# Patient Record
Sex: Male | Born: 1990 | Race: Black or African American | Hispanic: No | Marital: Single | Smoking: Current every day smoker
Health system: Southern US, Community
[De-identification: ages and names within clinical notes are randomized; demographics above are authoritative.]

## PROBLEM LIST (undated history)

## (undated) DIAGNOSIS — F319 Bipolar disorder, unspecified: Secondary | ICD-10-CM

## (undated) DIAGNOSIS — I10 Essential (primary) hypertension: Secondary | ICD-10-CM

## (undated) DIAGNOSIS — Z8619 Personal history of other infectious and parasitic diseases: Secondary | ICD-10-CM

## (undated) DIAGNOSIS — T7840XA Allergy, unspecified, initial encounter: Secondary | ICD-10-CM

## (undated) DIAGNOSIS — K219 Gastro-esophageal reflux disease without esophagitis: Secondary | ICD-10-CM

## (undated) DIAGNOSIS — F909 Attention-deficit hyperactivity disorder, unspecified type: Secondary | ICD-10-CM

## (undated) DIAGNOSIS — Z889 Allergy status to unspecified drugs, medicaments and biological substances status: Secondary | ICD-10-CM

## (undated) DIAGNOSIS — J45909 Unspecified asthma, uncomplicated: Secondary | ICD-10-CM

## (undated) HISTORY — DX: Bipolar disorder, unspecified: F31.9

## (undated) HISTORY — DX: Personal history of other infectious and parasitic diseases: Z86.19

## (undated) HISTORY — DX: Gastro-esophageal reflux disease without esophagitis: K21.9

## (undated) HISTORY — PX: ESOPHAGOGASTRODUODENOSCOPY: SHX1529

## (undated) HISTORY — DX: Unspecified asthma, uncomplicated: J45.909

## (undated) HISTORY — PX: TONSILLECTOMY AND ADENOIDECTOMY: SUR1326

## (undated) HISTORY — DX: Allergy, unspecified, initial encounter: T78.40XA

---
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
## 2010-01-21 ENCOUNTER — Emergency Department (HOSPITAL_COMMUNITY)
Admission: EM | Admit: 2010-01-21 | Discharge: 2010-01-22 | Payer: Self-pay | Source: Home / Self Care | Admitting: Emergency Medicine

## 2010-02-14 ENCOUNTER — Encounter: Payer: Self-pay | Admitting: Urology

## 2010-03-15 ENCOUNTER — Other Ambulatory Visit: Payer: Self-pay | Admitting: Otolaryngology

## 2010-03-15 ENCOUNTER — Ambulatory Visit (HOSPITAL_BASED_OUTPATIENT_CLINIC_OR_DEPARTMENT_OTHER)
Admission: RE | Admit: 2010-03-15 | Discharge: 2010-03-16 | Disposition: A | Discharge status: Home / Self Care | Payer: Medicaid Other | Source: Ambulatory Visit | Attending: Otolaryngology | Admitting: Otolaryngology

## 2010-03-15 DIAGNOSIS — J3501 Chronic tonsillitis: Secondary | ICD-10-CM | POA: Insufficient documentation

## 2010-03-15 LAB — POCT HEMOGLOBIN-HEMACUE: Hemoglobin: 13.5 g/dL (ref 13.0–17.0)

## 2010-03-16 NOTE — Op Note (Signed)
  Stephen Frank, COPE              ACCOUNT NO.:  [id number]  MEDICAL RECORD NO.:  [id number]           PATIENT TYPE:  LOCATION:                                 FACILITY:  PHYSICIAN:  Khyri Hinzman H. Pollyann Kennedy, MD     DATE OF BIRTH:  07-Dec-1990  DATE OF PROCEDURE:  03/15/2010 DATE OF DISCHARGE:                              OPERATIVE REPORT   PREOPERATIVE DIAGNOSIS:  Chronic tonsillitis.  POSTOPERATIVE DIAGNOSIS:  Chronic tonsillitis.  PROCEDURE:  Tonsillectomy.  SURGEON:  Kaytlen Lightsey H. Pollyann Kennedy, MD  General endotracheal anesthesia was used.  No complications.  BLOOD LOSS:  Minimal.  FINDINGS:  Very large inflamed tonsils with cryptic inflammation and severe enlargement.  SPECIMENS:  Tonsils sent bilaterally for pathologic evaluation.  HISTORY:  This is a 20 year old with a history of chronic tonsillitis. Risks, benefits, alternatives, complications of the procedure were explained to the patient and his mother who seemed to understand and agreed to surgery.  PROCEDURE:  The patient was taken to the operating room, placed on the operating table in supine position.  Following induction of general endotracheal anesthesia, the table was turned.  The patient was draped in a standard fashion.  Crowe-Davis mouthgag was inserted into the oral cavity, used to retract the tongue and mandible, attached to [employer]. Inspection of the palate revealed no evidence of abnormality.  Red rubber catheter was inserted into the right side of nose, withdrawn through the mouth and used to retract the soft palate and uvula. Tonsillectomy was performed using electrocautery dissection carefully dissecting the relatively avascular plane between the capsule and constrictor muscles.  This plane was difficult to identify in some areas, but with slow dissection I was able to avoid any significant bleeding.  The tonsils were sent together for pathologic evaluation.  Suction cautery was used to complete hemostasis.   The pharynx was irrigated with saline and suctioned.  An orogastric tube used to aspirate the contents of the stomach.  The patient was awakened, extubated, and transferred to recovery in stable condition.     Laretha Luepke H. Pollyann Kennedy, MD     JHR/MEDQ  D:  03/15/2010  T:  03/15/2010  Job:  [id number]  Electronically Signed by Serena Colonel MD on 03/16/2010 10:51:50 AM

## 2010-04-05 LAB — POCT I-STAT, CHEM 8
BUN: 8 mg/dL (ref 6–23)
Calcium, Ion: 1.08 mmol/L — ABNORMAL LOW (ref 1.12–1.32)
Chloride: 107 mEq/L (ref 96–112)
Creatinine, Ser: 1 mg/dL (ref 0.4–1.5)
Glucose, Bld: 94 mg/dL (ref 70–99)
HCT: 40 % (ref 39.0–52.0)
Hemoglobin: 13.6 g/dL (ref 13.0–17.0)
Potassium: 3.6 mEq/L (ref 3.5–5.1)
Sodium: 144 meq/L (ref 135–145)
TCO2: 28 mmol/L (ref 0–100)

## 2010-04-05 LAB — DIFFERENTIAL
Lymphs Abs: 3.3 10*3/uL (ref 0.7–4.0)
Monocytes Absolute: 0.6 10*3/uL (ref 0.1–1.0)
Monocytes Relative: 9 % (ref 3–12)

## 2010-04-05 LAB — CBC
HCT: 38.5 % — ABNORMAL LOW (ref 39.0–52.0)
Hemoglobin: 12.9 g/dL — ABNORMAL LOW (ref 13.0–17.0)
MCV: 84.2 fL (ref 78.0–100.0)
RBC: 4.57 MIL/uL (ref 4.22–5.81)
RDW: 12.9 % (ref 11.5–15.5)

## 2010-04-05 LAB — RAPID STREP SCREEN (MED CTR MEBANE ONLY): Streptococcus, Group A Screen (Direct): NEGATIVE

## 2010-04-26 LAB — POCT I-STAT, CHEM 8
BUN: 6 mg/dL (ref 6–23)
Calcium, Ion: 1.14 mmol/L (ref 1.12–1.32)
Chloride: 106 mEq/L (ref 96–112)
Creatinine, Ser: 0.8 mg/dL (ref 0.4–1.5)
Glucose, Bld: 94 mg/dL (ref 70–99)
HCT: 41 % (ref 39.0–52.0)
Hemoglobin: 13.9 g/dL (ref 13.0–17.0)
Sodium: 143 mEq/L (ref 135–145)

## 2010-06-11 NOTE — H&P (Signed)
NAME:  Stephen Frank, Stephen Frank                        ACCOUNT NO.:  [id number]   MEDICAL RECORD NO.:  [id number]                   PATIENT TYPE:  INP   LOCATION:  0699                                 FACILITY:  [facility]   PHYSICIAN:  Beverly Milch, MD                  DATE OF BIRTH:  [birth year]   DATE OF ADMISSION:  01/02/2003  DATE OF DISCHARGE:                         PSYCHIATRIC ADMISSION ASSESSMENT   PATIENT IDENTIFICATION:  This 20-year-old male seventh at [employer] is admitted voluntarily emergently as brought by his DSS  worker for inpatient stabilization of suicidal ideation and command auditory  hallucinations for the same.  He was suspended from school for three days,  apparently walking out of class and violating probation and still has legal  charges pending for fighting to be undertaken in court.  The patient is  under the care of [facility] and Dr. Idalia Needle.  He has completed a taper of Abilify, lithium, and Adderall XR as  of January 01, 2003, in preparation for the patient to enter [employer].  He apparently has to be medication free for a period time to enter  the camp.  Psychosocial coordination of such understanding with [facility] is undertaken as the patient's purpose may be to change this.   HISTORY OF PRESENT ILLNESS:  This is apparently the first psychiatric  hospitalization for the patient who has had extensive outpatient treatment  at [facility].  He has been on pharmacotherapy since February 19, 2001,  reportedly restarting Zyprexa on that date, according to records received.  The patient has taken multiple medications as mother and social worker  outline at the time of admission.  He has received, in the past, Risperdal,  Zyprexa, and Abilify with Abilify up to 15 mg daily and Zyprexa 10 mg.  The  patient has received mood stabilizers including Depakote, lithium, and  Topamax.  He has also  received Effexor, Zoloft, and Adderall.  I cannot  determine that he has had Strattera, Prozac, Seroquel, Concerta, or Provigil  though records may only be partially provided.  At the time of presentation,  the patient is somewhat obsessively driven by parents' anxiety to figure  things out in the environment and to get them done.  The patient does not  interpret the reason but seems to need to do so to be comfortable.  He and  mother otherwise provide little elaboration on more chronic symptoms.  They  do note that he was suspended from school on the day of admission and they  report that he had a knife with a plan to cut himself.  He also voiced  suicidal ideation and reported auditory hallucinations telling him to kill  himself and do bad things.  He seemed to report most of these extreme  symptoms prior to arrival.  DSS confirms that they have custody.  The  patient was taken from biological mother approximately one year ago after  neglect and placed with grandmother.  However, despite the placement, the  patient has been to juvenile apparently approximately a week ago.  He is  referred with the diagnosis of conduct disorder.  The patient offers little  spontaneous elaboration on symptoms, reasons, or mechanisms.  The patient  will not clarify his legal status completely but apparently he has legal  charges still to be processed in court for fighting and is on probation for  the same.  He has apparently violated his probation by being suspended from  school, which apparently was for walking out of class though he is also  disrespectful to the teachers frequently and fights frequently.  The patient  makes comments such as he acts out when he gets nervous or school is  stressful.  Though he does not follow directions, they do not prevent cruel  or complete failure of empathy behaviors.  They note that grounding and  privileges being removed are ways they use at home to contain the  patient.  He does like sports and pets.  The patient acts out at school as well as at  home and he may tear things up sometimes but not frequently.  The patient  does not acknowledge any substance use including no cigarettes.  His  therapist is Risa Grill.  His psychiatrist is Dr. Idalia Needle.  His Arts development officer with DSS is Control and instrumentation engineer.  His most recent Abilify dose was 15  mg nightly before the taper while lithium was 600 mg twice daily and  Adderall was 30 mg XR daily.  Dr. Jasmine Awe office confirms by phone and fax  that the patient needs to be off of his medications for starting [facility] and they prefer that he not be restarted on any medications at this  time as conduct disorder is his primary diagnosis.   PAST MEDICAL HISTORY:  The patient has a history of allergic rhinitis and  asthma.  He does take Clarinex 5 mg every morning, albuterol three puffs  b.i.d. p.r.n., and Advair 100/50 mg daily.  He also takes a  multivitamin/multimineral daily.  The patient is overweight.  He is allergic  to soybeans and pecans.  He has no medication allergies.  He has no history  of syncope or definite head trauma though he states he might get a seizure  if he tries to take a cold shower here.  He denies chest pain, cough, or  syncope.   REVIEW OF SYSTEMS:  The patient denies difficulty with gait, gaze, or  continence.  He denies exposure to communicable disease or toxins.  He  denies rash, jaundice, or purpura currently.  He is overweight.  He denies  abdominal pain, nausea, vomiting, or diarrhea.  There is no dysuria or  arthralgia.   Immunizations are up-to-date.   PHYSICAL EXAMINATION:  VITAL SIGNS:  Height is 59.75 inches and weight 138  pounds.  Blood pressure is 122/79 with heart rate 94 supine and standing  blood pressure is 129/70 with heart rate 96.  NEUROLOGIC:  The patient is alert and oriented with gait and gaze.  Cranial nerves appear intact.  Reflexes, muscle  strength and tone, and sensory exam  seem intact.  There are no abnormal involuntarily movements.   FAMILY HISTORY:  They do not provide significant family history.  They do  note that the patient was removed from mother's custody one year ago by DSS  and apparently placed with grandmother.  The patient apparently had to go to  juvenile jail approximately one to two weeks ago.  They do acknowledge that  he has been disrespectful and relatively assaultive.  He acts out often at  times that he is stressed or nervous.  He does not acknowledge definite  posttraumatic flashbacks but he does seem perfectionistic and fixated.  He  does not acknowledge using cigarettes, alcohol, or illicit drugs.  He does  not acknowledge sexual activity.   MENTAL STATUS EXAM:  The patient is moderately inattentive and seems  ritualistic and rigid in his interpersonal and activities of daily living  style.  He seems perfectionistic in his need to ask for directions and then  to appraise the environment around him.  He tends to assume a legalistic  posture, particularly with peers.  He is not angry or irritable at this time  but is dependent and fixated.  He appears to have comorbid ADHD symptoms and  anxiety disorder with OCD features.  Oppositional defiance or conduct  disorder is also evident though it is difficult to clarify objectively  descriptively at this time.  The patient does not present entrenched  sociopathy.  He does have some capacity for empathy though he is rigid in  style.  He does not acknowledge hallucinations to me but has been reporting  auditory hallucinations, particularly telling him to do something bad or  hurt himself.  These seem to arise in obsessive fixations and disruptive  behavior.  He is in a class for behavioral and emotional disorders.  He has  had active suicidal ideation including reportedly having a knife.  He has  been assaultive at times but is not homicidal.   ADMISSION  DIAGNOSES:   AXIS I:  1. 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  2. 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  3. Attention-deficit hyperactivity disorder, combined type, moderate to     severe.  4. Parent-child problem.  5. Other specified family circumstances.  6. Other interpersonal problems.  7. Acutely completed medication taper.   AXIS II:  Diagnosis deferred.   AXIS III:  1. Allergic rhinitis and asthma.  2. Overweight.  3. Sensitive to pecans and soybeans.   AXIS IV:  Stressors: Family- severe to extreme, acute and chronic; school-  severe, acute and chronic.   AXIS V:  Global assessment of functioning at the time of admission 44 with  highest global assessment of functioning in the last year 56.   ASSETS AND STRENGTHS:  The patient's perfectionism seems an asset for  inroads into his defiant behavior.  INITIAL PLAN OF CARE:  The patient is admitted for inpatient child  psychiatric and multidisciplinary multimodal behavioral health treatment in  the team based program at a locked psychiatric unit.  Cognitive behavioral,  anger management, family, and exposure/response prevention therapies can be  undertaken.  Options for pharmacotherapy would include Strattera, Seroquel,  and Prozac if appropriate.  However, if the patient is to be off of  pharmacotherapy and to prepare for [facility], medications will not be  started.  Will assess for symptom stabilization at this time.   ESTIMATED LENGTH OF STAY:  Five to seven days.   CONDITIONS NECESSARY FOR DISCHARGE:  Target symptoms for discharge include  stabilization of suicide risk and any command hallucinations, stabilization  of disruptive behavior and obsessive anxiety, and generalization of capacity  for safe, effective participation  in subsequent treatment.                                               Beverly Milch, MD    GJ/MEDQ  D:  01/03/2003  T:  01/03/2003  Job:  [id number]

## 2010-06-11 NOTE — Discharge Summary (Signed)
NAME:  Stephen Frank, Stephen Frank                        ACCOUNT NO.:  [id number]   MEDICAL RECORD NO.:  [id number]                   PATIENT TYPE:  INP   LOCATION:  0600                                 FACILITY:  [facility]   PHYSICIAN:  Beverly Milch, MD                  DATE OF BIRTH:  15-Sep-1990   DATE OF ADMISSION:  01/02/2003  DATE OF DISCHARGE:  01/10/2003                                 DISCHARGE SUMMARY   IDENTIFICATION:  20-year-old male, 7th grade student at [employer] was admitted voluntarily as brought by his DSS worker for  inpatient stabilization of suicidal ideation and command auditory  hallucinations. He had been suspended from school for 3 days for walking out  of class and violating his probation.  His repeated failure in his programs  for containment of his disruptive behavior that resulted in withdrawal of  all of his medications at [facility] in anticipation of  starting a wilderness camp program.  The patient has had significant  outpatient treatment over time at [facility] and has been  under the care for medication management with Dr. Idalia Needle.  For full  details please see the typed admission assessment.   SYNOPSIS OF PRESENT ILLNESS:  The patient had been recently discontinued by  taper from Abilify, lithium and Adderall XR as of January 01, 2003.  The  patient was otherwise sincere or fully mindful of the approaching wilderness  camp.  The patient tends to be significantly anxious and obsessive including  in his denial.  He had received multiple medications in the past, including  Risperdal, Zyprexa, Depakote, Topamax, Effexor, Zoloft, and those  medications from admission including Abilify, lithium and Adderall.  The  patient did not definitely receive Strattera, Prozac, Seroquel, Concerta or  Provigil.  The patient was suspended from school apparently on the day of  admission and was hospitalized  reporting that he had a knife with a plan to  cut himself.  He reported suicidal ideation with command auditory  hallucinations telling him to kill himself and do bad things.  He is  apparently under the custody of DSS, currently staying with grandmother  after he was removed from the custody of mother.  The patient has a  diagnosis of conduct disorder though he does not have a repertoire of  delinquent behaviors as much as he has repeated disruptions of the efforts  of others to help him, as well as every day rules at school and in the  community.  He fights frequently at school though generally for the course  of simple arguments.  His DSS social worker is Control and instrumentation engineer and his  therapist if Risa Grill.  He has allergic rhinitis and asthma for which he  takes Clarinex, albuterol and Advair.  He is overweight.  He had to go to  juvenile jail 1-2 weeks ago and has  been disrespectful and assaultive at  times.  He states he acts out when he is stressed or nervous.  He does not  acknowledge definite post-traumatic flashbacks.  He has no substance use.   INITIAL MENTAL STATUS EXAM:  The patient was moderately inattentive and  seemed ritualistically rigid in his interpersonal style, including that of  becoming argumentative.  He was always checking and double checking from an  almost legalistic posture, particularly with peers.  He appeared to have co  morbid ADHD and anxiety symptoms, though not meeting full criteria for  obsessive-compulsive disorder.  He had more oppositional and defiant than  conduct disorder features.  He did have empathy for others though he is  rigid in his application of such.  He had no further hallucinations after  his admission, these seeming to arise as part of his obsessive fixations and  disruptiveness.  He was not homicidal.   LABORATORY FINDINGS:  CBC on admission was normal except he had slight  lymphocytosis with 55% lymphocytes and 33% neutrophils and his  eosinophils  were slightly elevated at 6 with upper limit of normal 5%.  White count was  normal at 5,800, hemoglobin 12.3, MCV 82 and platelet count 303,000.  Basic  metabolic panel was normal, with sodium 139, potassium 4, glucose 97,  creatinine 0.8, and calcium 10.  Hepatic function panel was normal, with  SGOT 27, SGPT 28, GGT 23 and albumin 4.  Urine drug screen was negative.  Urinalysis was normal with specific gravity 1.016.   HOSPITAL COURSE AND TREATMENT:  General medical exam on admission by [facility], PA-C noted that the patient was significantly overweight.  He  had no allergies.  He does use multiple medications as mentioned above for  asthma.  He had an old scar on the left wrist and right knee and reported  some headaches associated with allergies.  Main risk factor was being  overweight.  Vital signs were stable throughout hospital stay.  Admission  weight was 138 pounds with height of 59-3/4 inches, blood pressure 122/79  with heart rate of 94 supine and standing blood pressure 129/70 with heart  rate 96.  Final weight was 130 pounds.  Final blood pressure was 110/60 with  heart rate of 93 supine and 108/61 with heart rate of 122 standing.  Psychosocial coordination of the patient's inpatient treatment, particularly  pharmacologically with the underway outpatient was attempted in every way  possible.  His medications were therefore not restructured for ADHD and  anxiety, though he did receive some p.r.n. medications on several occasions,  including Zyprexa 10 mg as the Zydis formulation and Seroquel 100-200 mg.  His total medication dose during the hospital stay p.r.n. was 20 mg of  Zyprexa Zydis and 400 mg of Seroquel.  He received no scheduled medications  including at the time of discharge.  Behavioral therapy was carried out  among other therapies, including group, milieu, family, individual, special education, occupational and therapeutic recreational  therapies and anger  management.  Final family session included mother, grandmother and Vance Peper.  There will be a treatment planning meeting the following week to  finalize decisions about next steps in the patient's treatment.  The patient  addressed repeatedly during his hospital stay the needs for the wilderness  camp treatment, but also the treatable elements of his obsessive anxiety and  ADHD.  The patient addressed in the final case conference what he had  learned and his specific goals and  how he could reach them.  The patient  made a significant improvement in the final 36-48 hours of his hospital  stay.  He became more sincere and less competitive in his disruptiveness.  The patient was discharged in improved condition.  He had no further  suicidal ideation.  His anger management and family themes were reworked  repeatedly during his hospital stay.   FINAL DIAGNOSES:  AXIS 1:  1. Oppositional-defiant disorder.  2. Anxiety disorder not otherwise specified, with obsessive-compulsive and     probable post-traumatic features.  3. Attention deficit hyperactivity disorder, combined type, moderate to     severe.  4. Parent-child problem.  5. Other specified family circumstances.  6. Other interpersonal problems.  7. Acutely completed medication taper.  AXIS II:  Diagnosis deferred.  AXIS III:  1. Allergic rhinitis and asthma.  2. Overweight.  3. Sensitive to pecans and soybeans.  AXIS IV:  Stressors:  Family - severe to extreme, acute and chronic; school - severe,  acute and chronic.  AXIS V:  Global assessment of function on admission 44 with highest in last year 56  and discharge global assessment of function was 52.   PLAN:  The patient was making progress at the time of discharge.  He was  motivated to work with DSS and school.  He did receive his asthma and  allergy medications during his hospital stay, including be provided a  Nasonex spray.  He was discharged on  the following medications:  1. Albuterol inhaler 2 puffs every 4 hours as needed for asthma.  2. Advair 100/50 to use 1 puff twice daily.  3. Clarinex 5 mg every morning.  4. Nasonex 2 sprays each nostril every morning.  5. Multivitamin multimineral, 1 daily.  6. Hydrocortisone cream can be used on patch of nummular eczema of the right     distal lateral thigh.  The patient was not discharged on any psychotropic medications.  Prozac and  Concerta may help his obsessive anxiety and ADHD if he does not have to go  to the wilderness camp for his oppositional-defiance.  Crisis and safety  plans are established if needed.  He will see Risa Grill at [employer] [facility] for  [facility] January 11, 2003 at 0900 for therapy.                                               Beverly Milch, MD    GJ/MEDQ  D:  01/13/2003  T:  01/13/2003  Job:  [id number]   cc:   Attn:  [facility]  [address] [place], [place] [address]   Al Decant. Marlou Porch, M.D.

## 2010-06-11 NOTE — Op Note (Signed)
Stephen Frank, WOOLSEY              ACCOUNT NO.:  [id number]   MEDICAL RECORD NO.:  [id number]          PATIENT TYPE:  AMB   LOCATION:  SDS                          FACILITY:  [facility]   PHYSICIAN:  Jefry H. Pollyann Kennedy, MD     DATE OF BIRTH:  [birth year]   DATE OF PROCEDURE:  02/25/2005  DATE OF DISCHARGE:                                 OPERATIVE REPORT   PREOPERATIVE DIAGNOSIS:  Hyperplasia of the adenoid with obstruction.   POSTOPERATIVE DIAGNOSIS:  Hyperplasia of the adenoid with obstruction.   PROCEDURE:  Adenoidectomy.   SURGEON:  Jefry H. Pollyann Kennedy, MD.   General endotracheal anesthesia was used.  No complications.   BLOOD LOSS:  40 mL.   No complications.   FINDINGS:  Severe enlargement of the adenoid with complete obstruction of  the nasopharynx.  Mild to moderate enlargement of the tonsil with cryptic  debris present.   REFERRING PHYSICIAN:  Linward Headland, M.D.   HISTORY:  A 20 year old with a history of severe snoring and nasal  obstruction.  Risks, benefits, alternatives, complications of the procedure  were explained to the mother, she understands and agrees to surgery.   PROCEDURE:  The patient was taken to the operating room and placed on the  operating table in the supine position.  Following induction of general  endotracheal anesthesia, the table was turned.  The patient was draped in  the standard fashion.  A Crowe-Davis mouthgag was inserted into the oral  cavity, used to retract the tongue and mandible and attach the Mayo stand.  Inspection of the palate revealed no evidence of a submucous cleft or  shortening of the soft palate.  Indirect examination of the nasopharynx was  performed and a large adenoid curet was used in multiple passes to remove  the majority of the adenoid tissue.  The nasopharynx was packed for several  minutes for hemostasis.  The packing was removed and suction cautery was  used to obliterate additional lymphoid  tissue and to provide  hemostasis.  The pharynx was suctioned of blood and  secretions, irrigated with saline solution and the orogastric tube was used  to aspirate the contents of the stomach.  Patient was then awakened,  extubated and transferred to recovery in stable condition.      Jefry H. Pollyann Kennedy, MD  Electronically Signed     JHR/MEDQ  D:  02/25/2005  T:  02/25/2005  Job:  [id number]   cc:   Linward Headland, M.D.  Fax: [phone]

## 2010-10-17 ENCOUNTER — Emergency Department (HOSPITAL_COMMUNITY): Payer: No Typology Code available for payment source

## 2010-10-17 ENCOUNTER — Emergency Department (HOSPITAL_COMMUNITY)
Admission: EM | Admit: 2010-10-17 | Discharge: 2010-10-17 | Disposition: A | Discharge status: Home / Self Care | Payer: No Typology Code available for payment source | Attending: Emergency Medicine | Admitting: Emergency Medicine

## 2010-10-17 ENCOUNTER — Encounter (HOSPITAL_COMMUNITY): Payer: Self-pay

## 2010-10-17 DIAGNOSIS — M549 Dorsalgia, unspecified: Secondary | ICD-10-CM | POA: Insufficient documentation

## 2010-10-17 DIAGNOSIS — J45909 Unspecified asthma, uncomplicated: Secondary | ICD-10-CM | POA: Insufficient documentation

## 2010-10-17 DIAGNOSIS — Y92410 Unspecified street and highway as the place of occurrence of the external cause: Secondary | ICD-10-CM | POA: Insufficient documentation

## 2010-10-17 DIAGNOSIS — Y992 Volunteer activity: Secondary | ICD-10-CM | POA: Insufficient documentation

## 2010-10-17 DIAGNOSIS — R4182 Altered mental status, unspecified: Secondary | ICD-10-CM | POA: Insufficient documentation

## 2010-10-17 DIAGNOSIS — F909 Attention-deficit hyperactivity disorder, unspecified type: Secondary | ICD-10-CM | POA: Insufficient documentation

## 2010-10-17 DIAGNOSIS — T07XXXA Unspecified multiple injuries, initial encounter: Secondary | ICD-10-CM | POA: Insufficient documentation

## 2010-10-17 HISTORY — DX: Allergy status to unspecified drugs, medicaments and biological substances: Z88.9

## 2010-10-17 HISTORY — DX: Attention-deficit hyperactivity disorder, unspecified type: F90.9

## 2010-10-17 LAB — COMPREHENSIVE METABOLIC PANEL
ALT: 27 U/L (ref 0–53)
AST: 20 U/L (ref 0–37)
Albumin: 4 g/dL (ref 3.5–5.2)
Alkaline Phosphatase: 95 U/L (ref 39–117)
Calcium: 9 mg/dL (ref 8.4–10.5)
GFR calc Af Amer: >60 mL/min (ref >60)
Glucose, Bld: 103 mg/dL — ABNORMAL HIGH (ref 70–99)
Potassium: 3.6 mEq/L (ref 3.5–5.1)
Sodium: 142 mEq/L (ref 135–145)
Total Protein: 7.1 g/dL (ref 6.0–8.3)

## 2010-10-17 LAB — PROTIME-INR
INR: 1 (ref 0.00–1.49)
Prothrombin Time: 13.4 seconds (ref 11.6–15.2)

## 2010-10-17 LAB — URINALYSIS, ROUTINE W REFLEX MICROSCOPIC
Bilirubin Urine: NEGATIVE
Glucose, UA: NEGATIVE mg/dL
Ketones, ur: NEGATIVE mg/dL
Protein, ur: NEGATIVE mg/dL
pH: 6 (ref 5.0–8.0)

## 2010-10-17 LAB — CBC
MCH: 28.8 pg (ref 26.0–34.0)
MCHC: 33.9 g/dL (ref 30.0–36.0)
MCV: 84.9 fL (ref 78.0–100.0)

## 2010-10-17 LAB — RAPID URINE DRUG SCREEN, HOSP PERFORMED
Amphetamines: NOT DETECTED
Barbiturates: NOT DETECTED
Benzodiazepines: NOT DETECTED
Tetrahydrocannabinol: POSITIVE — AB

## 2010-10-17 LAB — LACTIC ACID, PLASMA: Lactic Acid, Venous: 1 mmol/L (ref 0.5–2.2)

## 2010-10-17 MED ORDER — IOHEXOL 300 MG/ML  SOLN
100.0000 mL | Freq: Once | INTRAMUSCULAR | Status: AC | PRN
  Administered 2010-10-17: 100 mL via INTRAVENOUS

## 2010-10-20 ENCOUNTER — Emergency Department (HOSPITAL_COMMUNITY)
Admission: EM | Admit: 2010-10-20 | Discharge: 2010-10-20 | Disposition: A | Discharge status: Home / Self Care | Payer: Medicaid Other | Attending: Emergency Medicine | Admitting: Emergency Medicine

## 2010-10-20 DIAGNOSIS — S139XXA Sprain of joints and ligaments of unspecified parts of neck, initial encounter: Secondary | ICD-10-CM | POA: Insufficient documentation

## 2010-10-20 DIAGNOSIS — F909 Attention-deficit hyperactivity disorder, unspecified type: Secondary | ICD-10-CM | POA: Insufficient documentation

## 2010-10-20 DIAGNOSIS — R51 Headache: Secondary | ICD-10-CM | POA: Insufficient documentation

## 2010-10-20 DIAGNOSIS — M542 Cervicalgia: Secondary | ICD-10-CM | POA: Insufficient documentation

## 2010-10-20 DIAGNOSIS — J45909 Unspecified asthma, uncomplicated: Secondary | ICD-10-CM | POA: Insufficient documentation

## 2011-03-06 ENCOUNTER — Encounter (HOSPITAL_COMMUNITY): Payer: Self-pay

## 2011-03-06 ENCOUNTER — Emergency Department (INDEPENDENT_AMBULATORY_CARE_PROVIDER_SITE_OTHER)
Admission: EM | Admit: 2011-03-06 | Discharge: 2011-03-06 | Disposition: A | Discharge status: Home / Self Care | Payer: Medicaid Other | Source: Home / Self Care | Attending: Family Medicine | Admitting: Family Medicine

## 2011-03-06 DIAGNOSIS — Z720 Tobacco use: Secondary | ICD-10-CM

## 2011-03-06 DIAGNOSIS — J40 Bronchitis, not specified as acute or chronic: Secondary | ICD-10-CM

## 2011-03-06 DIAGNOSIS — J410 Simple chronic bronchitis: Secondary | ICD-10-CM

## 2011-03-06 DIAGNOSIS — J329 Chronic sinusitis, unspecified: Secondary | ICD-10-CM

## 2011-03-06 MED ORDER — DOXYCYCLINE HYCLATE 100 MG PO CAPS: 100.0000 mg | ORAL_CAPSULE | Freq: Two times a day (BID) | ORAL | Status: AC

## 2011-03-06 NOTE — ED Notes (Signed)
Pt states he has had a sore throat for 2 weeks, states tongue and glands feel swollen , denies fever/n/v

## 2011-03-06 NOTE — ED Provider Notes (Signed)
History     CSN: [id number]  Arrival date & time 03/06/11  1108   First MD Initiated Contact with Patient 03/06/11 1220      Chief Complaint  Patient presents with  . Sore Throat    sore throat, swollen glands for 2 weeks    (Consider location/radiation/quality/duration/timing/severity/associated sxs/prior treatment) Patient is a 21 y.o. male presenting with pharyngitis. The history is provided by the patient.  Sore Throat This is a new problem. The current episode started more than 1 week ago (2 week ). The problem has been gradually worsening. Pertinent negatives include no chest pain, no abdominal pain and no shortness of breath. Associated symptoms comments: Smoker, , productive of phlegm.. The symptoms are aggravated by swallowing.    Past Medical History  Diagnosis Date  . Asthma   . History of seasonal allergies   . ADHD (attention deficit hyperactivity disorder)     Past Surgical History  Procedure Date  . Tonsillectomy     History reviewed. No pertinent family history.  History  Substance Use Topics  . Smoking status: Not on file  . Smokeless tobacco: Not on file  . Alcohol Use:       Review of Systems  Constitutional: Negative.   HENT: Positive for congestion, sore throat, rhinorrhea and postnasal drip.   Respiratory: Positive for cough. Negative for shortness of breath.   Cardiovascular: Negative for chest pain.  Gastrointestinal: Negative for abdominal pain.  Skin: Negative.     Allergies  Review of patient's allergies indicates no known allergies.  Home Medications   Current Outpatient Rx  Name Route Sig Dispense Refill  . DOXYCYCLINE HYCLATE 100 MG PO CAPS Oral Take 1 capsule (100 mg total) by mouth 2 (two) times daily. 30 capsule 0    BP 139/85  Pulse 78  Temp(Src) 98.3 F (36.8 C) (Oral)  Resp 16  SpO2 99%  Physical Exam  Nursing note and vitals reviewed. Constitutional: He is oriented to person, place, and time. He appears  well-developed and well-nourished.  HENT:  Head: Normocephalic.  Right Ear: External ear normal.  Left Ear: External ear normal.  Nose: Nose normal.  Mouth/Throat: Oropharynx is clear and moist.  Eyes: Pupils are equal, round, and reactive to light.  Neck: Normal range of motion. Neck supple.  Cardiovascular: Normal rate, normal heart sounds and intact distal pulses.   Pulmonary/Chest: He has rhonchi.  Lymphadenopathy:    He has cervical adenopathy.  Neurological: He is alert and oriented to person, place, and time.  Skin: Skin is warm and dry.    ED Course  Procedures (including critical care time)   Labs Reviewed  POCT RAPID STREP A ([facility] CARE ONLY)   No results found.   1. Sinusitis   2. Bronchitis due to tobacco use       MDM          Barkley Bruns, MD 03/06/11 [phone]

## 2011-12-14 ENCOUNTER — Encounter: Payer: Self-pay | Admitting: Family Medicine

## 2011-12-14 ENCOUNTER — Ambulatory Visit (INDEPENDENT_AMBULATORY_CARE_PROVIDER_SITE_OTHER): Payer: Medicaid Other | Admitting: Family Medicine

## 2011-12-14 ENCOUNTER — Telehealth: Payer: Self-pay | Admitting: Family Medicine

## 2011-12-14 ENCOUNTER — Ambulatory Visit: Payer: Medicaid Other | Admitting: Family Medicine

## 2011-12-14 VITALS — BP 163/89 | HR 82 | Temp 98.3°F | Ht 71.0 in | Wt 231.0 lb

## 2011-12-14 DIAGNOSIS — F319 Bipolar disorder, unspecified: Secondary | ICD-10-CM | POA: Insufficient documentation

## 2011-12-14 DIAGNOSIS — J029 Acute pharyngitis, unspecified: Secondary | ICD-10-CM | POA: Insufficient documentation

## 2011-12-14 MED ORDER — GUAIFENESIN-CODEINE 100-10 MG/5ML PO SYRP: 10.0000 mL | ORAL_SOLUTION | Freq: Three times a day (TID) | ORAL | Status: DC | PRN

## 2011-12-14 MED ORDER — ACETAMINOPHEN-DM 1000-30 MG/30ML PO LIQD: 15.0000 mL | Freq: Three times a day (TID) | ORAL | Status: DC | PRN

## 2011-12-14 NOTE — Patient Instructions (Addendum)
It was nice to meet you today, Stephen Frank. For sore throat, please pick up medication at your pharmacy. Purchase over the counter CEPACOL cough drops/lozenges to reduce throat pain. If you develop fever (temp > 101), chills, nausea/vomiting, or worsening sore throat, please return to clinic.  Sore Throat A sore throat is felt inside the throat and at the back of the mouth. It hurts to swallow or the throat may feel dry and scratchy. It can be caused by germs, smoking, pollution, or allergies.  HOME CARE   Only take medicine as told by your doctor.  Drink enough fluids to keep your pee (urine) clear or pale yellow.  Eat soft foods.  Do not smoke.  Rinse the mouth (gargle) with warm water or salt water ( teaspoon salt in 8 ounces of water).  Try throat sprays, lozenges, or suck on hard candy. GET HELP RIGHT AWAY IF:   You have trouble breathing.  Your sore throat lasts longer than 1 week.  There is more puffiness (swelling) in the throat.  The pain is so bad that you are unable to swallow.  You have a very bad headache or a red rash.  You start to throw up (vomit).  You or your child has a temperature by mouth above 102 F (38.9 C), not controlled by medicine.  Your baby is older than 3 months with a rectal temperature of 102 F (38.9 C) or higher.  Your baby is 31 months old or younger with a rectal temperature of 100.4 F (38 C) or higher. MAKE SURE YOU:   Understand these instructions.  Will watch your condition.  Will get help right away if you are not doing well or get worse. Document Released: 10/20/2007 Document Revised: 04/04/2011 Document Reviewed: 10/20/2007 [facility] Patient Information 2013 [place], [place].

## 2011-12-14 NOTE — Telephone Encounter (Signed)
Patient is calling because he went to [facility] on [address] to pick up his Rx, but they said it wasn't there yet.  I asked him to give it another hour and check back again.

## 2011-12-14 NOTE — Assessment & Plan Note (Signed)
Likely early viral URI - afebrile in office today. - Treat symptoms with Robitussin with codeine, Cepacol lozenges, rest and fluids - Red flags reviewed with patient and per AVS

## 2011-12-14 NOTE — Progress Notes (Signed)
  Subjective:    Patient ID: Stephen Frank, male    DOB: 06-12-1990, 21 y.o.   MRN: [id number]  HPI  Patient presents to clinic to establish care.  He was previously seen by a pediatrician until one year ago.  Today, patient complains of sore throat.  Sore throat: Patient has had sore throat x 2 days.  He slept with the window open 2 nights ago and thinks this may have caused symptoms.  Complains of pain with swallowing only and burning back of tongue.  Denies any rhinorrhea, cough, fever, chills, or NS.  Denies any SOB or CP.  Patient has not tried anything yet for pain.  Of note, he has had both tonsils and adenoids removed.  Hx of bipolar disorder:  Patient was diagnosed with Bipolar disorder when he was in elementary school.  He used to take Abilify, but he stopped taking this about 2 years ago.  He also used to take Adderral for ADHD, but he stopped this 2 years ago as well.  Patient used to see a psychiatrist (or psychologist) back then but has stopped going since he has been out of school.  Patient denies any recent mood changes.  He says he has been doing well off medication.  Does admit to [facility] use.  Denies family hx of bipolar DO.  I have reviewed and update patient's PMH, SH, Allergies, Medications, FH, and Problem List.  Review of Systems  Per HPI    Objective:   Physical Exam  Constitutional: No distress.  HENT:  Head: Normocephalic and atraumatic.  Mouth/Throat: No oropharyngeal exudate.  Neck:       Tender lymph node on RT  Cardiovascular: Normal rate.   Pulmonary/Chest: Effort normal and breath sounds normal. He has no wheezes. He has no rales.        Assessment & Plan:

## 2011-12-14 NOTE — Assessment & Plan Note (Signed)
Patient has not been taking medications in about 2 years.  Will discuss psych issues at follow up appointment.

## 2011-12-16 ENCOUNTER — Ambulatory Visit (INDEPENDENT_AMBULATORY_CARE_PROVIDER_SITE_OTHER): Payer: Medicaid Other | Admitting: Family Medicine

## 2011-12-16 ENCOUNTER — Encounter: Payer: Self-pay | Admitting: Family Medicine

## 2011-12-16 ENCOUNTER — Other Ambulatory Visit (HOSPITAL_COMMUNITY)
Admission: RE | Admit: 2011-12-16 | Discharge: 2011-12-16 | Disposition: A | Discharge status: Home / Self Care | Payer: Medicaid Other | Source: Ambulatory Visit | Attending: Family Medicine | Admitting: Family Medicine

## 2011-12-16 VITALS — BP 151/81 | HR 89 | Temp 98.9°F | Ht 71.0 in | Wt 225.0 lb

## 2011-12-16 DIAGNOSIS — Z113 Encounter for screening for infections with a predominantly sexual mode of transmission: Secondary | ICD-10-CM | POA: Insufficient documentation

## 2011-12-16 DIAGNOSIS — R10A0 Flank pain, unspecified side: Secondary | ICD-10-CM

## 2011-12-16 DIAGNOSIS — R109 Unspecified abdominal pain: Secondary | ICD-10-CM | POA: Insufficient documentation

## 2011-12-16 DIAGNOSIS — Z23 Encounter for immunization: Secondary | ICD-10-CM

## 2011-12-16 LAB — POCT URINALYSIS DIPSTICK
Blood, UA: NEGATIVE
Protein, UA: 30
Spec Grav, UA: 1.025
Urobilinogen, UA: 0.2

## 2011-12-16 LAB — POCT UA - MICROSCOPIC ONLY

## 2011-12-16 MED ORDER — CYCLOBENZAPRINE HCL 5 MG PO TABS: 5.0000 mg | ORAL_TABLET | Freq: Three times a day (TID) | ORAL | Status: DC | PRN

## 2011-12-16 MED ORDER — DICLOFENAC SODIUM 75 MG PO TBEC: 75.0000 mg | DELAYED_RELEASE_TABLET | Freq: Two times a day (BID) | ORAL | Status: DC

## 2011-12-16 NOTE — Progress Notes (Signed)
  Subjective:    Patient ID: Stephen Frank, male    DOB: [birth year], 21 y.o.   MRN: [id number]  HPI  21 year old presents with Low back pain x 2 months.  No injury or trauma.  Located low back pain and radiates from side to side.  Pain comes and goes.  Worse with drinking regular sodas only.  No limited range of motion.  Does not take any OTC analgesics.  Does not wake up him at night.  Denies any numbness/tingling of extremities.  No loss of control of bladder or bowels.  Is sexually active, does not always use condoms, but patient says "I am sure I do not have an STD."  Denies any dysuria.  Denies any blood in urine.  Denies any urethral discharge.  Denies hx of kidney stones.  Denies any fever, chills, nausea/vomiting.   Review of Systems Per HPI    Objective:   Physical Exam  Constitutional: He appears well-nourished. No distress.  Musculoskeletal:       Tenderness on palpation of lumbar paraspinal muscles, no bony tenderness, no redness or swelling or skin tissue texture changes, full ROM with flexion and extension  Neurological: He is alert. No cranial nerve deficit. Coordination normal.       Assessment & Plan:

## 2011-12-16 NOTE — Patient Instructions (Addendum)
It was good to see you again, Stephen Frank. If any results come back abnormal, I will call you. For back and side pain, please take muscle relaxer and pain medication as directed. If pain does not improve in 2-3 weeks, please call your doctor. If you develop numbness/tingling of extremities, worsening pain, bloody urine, or loss of bowel or bladder function, please return to clinic or report to ER.

## 2011-12-16 NOTE — Assessment & Plan Note (Addendum)
Flank pain is bilateral, less likely kidney stone but will check UA today.  May be secondary to muscle spasms. - Check UA and urine gonorrhea/chlamydia - For pain, Diclofenac 75 BID and Flexeril TID PRN  - Return to clinic if no improvement in 2-3 weeks - Red flags reviewed

## 2011-12-20 ENCOUNTER — Telehealth: Payer: Self-pay | Admitting: Family Medicine

## 2011-12-20 NOTE — Telephone Encounter (Signed)
LVM informed normal GC/Chlamydia

## 2012-04-25 ENCOUNTER — Ambulatory Visit (INDEPENDENT_AMBULATORY_CARE_PROVIDER_SITE_OTHER): Payer: Medicaid Other | Admitting: Family Medicine

## 2012-04-25 ENCOUNTER — Encounter: Payer: Self-pay | Admitting: Family Medicine

## 2012-04-25 VITALS — BP 140/70 | HR 83 | Temp 98.1°F | Ht 71.0 in | Wt 220.0 lb

## 2012-04-25 DIAGNOSIS — Z2089 Contact with and (suspected) exposure to other communicable diseases: Secondary | ICD-10-CM

## 2012-04-25 DIAGNOSIS — R1013 Epigastric pain: Secondary | ICD-10-CM

## 2012-04-25 DIAGNOSIS — K219 Gastro-esophageal reflux disease without esophagitis: Secondary | ICD-10-CM | POA: Insufficient documentation

## 2012-04-25 DIAGNOSIS — Z202 Contact with and (suspected) exposure to infections with a predominantly sexual mode of transmission: Secondary | ICD-10-CM

## 2012-04-25 DIAGNOSIS — G8929 Other chronic pain: Secondary | ICD-10-CM

## 2012-04-25 HISTORY — DX: Gastro-esophageal reflux disease without esophagitis: K21.9

## 2012-04-25 MED ORDER — OMEPRAZOLE 20 MG PO CPDR: 20.0000 mg | DELAYED_RELEASE_CAPSULE | Freq: Every day | ORAL | Status: DC

## 2012-04-25 NOTE — Assessment & Plan Note (Signed)
Mid-epigastric pain concerning for GERD vs. Gallbladder disease vs. Functional abdominal pain. Instructed to take Omeprazole once daily for 4 weeks. If symptoms worsen or patient develops fever, acute increase in pain, or worsening GI symptoms, patient go to the ER. He will return to clinic in 4 weeks for follow-up.  If symptoms have not improved at that time, we may consider sending him for GI endoscopy or abdominal US.

## 2012-04-25 NOTE — Patient Instructions (Addendum)
It appears you may have reflux or indigestion. Start taking Omeprazole once daily for 4 weeks. If pain does not improve after 4 weeks, please return to clinic. If you develop worsening pain associated with fever, nausea or vomiting, please report to ER.  Gastroesophageal Reflux Disease, Adult Gastroesophageal reflux disease (GERD) happens when acid from your stomach goes into your food pipe (esophagus). The acid can cause a burning feeling in your chest. Over time, the acid can make small holes (ulcers) in your food pipe.  HOME CARE  Ask your doctor for advice about:  Losing weight.  Quitting smoking.  Alcohol use.  Avoid foods and drinks that make your problems worse. You may want to avoid:  Caffeine and alcohol.  Chocolate.  Mints.  Garlic and onions.  Spicy foods.  Citrus fruits, such as oranges, lemons, or limes.  Foods that contain tomato, such as sauce, chili, salsa, and pizza.  Fried and fatty foods.  Avoid lying down for 3 hours before you go to bed or before you take a nap.  Eat small meals often, instead of large meals.  Wear loose-fitting clothing. Do not wear anything tight around your waist.  Raise (elevate) the head of your bed 6 to 8 inches with wood blocks. Using extra pillows does not help.  Only take medicines as told by your doctor.  Do not take aspirin or ibuprofen. GET HELP RIGHT AWAY IF:   You have pain in your arms, neck, jaw, teeth, or back.  Your pain gets worse or changes.  You feel sick to your stomach (nauseous), throw up (vomit), or sweat (diaphoresis).  You feel short of breath, or you pass out (faint).  Your throw up is green, yellow, black, or looks like coffee grounds or blood.  Your poop (stool) is red, bloody, or black. MAKE SURE YOU:   Understand these instructions.  Will watch your condition.  Will get help right away if you are not doing well or get worse. Document Released: 06/29/2007 Document Revised: 04/04/2011  Document Reviewed: 07/30/2010 [facility] Patient Information 2013 [place], [place].

## 2012-04-25 NOTE — Progress Notes (Signed)
Subjective:     Patient ID: Stephen Frank, male   DOB: 04/07/1990, 22 y.o.   MRN: [id number]  HPI  22-y.o. AAM presents to clinic c/o epigastric discomfort for the past year.  States that pain mainly occurs first thing in the morning and lasts for 15 to 20 minutes.  His symptoms are relieved by food and drink.  Denies any associated cough, hoarseness, sore throat, changes in bowel habits, or fevers.  He typically eats dinner around 8:00 or 9:00 PM, and will occasionally snack around 2:00 AM.  Endorses that he eats a lot of fried foods and spicy foods.  No attempts to relieve the pain with medication.  No known family history of similar symptoms.  No previous episodes.  Denies chest pain, diaphoresis, nausea, vomiting.  Patient also desires HIV testing today.  He likes to get this done once a year.  Review of Systems Per HPI    Objective:   Physical Exam Constitutional: Alert, oriented.  NAD.   CV: RRR, normal S1/S2. No murmurs, rubs, or gallops. Pulmonary: Lungs CTA B/L.  No wheezes.  Normal respiratory effort. Abdomen: Soft, non-tender.  Mild abdominal distension diffusely.  Normal bowel sounds.  No guarding, no rebound.  No hepatosplenomegaly.    Assessment:    1. Epigastric discomfort - likely secondary to reflux or indigestion.   2. Possible HIV exposure - patient desires HIV testing today.    Plan:     Instructed to take Omeprazole once daily for 4 weeks. If symptoms worsen or patient develops fever, acute increase in pain, or worsening GI symptoms, he should go to the ER. He will return to clinic in 4 weeks for follow-up.  If symptoms have not improved at that time, we may consider sending him for GI endoscopy.  We will call him with his HIV testing results.  Dictated by Lonzo Candy, MS4.  PGY-3 Addendum:  Patient seen, examined. Available data reviewed. Agree with findings, assessment, and plan as outlined by Lonzo Candy.  My additional findings are documented and  highlighted above.  [place], DO 04/25/2012 5:26 PM

## 2012-04-26 ENCOUNTER — Encounter: Payer: Self-pay | Admitting: Family Medicine

## 2012-08-06 ENCOUNTER — Ambulatory Visit: Payer: Medicaid Other | Admitting: Family Medicine

## 2012-11-06 ENCOUNTER — Encounter: Payer: Self-pay | Admitting: Family Medicine

## 2012-11-06 ENCOUNTER — Ambulatory Visit (INDEPENDENT_AMBULATORY_CARE_PROVIDER_SITE_OTHER): Payer: Medicaid Other | Admitting: Family Medicine

## 2012-11-06 VITALS — BP 128/77 | HR 71 | Ht 70.0 in | Wt 219.0 lb

## 2012-11-06 DIAGNOSIS — Z113 Encounter for screening for infections with a predominantly sexual mode of transmission: Secondary | ICD-10-CM

## 2012-11-06 DIAGNOSIS — Z1159 Encounter for screening for other viral diseases: Secondary | ICD-10-CM

## 2012-11-06 DIAGNOSIS — Z23 Encounter for immunization: Secondary | ICD-10-CM

## 2012-11-06 DIAGNOSIS — E119 Type 2 diabetes mellitus without complications: Secondary | ICD-10-CM

## 2012-11-06 DIAGNOSIS — Z0000 Encounter for general adult medical examination without abnormal findings: Secondary | ICD-10-CM

## 2012-11-06 NOTE — Progress Notes (Signed)
Subjective:     Patient ID: Stephen Frank, male   DOB: Jun 07, 1990, 22 y.o.   MRN: [id number]  HPI  Stephen Frank is a 29 african [place] M with a PMH of bipolar d/o but not currently taking medications her for a yearly physical.   1. Ab pain: he has had persistent abdominal pain that is centralized, non radiating for 2 years. He has been prescribed omeprazole but does not take it on a regular basis. He states it feels like a knot. He hasn't found anything to help it. It is worse when he lies on his back and touches it. Leaning forward does help it.   He would like to have HIV, RPR, tested. He is sexually active and just wants to be sure. He uses condoms and has had multiple sex partners.   He also would like to be checked for diabetes but does not report any symptoms.   Health maintenance: flu vaccine recommended.  History   Social History  . Marital Status: Single    Spouse Name: N/A    Number of Children: N/A  . Years of Education: N/A   Occupational History  . Not on file.   Social History Main Topics  . Smoking status: Never Smoker   . Smokeless tobacco: Not on file  . Alcohol Use: No     Comment: social drinker  . Drug Use: Yes    Special: Marijuana     Comment: twice a week.   Marland Kitchen Sexual Activity: Yes    Birth Control/ Protection: Condom   Other Topics Concern  . Not on file   Social History Narrative   Lives in [place] with mother.     Goes to [employer] in [place] - taking General Education classes to get a AA Degree.    Family History  Problem Relation Age of Onset  . Diabetes Mother   . Hypertension Mother   . Cancer Maternal Grandfather   . Hyperlipidemia Maternal Grandfather     Review of Systems All other systems reviewed and otherwise normal.      Objective:   Physical Exam BP 128/77  Pulse 71  Ht 5\' 10"  (1.778 m)  Wt 219 lb (99.338 kg)  BMI 31.42 kg/m2 Gen: NAD, alert, cooperative with exam CV: RRR, good S1/S2, no murmur Resp: CTABL, no  wheezes, non-labored Abd: Soft,ND, BS present, no guarding or organomegaly, centralized epigastric tenderness on palpation.  MSK: 5/5 strength in UE/LE, normal sensation throughout.  Ext: No edema, warm Neuro: Alert and oriented, No gross deficits     Assessment:     1. Ab pain     Plan:     Received the flu vaccine.

## 2012-11-06 NOTE — Assessment & Plan Note (Signed)
Well appearing adult male.  - did have questions regarding gynecomastia since he received risperidone as a child. Continue to exercise and diet.   - Will perform screening for Hep B, Hep C, Hgb A1c, HIV and RPR.  - was not able to check for GC/ chlamydia due to an invalid collection. He filled the urine cup too full.

## 2012-11-06 NOTE — Patient Instructions (Signed)
Thank you for coming in,   I will call you with the results of your tests.   Please take the omeprazole on a regular basis and follow up with me if there are no changes in your abdominal pain.     Please feel free to call with any questions or concerns at any time, at [phone]. --Dr. Jordan Likes

## 2012-11-07 LAB — HIV ANTIBODY (ROUTINE TESTING W REFLEX): HIV: NONREACTIVE

## 2012-11-07 LAB — HEPATITIS B SURFACE ANTIBODY,QUALITATIVE: Hep B S Ab: NEGATIVE

## 2012-11-07 LAB — HEPATITIS C ANTIBODY, REFLEX: HCV Ab: NEGATIVE

## 2012-11-07 LAB — RPR

## 2012-11-08 ENCOUNTER — Telehealth: Payer: Self-pay | Admitting: *Deleted

## 2012-11-08 NOTE — Telephone Encounter (Signed)
Related message,patient would like test repeated,he states this would give him a peace of mind.patient was instructed to schedule a Lab appointment if you could please put in order for the GC/chlamydia.thank you Kyann Heydt, Virgel Bouquet

## 2012-11-08 NOTE — Telephone Encounter (Signed)
Message copied by Tanna Savoy on Thu Nov 08, 2012 10:03 AM ------      Message from: Clare Gandy E      Created: Wed Nov 07, 2012  8:42 PM       Please call the patient and let him know all of his labs are normal and he is not diabetic. Let him know the GC/chlamydia test wasn't able to be run due to an invalid specimen (he filled the cup too full). If he is not having any symptoms of that disease process then he doesn't need to be tested. Thank you. ------

## 2012-11-08 NOTE — Telephone Encounter (Signed)
Message copied by Tanna Savoy on Thu Nov 08, 2012 10:00 AM ------      Message from: Clare Gandy E      Created: Wed Nov 07, 2012  8:42 PM       Please call the patient and let him know all of his labs are normal and he is not diabetic. Let him know the GC/chlamydia test wasn't able to be run due to an invalid specimen (he filled the cup too full). If he is not having any symptoms of that disease process then he doesn't need to be tested. Thank you. ------

## 2012-11-20 ENCOUNTER — Telehealth: Payer: Self-pay | Admitting: Family Medicine

## 2012-11-20 DIAGNOSIS — Z113 Encounter for screening for infections with a predominantly sexual mode of transmission: Secondary | ICD-10-CM

## 2012-12-24 ENCOUNTER — Ambulatory Visit (INDEPENDENT_AMBULATORY_CARE_PROVIDER_SITE_OTHER): Payer: Medicaid Other | Admitting: Family Medicine

## 2012-12-24 ENCOUNTER — Telehealth: Payer: Self-pay | Admitting: Sports Medicine

## 2012-12-24 ENCOUNTER — Encounter: Payer: Self-pay | Admitting: Family Medicine

## 2012-12-24 VITALS — BP 131/70 | HR 86 | Temp 99.4°F | Ht 70.0 in | Wt 216.5 lb

## 2012-12-24 DIAGNOSIS — Z7251 High risk heterosexual behavior: Secondary | ICD-10-CM | POA: Insufficient documentation

## 2012-12-24 NOTE — Progress Notes (Signed)
Family Medicine Office Visit Note   Subjective:   Patient ID: Stephen Frank, male  DOB: 1990-04-06, 22 y.o.. MRN: [id number]   Pt that comes today for same day appointment concerning for herpes infection. He reports had unprotected sex last night, and then realized that her male partner had sores in her vulvlar area. Patient is concerned for HSV infection but no concerns for other STDs. He denies any other symptoms and denies rashes or lesion on his genitalia.   Review of Systems:  Per HPI  Objective:   Physical Exam: Gen:  NAD HEENT: Moist mucous membranes  CV: RRR, no murmurs m, PULM: Clear to auscultation bilaterally.  Genitalia: Patient declined physical exam of genitalia.   Assessment & Plan:

## 2012-12-24 NOTE — Telephone Encounter (Signed)
[facility] 24 Hour After-hours Emergency Line  Patient name: Stephen Frank  MRN: [id number]  AGE: 22 y.o.  Gender: male DOB: 08/24/1990     Primary Care Provider:   Clare Gandy, MD     Pt called after hours line requesting an emergency appointment due to concerns for sexually transmitted infection.  He said he was sexually active and thinks that his male sexual partner may have had "bumps".  He is very nervous.  Does report using a condom.  He denies any other signs or symptoms.   --- DISPOSITION: Explained that the 24-hour line is not able to schedule appointments, he is to call back at 8:30 to schedule a appointment for either today or tomorrow.  He is agreeable to this plan and appreciative of talking with somebody.   Andrena Mews, DO [facility] Resident - PGY-3 12/24/2012 6:58 AM

## 2012-12-24 NOTE — Assessment & Plan Note (Signed)
Concerning intercourse was last night. Review of literature recommend PCR in 2-3 weeks after contact. If  lesions appear sooner patient was instructed to come for reevaluation. Patient declined physical exam and rest of STD testing.

## 2012-12-24 NOTE — Patient Instructions (Signed)
Please make a lab appointment in 2-3 weeks. I will call you if the results are positive if not you will receive a letter.

## 2013-01-14 ENCOUNTER — Other Ambulatory Visit: Payer: Medicaid Other

## 2013-01-14 DIAGNOSIS — Z113 Encounter for screening for infections with a predominantly sexual mode of transmission: Secondary | ICD-10-CM | POA: Insufficient documentation

## 2013-01-14 DIAGNOSIS — Z7251 High risk heterosexual behavior: Secondary | ICD-10-CM

## 2013-01-14 NOTE — Progress Notes (Unsigned)
HSV PCR AND URINE GC DONE TODAY Stephen Frank

## 2013-01-15 ENCOUNTER — Other Ambulatory Visit (HOSPITAL_COMMUNITY)
Admission: RE | Admit: 2013-01-15 | Discharge: 2013-01-15 | Disposition: A | Discharge status: Home / Self Care | Payer: Medicaid Other | Source: Ambulatory Visit | Attending: Family Medicine | Admitting: Family Medicine

## 2013-01-16 ENCOUNTER — Telehealth: Payer: Self-pay | Admitting: Family Medicine

## 2013-01-16 DIAGNOSIS — A749 Chlamydial infection, unspecified: Secondary | ICD-10-CM

## 2013-01-16 MED ORDER — DOXYCYCLINE HYCLATE 100 MG PO TABS: 100.0000 mg | ORAL_TABLET | Freq: Two times a day (BID) | ORAL | Status: DC

## 2013-01-16 NOTE — Telephone Encounter (Signed)
Positive test result for chlamydia. Will send a 7 day course of doxycycline. Called results and medications info to patient.

## 2013-01-17 LAB — HSV PCR: HSV 2 , PCR: NOT DETECTED

## 2013-01-18 ENCOUNTER — Telehealth: Payer: Self-pay | Admitting: Family Medicine

## 2013-01-18 DIAGNOSIS — A749 Chlamydial infection, unspecified: Secondary | ICD-10-CM

## 2013-01-18 MED ORDER — DOXYCYCLINE HYCLATE 100 MG PO TABS: 100.0000 mg | ORAL_TABLET | Freq: Two times a day (BID) | ORAL | Status: DC

## 2013-01-18 NOTE — Telephone Encounter (Signed)
After hours line  Patient called and explained that his pharmacy states that his doxy was for some reason not received.  I resent the medication.   Murtis Sink, MD [facility] Resident, PGY-2 01/18/2013, 4:25 PM

## 2013-02-10 NOTE — Telephone Encounter (Signed)
Patient has been called and treated with doxycycline.

## 2013-04-22 ENCOUNTER — Other Ambulatory Visit (HOSPITAL_COMMUNITY)
Admission: RE | Admit: 2013-04-22 | Discharge: 2013-04-22 | Disposition: A | Discharge status: Home / Self Care | Payer: Medicaid Other | Source: Ambulatory Visit | Attending: Family Medicine | Admitting: Family Medicine

## 2013-04-22 ENCOUNTER — Ambulatory Visit (INDEPENDENT_AMBULATORY_CARE_PROVIDER_SITE_OTHER): Payer: Medicaid Other | Admitting: Family Medicine

## 2013-04-22 ENCOUNTER — Encounter: Payer: Self-pay | Admitting: Family Medicine

## 2013-04-22 VITALS — BP 128/79 | HR 77 | Temp 98.3°F | Wt 212.0 lb

## 2013-04-22 DIAGNOSIS — Z113 Encounter for screening for infections with a predominantly sexual mode of transmission: Secondary | ICD-10-CM | POA: Insufficient documentation

## 2013-04-22 DIAGNOSIS — R1013 Epigastric pain: Secondary | ICD-10-CM

## 2013-04-22 DIAGNOSIS — G8929 Other chronic pain: Secondary | ICD-10-CM

## 2013-04-22 DIAGNOSIS — Z7251 High risk heterosexual behavior: Secondary | ICD-10-CM

## 2013-04-22 DIAGNOSIS — E119 Type 2 diabetes mellitus without complications: Secondary | ICD-10-CM

## 2013-04-22 DIAGNOSIS — A64 Unspecified sexually transmitted disease: Secondary | ICD-10-CM

## 2013-04-22 LAB — POCT H PYLORI SCREEN: H PYLORI SCREEN, POC: NEGATIVE

## 2013-04-22 LAB — POCT GLYCOSYLATED HEMOGLOBIN (HGB A1C): Hemoglobin A1C: 5.7

## 2013-04-22 NOTE — Patient Instructions (Signed)
Thank you for coming in,   I will call you with the results of your test today.  Based on the tests then I will either treat you and send you for referral.     Please feel free to call with any questions or concerns at any time, at [phone]. --Dr. Duffy Rhody (carb) foods include: Bread, rice, pasta, potatoes, corn, crackers, bagels, muffins, all baked goods.   Protein foods include: Meat, fish, poultry, eggs, dairy foods, and beans such as pinto and kidney beans (beans also provide carbohydrate).   1. Eat at least 3 meals and 1-2 snacks per day. Never go more than 4-5 hours while awake without eating.  2. Limit starchy foods to TWO per meal and ONE per snack. ONE portion of a starchy  food is equal to the following:   - ONE slice of bread (or its equivalent, such as half of a hamburger bun).   - 1/2 cup of a "scoopable" starchy food such as potatoes or rice.   - 1 OUNCE (28 grams) of starchy snack foods such as crackers or pretzels (look on label).   - 15 grams of carbohydrate as shown on food label.  3. Both lunch and dinner should include a protein food, a carb food, and vegetables.   - Obtain twice as many veg's as protein or carbohydrate foods for both lunch and dinner.   - Try to keep frozen veg's on hand for a quick vegetable serving.     - Fresh or frozen veg's are best.  4. Breakfast should always include protein.

## 2013-04-23 LAB — HIV ANTIBODY (ROUTINE TESTING W REFLEX): HIV: NONREACTIVE

## 2013-04-23 LAB — RPR

## 2013-04-24 ENCOUNTER — Encounter: Payer: Self-pay | Admitting: Family Medicine

## 2013-04-24 LAB — URINE CYTOLOGY ANCILLARY ONLY
Chlamydia: NEGATIVE
NEISSERIA GONORRHEA: NEGATIVE

## 2013-04-24 NOTE — Progress Notes (Signed)
   Subjective:    Patient ID: Stephen Frank, male    DOB: 1990/04/22, 23 y.o.   MRN: [id number]  HPI  Stephen Frank is here for to check for diabetes, abdominal pain, and blood pressure check up.   He was seen by his allergist and noted to have an elevated blood pressure (155/88).  His blood pressure usually runs around 135/80.  He denies any chest pain, shortness of breath, lightheadedness/dizziness, and no lower extremity swelling. He jogs for exercise and is trying to eat better by cutting back on hamburgers.    Patient has several family members that have diabetes. His grandmother, grandfather, mother and uncle all have diabetes.  He denies any polyuria, polydipsia, and no fatigue.  He denies any feet or hand tingling.   He also complains of ongoing centralized epigastric pain.  It has been occuring for roughly a year.  At its worst it is a 9/10.  He currently does not take any medication but has taken Prilosec in the past with no relief. The pain occurs day or night and is relieved with eating. He has episodes of waking up at night having pain being relieved by foot.  He feels like he is constantly hungry.  He has no pain after eating.  It is not positional.  He describes it as achy and radiates up and down his epigastric region.  He vomited 3-4 weeks ago but associates that with a viral illness. He only denies excessive NSAID use and only take Excedrin when he has a migraine.  He denies drinking EtOH.    Patient has been having multiple sex partners and does not always use protection.  He denies any fevers or nausea. He denies any discharge. He has been previously treated for chlamydia.    Review of Systems See HPI     Objective:   Physical Exam BP 128/79  Pulse 77  Temp(Src) 98.3 F (36.8 C) (Oral)  Wt 212 lb (96.163 kg) Gen: NAD, alert, cooperative with exam, well-appearing, African American male  CV: RRR, good S1/S2, no murmur, no edema, capillary refill brisk  Resp: CTABL,  no wheezes, non-labored Abd: SNTND, BS present, no guarding or organomegaly Skin: no rashes, normal turgor          Assessment & Plan:

## 2013-04-24 NOTE — Assessment & Plan Note (Signed)
Pain describes suggests H.pylori infection or a duodenal ulcer.  - IgG POC H. Pylori  - based on results may need a referral to GI for EGD.

## 2013-04-24 NOTE — Assessment & Plan Note (Signed)
Patient describes having intercourse with no protection. Has been treated for chlamydia previously.  - urine cytology of gonorrhea/chlamydia.  - warned about the habit of unprotected intercourse

## 2013-04-25 ENCOUNTER — Other Ambulatory Visit: Payer: Self-pay | Admitting: Family Medicine

## 2013-04-25 DIAGNOSIS — G8929 Other chronic pain: Secondary | ICD-10-CM

## 2013-04-25 DIAGNOSIS — R1013 Epigastric pain: Principal | ICD-10-CM

## 2013-05-01 ENCOUNTER — Encounter: Payer: Self-pay | Admitting: Internal Medicine

## 2013-05-06 ENCOUNTER — Encounter: Payer: Self-pay | Admitting: Internal Medicine

## 2013-05-08 ENCOUNTER — Telehealth: Payer: Self-pay | Admitting: *Deleted

## 2013-05-08 NOTE — Telephone Encounter (Signed)
Message copied by Johny Shears on Wed May 08, 2013  8:58 AM ------      Message from: Rosemarie Ax      Created: Tue May 07, 2013  5:37 PM       Please let Mr. Baskett know that all of his labs were normal. Thank you. ------

## 2013-05-08 NOTE — Telephone Encounter (Signed)
LVM for patient to call back. ?

## 2013-05-14 ENCOUNTER — Telehealth: Payer: Self-pay | Admitting: Family Medicine

## 2013-05-14 NOTE — Telephone Encounter (Signed)
Spoke to patient requested more information to give Dr Robb Matar and he insisted on only talking to him and hung up.Please advise.Thank you.Tanana

## 2013-05-14 NOTE — Telephone Encounter (Signed)
Pt called and would like Dr. Raeford Razor to call him. Stephen Frank

## 2013-05-16 NOTE — Telephone Encounter (Signed)
Tried to call patient but unable to reach him. Will try again at a later date.

## 2013-06-20 ENCOUNTER — Other Ambulatory Visit (INDEPENDENT_AMBULATORY_CARE_PROVIDER_SITE_OTHER): Payer: Medicaid Other

## 2013-06-20 ENCOUNTER — Encounter: Payer: Self-pay | Admitting: Internal Medicine

## 2013-06-20 ENCOUNTER — Ambulatory Visit (INDEPENDENT_AMBULATORY_CARE_PROVIDER_SITE_OTHER): Payer: Medicaid Other | Admitting: Internal Medicine

## 2013-06-20 VITALS — BP 120/70 | HR 72 | Ht 70.0 in | Wt 206.2 lb

## 2013-06-20 DIAGNOSIS — G8929 Other chronic pain: Secondary | ICD-10-CM

## 2013-06-20 DIAGNOSIS — R079 Chest pain, unspecified: Secondary | ICD-10-CM

## 2013-06-20 DIAGNOSIS — R1013 Epigastric pain: Secondary | ICD-10-CM

## 2013-06-20 LAB — CBC WITH DIFFERENTIAL/PLATELET
BASOS ABS: 0 10*3/uL (ref 0.0–0.1)
BASOS PCT: 0.6 % (ref 0.0–3.0)
EOS PCT: 7.5 % — AB (ref 0.0–5.0)
Eosinophils Absolute: 0.5 10*3/uL (ref 0.0–0.7)
HEMATOCRIT: 41.8 % (ref 39.0–52.0)
HEMOGLOBIN: 14 g/dL (ref 13.0–17.0)
LYMPHS ABS: 3.3 10*3/uL (ref 0.7–4.0)
Lymphocytes Relative: 48.1 % — ABNORMAL HIGH (ref 12.0–46.0)
MCHC: 33.4 g/dL (ref 30.0–36.0)
MCV: 86.5 fl (ref 78.0–100.0)
MONOS PCT: 7.2 % (ref 3.0–12.0)
Monocytes Absolute: 0.5 10*3/uL (ref 0.1–1.0)
NEUTROS ABS: 2.5 10*3/uL (ref 1.4–7.7)
Neutrophils Relative %: 36.6 % — ABNORMAL LOW (ref 43.0–77.0)
Platelets: 193 10*3/uL (ref 150.0–400.0)
RBC: 4.84 Mil/uL (ref 4.22–5.81)
RDW: 13.6 % (ref 11.5–15.5)
WBC: 6.9 10*3/uL (ref 4.0–10.5)

## 2013-06-20 MED ORDER — OMEPRAZOLE 20 MG PO CPDR: 20.0000 mg | DELAYED_RELEASE_CAPSULE | Freq: Every day | ORAL | Status: DC

## 2013-06-20 NOTE — Assessment & Plan Note (Signed)
?   GERD also, ? EoE, retry PPI for appropriate time period

## 2013-06-20 NOTE — Patient Instructions (Addendum)
Your physician has requested that you go to the basement for the following lab work before leaving today: CBC  We have sent the following medications to your pharmacy for you to pick up at your convenience: Omeprazole  Follow up with [place] in 6-8 weeks.  Appt made for 08/26/13 at 9:45am.   I appreciate the opportunity to care for you.

## 2013-06-20 NOTE — Progress Notes (Addendum)
Referred by Dr. Raeford Razor Subjective:    Patient ID: Stephen Frank, male    DOB: 04/06/1990, 23 y.o.   MRN: [id number]  HPI 23 yo AA male with 1 year hx of abdominal pain. Epigastric pain, mild, better w/meals. Also pc chest pressure, x days not dysphagia No melena. + slight weight loss.  H. Pylori negative antibody Rx Prilosec but he only took a few "because I thought that was for reflux"  GI ROS o/w neg. No Known Allergies   Past Medical History  Diagnosis Date  . History of seasonal allergies   . ADHD (attention deficit hyperactivity disorder)   . Allergy   . Bipolar disorder    Past Surgical History  Procedure Laterality Date  . Tonsillectomy and adenoidectomy     History   Social History  . Marital Status: Single    Spouse Name: N/A    Number of Children: N/A  . Years of Education: N/A   Occupational History  . Food Service    Social History Main Topics  . Smoking status: Never Smoker   . Smokeless tobacco: Never Used  . Alcohol Use: No     Comment: social drinker  . Drug Use: Yes    Special: Marijuana     Comment: twice a week.   Marland Kitchen Sexual Activity: Yes    Birth Control/ Protection: Condom   Other Topics Concern  . None   Social History Narrative   Lives in [place] with mother.     Goes to [employer] in [place] - taking General Education classes to get a AA Degree.   Family History  Problem Relation Age of Onset  . Diabetes Mother   . Hypertension Mother   . Colon cancer Maternal Grandfather   . Hyperlipidemia Maternal Grandfather   . Diabetes Maternal Grandfather   . Diabetes Maternal Grandmother    Review of Systems + Tx for allergies, headaches, back pain All other ROS negative or per HPI    Objective:   Physical Exam General:  Well-developed, well-nourished and in no acute distress Eyes:  anicteric. ENT:   Mouth and posterior pharynx free of lesions. Fair dentition, + caries Neck:   supple w/o thyromegaly or mass.   Lungs: Clear to auscultation bilaterally. Heart:  S1S2, no rubs, murmurs, gallops. Abdomen:  soft, mildly tender epigastrium, no hepatosplenomegaly, hernia, or mass and BS+.  Lymph:  no cervical or supraclavicular adenopathy. Extremities:   no edema Skin   no rash. + tattoos Neuro:  A&O x 3.  Psych:  appropriate mood and  Affect.   Data Reviewed: H. Pylori neg    Assessment & Plan:  Abdominal pain, chronic, epigastric Sounds like it could be a duodenal ulcer - eosinophilic esophagitis/gastritis also possible Will check CBC and treat with PPI Omeprazole 20 mg daily RTC 6-8 weeks Discussed option of EGD - he prefers to try Tx first - if not responding or different issues arise then proceed with that He was advised to call back sooner than 6-8 weeks if not improving  Chest pain after meals ? GERD also, ? EoE, retry PPI for appropriate time period    Current outpatient prescriptions:beclomethasone (QVAR) 80 MCG/ACT inhaler, Inhale 2 puffs into the lungs 2 (two) times daily., Disp: , Rfl: ;  fexofenadine (ALLEGRA) 180 MG tablet, Take 180 mg by mouth daily., Disp: , Rfl: ;  omeprazole (PRILOSEC) 20 MG capsule, Take 1 capsule (20 mg total) by mouth daily before breakfast.,  Disp: 30 capsule, Rfl: 2  I appreciate the opportunity to care for this patient. KD:[id number], Enid Baas, MD

## 2013-06-20 NOTE — Assessment & Plan Note (Addendum)
Sounds like it could be a duodenal ulcer - eosinophilic esophagitis/gastritis also possible Will check CBC and treat with PPI Omeprazole 20 mg daily RTC 6-8 weeks

## 2013-08-08 ENCOUNTER — Encounter: Payer: Self-pay | Admitting: Gastroenterology

## 2013-08-09 ENCOUNTER — Ambulatory Visit (INDEPENDENT_AMBULATORY_CARE_PROVIDER_SITE_OTHER): Payer: Medicaid Other | Admitting: Family Medicine

## 2013-08-09 ENCOUNTER — Encounter: Payer: Self-pay | Admitting: Family Medicine

## 2013-08-09 VITALS — BP 139/67 | HR 87 | Temp 97.9°F | Ht 70.0 in | Wt 204.0 lb

## 2013-08-09 DIAGNOSIS — N62 Hypertrophy of breast: Secondary | ICD-10-CM

## 2013-08-09 DIAGNOSIS — N4889 Other specified disorders of penis: Secondary | ICD-10-CM

## 2013-08-09 LAB — TSH: TSH: 1.687 u[IU]/mL (ref 0.350–4.500)

## 2013-08-09 NOTE — Patient Instructions (Signed)
Thank you for coming in,   You can check myChart for the labs. If there out the ordinary then we can do something about it.    Please feel free to call with any questions or concerns at any time, at [phone]. --Dr. Raeford Razor

## 2013-08-09 NOTE — Progress Notes (Signed)
   Subjective:    Patient ID: Stephen Frank, male    DOB: July 06, 1990, 23 y.o.   MRN: [id number]  HPI  Stephen Frank is here for bumps on his penis and gynecomastia.  Patient has had bumps on his penis. They haven't changed at all. Haven't changed as of late. No high risk sexual behavior. No pain. No penile discharge.   Breast tissue. Patient use to take several anti-psychotic medications. No discharge. Nonpainful. Hasn't taken any medications since he was 23 years old. He did this on his own.   Current Outpatient Prescriptions on File Prior to Visit  Medication Sig Dispense Refill  . beclomethasone (QVAR) 80 MCG/ACT inhaler Inhale 2 puffs into the lungs 2 (two) times daily.      . fexofenadine (ALLEGRA) 180 MG tablet Take 180 mg by mouth daily.      Marland Kitchen omeprazole (PRILOSEC) 20 MG capsule Take 1 capsule (20 mg total) by mouth daily before breakfast.  30 capsule  2   No current facility-administered medications on file prior to visit.   Review of Systems See HPI     Objective:   Physical Exam BP 139/67  Pulse 87  Temp(Src) 97.9 F (36.6 C) (Oral)  Ht 5\' 10"  (1.778 m)  Wt 204 lb (92.534 kg)  BMI 29.27 kg/m2 Gen: NAD, alert, cooperative with exam, well-appearing Chest:  Bilateral gynecomastia, no fibrous tissue or hard tissue palpated GU: Pearly penile papules observed on glans penis      Assessment & Plan:

## 2013-08-13 ENCOUNTER — Telehealth: Payer: Self-pay | Admitting: *Deleted

## 2013-08-13 DIAGNOSIS — N62 Hypertrophy of breast: Secondary | ICD-10-CM | POA: Insufficient documentation

## 2013-08-13 DIAGNOSIS — N4889 Other specified disorders of penis: Secondary | ICD-10-CM | POA: Insufficient documentation

## 2013-08-13 LAB — TESTOSTERONE: Testosterone: 357 ng/dL (ref 300–890)

## 2013-08-13 LAB — ESTRADIOL: Estradiol: 20.9 pg/mL

## 2013-08-13 LAB — LUTEINIZING HORMONE: LH: 4.9 m[IU]/mL (ref 1.5–9.3)

## 2013-08-13 NOTE — Telephone Encounter (Signed)
Spoke with patient and informed him that labs are normal

## 2013-08-13 NOTE — Assessment & Plan Note (Signed)
Patient with history of bipolar.  Was medicated and developed gynecomastia when he was 23 years old and has not resolved. - LH, TSH, estradiol were within normal limits - Encouraged to exercise - Given the idea of surgery being the only other option but he is willing to manage on his own - Discussed with Dr. Gwendlyn Deutscher

## 2013-08-13 NOTE — Assessment & Plan Note (Signed)
Observe on glans penis. No further workup the patient is happy that it is not a STD.

## 2013-08-13 NOTE — Telephone Encounter (Signed)
Message copied by Johny Shears on Tue Aug 13, 2013  1:49 PM ------      Message from: Rosemarie Ax      Created: Tue Aug 13, 2013  1:07 PM       Please call patient to inform all labs were within normal limits. Thank you ------

## 2013-08-26 ENCOUNTER — Ambulatory Visit (INDEPENDENT_AMBULATORY_CARE_PROVIDER_SITE_OTHER): Payer: Medicaid Other | Admitting: Internal Medicine

## 2013-08-26 ENCOUNTER — Encounter: Payer: Self-pay | Admitting: Internal Medicine

## 2013-08-26 VITALS — BP 118/72 | HR 54 | Ht 71.0 in | Wt 201.8 lb

## 2013-08-26 DIAGNOSIS — R0789 Other chest pain: Secondary | ICD-10-CM

## 2013-08-26 DIAGNOSIS — R1013 Epigastric pain: Secondary | ICD-10-CM

## 2013-08-26 DIAGNOSIS — G8929 Other chronic pain: Secondary | ICD-10-CM

## 2013-08-26 MED ORDER — OMEPRAZOLE 20 MG PO CPDR: 20.0000 mg | DELAYED_RELEASE_CAPSULE | Freq: Every day | ORAL | Status: DC

## 2013-08-26 NOTE — Patient Instructions (Signed)
You have been scheduled for an endoscopy. Please follow written instructions given to you at your visit today. If you use inhalers (even only as needed), please bring them with you on the day of your procedure. Your physician has requested that you go to www.startemmi.com and enter the access code given to you at your visit today. This web site gives a general overview about your procedure. However, you should still follow specific instructions given to you by our office regarding your preparation for the procedure.  Please take your omeprazole 30 minutes before your first meal of the day.   I appreciate the opportunity to care for you.

## 2013-08-26 NOTE — Assessment & Plan Note (Signed)
Same - plan EGD, take PPI before meal The risks and benefits as well as alternatives of endoscopic procedure(s) have been discussed and reviewed. All questions answered. The patient agrees to proceed.

## 2013-08-26 NOTE — Progress Notes (Signed)
[name]    [id number]    06/20/1990    Assessment and Plan/Recommendations:  Epigastric Pain:  No relief from PPI.  Will schedule for EGD unless too expensive for pt, than may consider further options. Possible Soy Allergy:  Positive allergen test but no reactions. May be unable to use propofol.   The risks and benefits as well as alternatives of endoscopic procedure(s) have been discussed and reviewed. All questions answered. The patient agrees to proceed.   HPI: Stephen Frank is a 23 y.o. AA male who had presented to the office in May of 2015 with a long history of abdominal pain with some relief after eating.  He was started on prilosec suspecting an ulcer, but after approx 2 months of treatment he has had no relief.  He describes the pain as irritating and epigastric. He gets some relief after eating and the pain usually does not come back until the next morning.  He denies being awoken from sleep by it, but states it can be worst in the morning when he has an empty stomach.  He denies NSAID use, alcohol use, or frequent caffeine. He also denies fever, chills, N/V, diarrhea, dysphagia, and regurgitation of food.  He has lost several lbs (3-4 lbs) but states he has been watching his diet and has daily physical activity ("push ups and walking dog").    Outpatient Encounter Prescriptions as of 08/26/2013  Medication Sig  . beclomethasone (QVAR) 80 MCG/ACT inhaler Inhale 2 puffs into the lungs 2 (two) times daily.  . fexofenadine (ALLEGRA) 180 MG tablet Take 180 mg by mouth daily.  Marland Kitchen omeprazole (PRILOSEC) 20 MG capsule Take 1 capsule (20 mg total) by mouth daily before breakfast.  . [DISCONTINUED] omeprazole (PRILOSEC) 20 MG capsule Take 1 capsule (20 mg total) by mouth daily before breakfast.    Allergies as of 08/26/2013 - Review Complete 08/26/2013  Allergen Reaction Noted  . Soy allergy  08/26/2013    Past Medical History  Diagnosis Date  . History of seasonal  allergies   . ADHD (attention deficit hyperactivity disorder)   . Allergy   . Bipolar disorder   . H/O chlamydia infection     Past Surgical History  Procedure Laterality Date  . Tonsillectomy and adenoidectomy      Family History  Problem Relation Age of Onset  . Diabetes Mother   . Hypertension Mother   . Colon cancer Maternal Grandfather   . Hyperlipidemia Maternal Grandfather   . Diabetes Maternal Grandfather   . Diabetes Maternal Grandmother     History   Social History  . Marital Status: Single   Occupational History  . Food Service    Social History Main Topics  . Smoking status: Never Smoker   . Smokeless tobacco: Never Used  . Alcohol Use: No     Comment: social drinker  . Drug Use: Yes    Special: Marijuana     Comment: twice a week.   Marland Kitchen Sexual Activity: Yes    Birth Control/ Protection: Condom   Social History Narrative   Lives in [place] with mother.     Goes to [employer] in [place] - taking General Education classes to get a AA Degree.  Review of systems: Positive for: Epigastric pain All other ROS negative or as per HPI  Physical Exam: BP 118/72  Pulse 54  Ht 5\' 11"  (1.803 m)  Wt 201 lb 12.8 oz (91.536 kg)  BMI 28.16 kg/m2 Constitutional: WDWN  NAD Eyes: anicteric Mouth: oral and posterior pharynx free of lesions Neck: supple, no mass or thyromegaly Lungs: clear to auscultation bilaterally Cardiovascular: S1S2 with regular rate and rhythm, no rubs murmurs or gallops Abdomen: soft, nondistended, no masses or organomegaly, normal bowel sounds. Tender to epigastric region, almost seems to involve ribs. Extremities: no lower extremity edema  Skin: no rash Neuro: alert and oriented x 3 Psych: normal mood and affect  Data Reviewed:  Past visit information.  Blanca Friend, [place] Student 08/26/2013 10:42 AM   I have personally seen the patient, reviewed and repeated key elements of the history and physical and participated  in formation of the assessment and plan the student has documented.  Gatha Mayer, MD, [facility] [id number], Enid Baas, MD

## 2013-10-21 ENCOUNTER — Encounter: Payer: Self-pay | Admitting: Internal Medicine

## 2013-10-21 ENCOUNTER — Ambulatory Visit (AMBULATORY_SURGERY_CENTER): Payer: Medicaid Other | Admitting: Internal Medicine

## 2013-10-21 VITALS — BP 149/82 | HR 74 | Temp 96.1°F | Resp 19 | Ht 71.0 in | Wt 231.0 lb

## 2013-10-21 DIAGNOSIS — D131 Benign neoplasm of stomach: Secondary | ICD-10-CM

## 2013-10-21 DIAGNOSIS — K219 Gastro-esophageal reflux disease without esophagitis: Secondary | ICD-10-CM

## 2013-10-21 DIAGNOSIS — R1013 Epigastric pain: Secondary | ICD-10-CM

## 2013-10-21 MED ORDER — SODIUM CHLORIDE 0.9 % IV SOLN: 500.0000 mL | INTRAVENOUS | Status: DC

## 2013-10-21 NOTE — Op Note (Signed)
[place]  [address] [place], [address]   ENDOSCOPY PROCEDURE REPORT  PATIENT: Stephen Frank, Stephen Frank  MR#: [id number] BIRTHDATE: [birth year] , 23  yrs. old GENDER: male ENDOSCOPIST: Gatha Mayer, MD, [facility] PROCEDURE DATE:  10/21/2013 PROCEDURE:  EGD w/ biopsy ASA CLASS:     Class II INDICATIONS:  epigastric abdominal pain. MEDICATIONS: Propofol 250 mg IV and Monitored anesthesia care TOPICAL ANESTHETIC: none  DESCRIPTION OF PROCEDURE: After the risks benefits and alternatives of the procedure were thoroughly explained, informed consent was obtained.  The LB [id number] endoscope was introduced through the mouth and advanced to the second portion of the duodenum , Without limitations.  The instrument was slowly withdrawn as the mucosa was fully examined.  ESOPHAGUS: A linear patch of abnormal mucosa was found in the lower third of the esophagus.  The mucosa was friable.  Multiple biopsies were performed.  STOMACH: The mucosa of the stomach appeared normal.  Multiple biopsies were performed using cold forceps.  Sample sent for histology.  Except for the findings listed the EGD was otherwise normal. Retroflexed views revealed no abnormalities.     The scope was then withdrawn from the patient and the procedure completed.  COMPLICATIONS: There were no immediate complications.  ENDOSCOPIC IMPRESSION: 1.   Linear abnormal mucosa was found in the lower third of the esophagus; The mucosa was friable; multiple biopsies were performed  2.   The mucosa of the stomach appeared normal; multiple biopsies were performed 3.   EGD was otherwise normal RECOMMENDATIONS: Office will call with results   eSigned:  Gatha Mayer, MD, [facility] 10/21/2013 3:12 PM  CC: The Patient

## 2013-10-21 NOTE — Patient Instructions (Addendum)
I found some possible inflammation in the esophagus and stomach. Biopsies were taken,. I will let you know results and plans with a phone call.  I appreciate the opportunity to care for you. Gatha Mayer, MD, FACG    YOU HAD AN ENDOSCOPIC PROCEDURE TODAY AT [facility]: Refer to the procedure report that was given to you for any specific questions about what was found during the examination.  If the procedure report does not answer your questions, please call your gastroenterologist to clarify.  If you requested that your care partner not be given the details of your procedure findings, then the procedure report has been included in a sealed envelope for you to review at your convenience later.  YOU SHOULD EXPECT: Some feelings of bloating in the abdomen. Passage of more gas than usual.  Walking can help get rid of the air that was put into your GI tract during the procedure and reduce the bloating. If you had a lower endoscopy (such as a colonoscopy or flexible sigmoidoscopy) you may notice spotting of blood in your stool or on the toilet paper. If you underwent a bowel prep for your procedure, then you may not have a normal bowel movement for a few days.  DIET: Your first meal following the procedure should be a light meal and then it is ok to progress to your normal diet.  A half-sandwich or bowl of soup is an example of a good first meal.  Heavy or fried foods are harder to digest and may make you feel nauseous or bloated.  Likewise meals heavy in dairy and vegetables can cause extra gas to form and this can also increase the bloating.  Drink plenty of fluids but you should avoid alcoholic beverages for 24 hours.  ACTIVITY: Your care partner should take you home directly after the procedure.  You should plan to take it easy, moving slowly for the rest of the day.  You can resume normal activity the day after the procedure however you should NOT DRIVE or use heavy machinery for  24 hours (because of the sedation medicines used during the test).    SYMPTOMS TO REPORT IMMEDIATELY: A gastroenterologist can be reached at any hour.  During normal business hours, 8:30 AM to 5:00 PM Monday through Friday, call [phone].  After hours and on weekends, please call the GI answering service at [phone] who will take a message and have the physician on call contact you.    Following upper endoscopy (EGD)  Vomiting of blood or coffee ground material  New chest pain or pain under the shoulder blades  Painful or persistently difficult swallowing  New shortness of breath  Fever of 100F or higher  Black, tarry-looking stools  FOLLOW UP: If any biopsies were taken you will be contacted by phone or by letter within the next 1-3 weeks.  Call your gastroenterologist if you have not heard about the biopsies in 3 weeks.  Our staff will call the home number listed on your records the next business day following your procedure to check on you and address any questions or concerns that you may have at that time regarding the information given to you following your procedure. This is a courtesy call and so if there is no answer at the home number and we have not heard from you through the emergency physician on call, we will assume that you have returned to your regular daily activities without incident.  Resume medications.  SIGNATURES/CONFIDENTIALITY: You and/or your care partner have signed paperwork which will be entered into your electronic medical record.  These signatures attest to the fact that that the information above on your After Visit Summary has been reviewed and is understood.  Full responsibility of the confidentiality of this discharge information lies with you and/or your care-partner.

## 2013-10-21 NOTE — Progress Notes (Signed)
Report to PACU, RN, vss, BBS= Clear.  

## 2013-10-21 NOTE — Progress Notes (Signed)
Called to room to assist during endoscopic procedure.  Patient ID and intended procedure confirmed with present staff. Received instructions for my participation in the procedure from the performing physician.  

## 2013-10-22 ENCOUNTER — Telehealth: Payer: Self-pay | Admitting: *Deleted

## 2013-10-22 NOTE — Telephone Encounter (Signed)
  Follow up Call-  Call back number 10/21/2013  Post procedure Call Back phone  # [phone] cell  Permission to leave phone message Yes     No answer at # given. Left message on voicemail.

## 2013-10-27 ENCOUNTER — Other Ambulatory Visit: Payer: Self-pay | Admitting: Internal Medicine

## 2013-10-27 DIAGNOSIS — K2100 Gastro-esophageal reflux disease with esophagitis, without bleeding: Secondary | ICD-10-CM

## 2013-10-27 MED ORDER — PANTOPRAZOLE SODIUM 40 MG PO TBEC: 40.0000 mg | DELAYED_RELEASE_TABLET | Freq: Every day | ORAL | Status: DC

## 2013-10-27 NOTE — Progress Notes (Signed)
Quick Note:  My chart note sent re: esophagitis Change omeprazole 20 mg qd to pantoprazole 40 mg qd and see me in 2 months ______

## 2013-10-28 ENCOUNTER — Other Ambulatory Visit: Payer: Self-pay

## 2013-11-12 ENCOUNTER — Encounter: Payer: Self-pay | Admitting: Internal Medicine

## 2013-11-21 ENCOUNTER — Telehealth: Payer: Self-pay | Admitting: Family Medicine

## 2013-11-21 DIAGNOSIS — K2970 Gastritis, unspecified, without bleeding: Secondary | ICD-10-CM

## 2013-11-21 NOTE — Telephone Encounter (Signed)
Referral placed for GI for f/u after his EGD.   Rosemarie Ax, MD PGY-2, [facility] 11/21/2013, 4:44 PM

## 2013-11-21 NOTE — Telephone Encounter (Signed)
Pt had Endoscopy done on 9/28 at [facility] w/ Dr. Carlean Purl. Patient is now needing a follow up and [facility] needs referral/auth from [place] in order to schedule. Pls advise.

## 2013-11-26 ENCOUNTER — Ambulatory Visit (INDEPENDENT_AMBULATORY_CARE_PROVIDER_SITE_OTHER): Payer: Medicaid Other | Admitting: Internal Medicine

## 2013-11-26 ENCOUNTER — Encounter: Payer: Self-pay | Admitting: Internal Medicine

## 2013-11-26 VITALS — BP 130/82 | HR 80 | Ht 71.0 in | Wt 208.6 lb

## 2013-11-26 DIAGNOSIS — K2100 Gastro-esophageal reflux disease with esophagitis, without bleeding: Secondary | ICD-10-CM

## 2013-11-26 NOTE — Progress Notes (Signed)
     The patient is well on pantoprazole 40 mg qd. He does have some early AM indigestion - he does frequiently eat < 3 hrs before bed/recumbency.  No dysphagia.  Filed Vitals:   11/26/13 0925  BP: 130/82  Pulse: 80    GERD (gastroesophageal reflux disease) Doing well on daily pantoprazole. Had sxs x 2 years so think based upon that and esophagitis at EGD would continue chronically. Reduce caffeine and late meals. GERD diet instructions provided.

## 2013-11-26 NOTE — Patient Instructions (Addendum)
  Please avoid eating close to bedtime. Stay on the pantoprazole every day. You may refill through primary care and see me as needed.   Follow the GERD diet  We have given you.  It is the time of year to have a vaccination to prevent the flu (influenza virus).  Please have this done through your primary care provider or you can get this done at local pharmacies or the [facility]. It would be very helpful if you notify your primary care provider when and where you had the vaccination given by messaging them in My Chart, leaving a message or faxing the information.  [place]!  I appreciate the opportunity to care for you. Gatha Mayer, MD, [facility]

## 2013-11-26 NOTE — Assessment & Plan Note (Signed)
Doing well on daily pantoprazole. Had sxs x 2 years so think based upon that and esophagitis at EGD would continue chronically. Reduce caffeine and late meals. GERD diet instructions provided.

## 2014-01-21 ENCOUNTER — Ambulatory Visit: Payer: Medicaid Other

## 2014-02-07 ENCOUNTER — Ambulatory Visit (INDEPENDENT_AMBULATORY_CARE_PROVIDER_SITE_OTHER): Payer: Medicaid Other | Admitting: *Deleted

## 2014-02-07 DIAGNOSIS — Z23 Encounter for immunization: Secondary | ICD-10-CM

## 2014-03-09 ENCOUNTER — Other Ambulatory Visit: Payer: Self-pay | Admitting: Internal Medicine

## 2014-05-08 ENCOUNTER — Encounter: Payer: Self-pay | Admitting: Family Medicine

## 2014-05-08 ENCOUNTER — Ambulatory Visit (INDEPENDENT_AMBULATORY_CARE_PROVIDER_SITE_OTHER): Payer: Medicaid Other | Admitting: Family Medicine

## 2014-05-08 VITALS — BP 148/86 | HR 70 | Temp 98.1°F | Ht 70.0 in | Wt 207.3 lb

## 2014-05-08 DIAGNOSIS — J302 Other seasonal allergic rhinitis: Secondary | ICD-10-CM | POA: Insufficient documentation

## 2014-05-08 DIAGNOSIS — J3489 Other specified disorders of nose and nasal sinuses: Secondary | ICD-10-CM | POA: Insufficient documentation

## 2014-05-08 DIAGNOSIS — J309 Allergic rhinitis, unspecified: Secondary | ICD-10-CM | POA: Insufficient documentation

## 2014-05-08 DIAGNOSIS — J301 Allergic rhinitis due to pollen: Secondary | ICD-10-CM

## 2014-05-08 DIAGNOSIS — H6122 Impacted cerumen, left ear: Secondary | ICD-10-CM | POA: Diagnosis not present

## 2014-05-08 DIAGNOSIS — H6120 Impacted cerumen, unspecified ear: Secondary | ICD-10-CM | POA: Insufficient documentation

## 2014-05-08 MED ORDER — FLUTICASONE PROPIONATE 50 MCG/ACT NA SUSP: 2.0000 | Freq: Every day | NASAL | Status: DC

## 2014-05-08 MED ORDER — CARBAMIDE PEROXIDE 6.5 % OT SOLN: 5.0000 [drp] | Freq: Two times a day (BID) | OTIC | Status: DC

## 2014-05-08 MED ORDER — GUAIFENESIN ER 600 MG PO TB12: 600.0000 mg | ORAL_TABLET | Freq: Two times a day (BID) | ORAL | Status: DC

## 2014-05-08 MED ORDER — MUPIROCIN 2 % EX OINT: 1.0000 "application " | TOPICAL_OINTMENT | Freq: Two times a day (BID) | CUTANEOUS | Status: DC

## 2014-05-08 NOTE — Assessment & Plan Note (Signed)
Continue Allegra, started on Flonase and Mucinex. No signs of sinus infection today. Follow-up if needed, discussed if it is not improved or getting worse he will need to be seen again. At that time we can trial other antihistamines, possibly singular addition, rule out sinus infection.

## 2014-05-08 NOTE — Assessment & Plan Note (Signed)
Patient has recurrent impaction of the left ear, he has been poking at it with a repeat and try to get it out without good success, having muffled hearing. Canal completely impacted with cerumen Debrox solution ordered F/u as needed

## 2014-05-08 NOTE — Patient Instructions (Addendum)
I believe all your symptoms are from allergies, including allergic rhinitis. Continue your Allegra daily, start Flonase and Mucinex. I have called both of these in. Flonase use until the end of May, and then to use it for a little while and see if your symptoms return. If your symptoms return he can restart it. Use Mucinex for the next 3-7 days, and to you feel everything is dried up. Have called you in some Bactroban ointment, but this on a Q-tip and placed on the inside of your nose for you have a small sore. You this until the sores completely healed. You noticed any drainage or redness from the area, or becomes more painful you need to be seen for this. I have called in a medication called the proximal, it is for your years to help clean out the left ear as it was packed with wax today. Make an appointment with your primary care provider to have your yearly physical.  Allergic Rhinitis Allergic rhinitis is when the mucous membranes in the nose respond to allergens. Allergens are particles in the air that cause your body to have an allergic reaction. This causes you to release allergic antibodies. Through a chain of events, these eventually cause you to release histamine into the blood stream. Although meant to protect the body, it is this release of histamine that causes your discomfort, such as frequent sneezing, congestion, and an itchy, runny nose.  CAUSES  Seasonal allergic rhinitis (hay fever) is caused by pollen allergens that may come from grasses, trees, and weeds. Year-round allergic rhinitis (perennial allergic rhinitis) is caused by allergens such as house dust mites, pet dander, and mold spores.  SYMPTOMS   Nasal stuffiness (congestion).  Itchy, runny nose with sneezing and tearing of the eyes. DIAGNOSIS  Your health care provider can help you determine the allergen or allergens that trigger your symptoms. If you and your health care provider are unable to determine the allergen, skin  or blood testing may be used. TREATMENT  Allergic rhinitis does not have a cure, but it can be controlled by:  Medicines and allergy shots (immunotherapy).  Avoiding the allergen. Hay fever may often be treated with antihistamines in pill or nasal spray forms. Antihistamines block the effects of histamine. There are over-the-counter medicines that may help with nasal congestion and swelling around the eyes. Check with your health care provider before taking or giving this medicine.  If avoiding the allergen or the medicine prescribed do not work, there are many new medicines your health care provider can prescribe. Stronger medicine may be used if initial measures are ineffective. Desensitizing injections can be used if medicine and avoidance does not work. Desensitization is when a patient is given ongoing shots until the body becomes less sensitive to the allergen. Make sure you follow up with your health care provider if problems continue. HOME CARE INSTRUCTIONS It is not possible to completely avoid allergens, but you can reduce your symptoms by taking steps to limit your exposure to them. It helps to know exactly what you are allergic to so that you can avoid your specific triggers. SEEK MEDICAL CARE IF:   You have a fever.  You develop a cough that does not stop easily (persistent).  You have shortness of breath.  You start wheezing.  Symptoms interfere with normal daily activities. Document Released: 10/05/2000 Document Revised: 01/15/2013 Document Reviewed: 09/17/2012 [facility] Patient Information 2015 [place], [place]. This information is not intended to replace advice given to you by  your health care provider. Make sure you discuss any questions you have with your health care provider.  Cerumen Impaction A cerumen impaction is when the wax in your ear forms a plug. This plug usually causes reduced hearing. Sometimes it also causes an earache or dizziness. Removing a cerumen  impaction can be difficult and painful. The wax sticks to the ear canal. The canal is sensitive and bleeds easily. If you try to remove a heavy wax buildup with a cotton tipped swab, you may push it in further. Irrigation with water, suction, and small ear curettes may be used to clear out the wax. If the impaction is fixed to the skin in the ear canal, ear drops may be needed for a few days to loosen the wax. People who build up a lot of wax frequently can use ear wax removal products available in your local drugstore. SEEK MEDICAL CARE IF:  You develop an earache, increased hearing loss, or marked dizziness. Document Released: 02/18/2004 Document Revised: 04/04/2011 Document Reviewed: 04/09/2009 [facility] Patient Information 2015 [place], [place]. This information is not intended to replace advice given to you by your health care provider. Make sure you discuss any questions you have with your health care provider.

## 2014-05-08 NOTE — Progress Notes (Signed)
   Subjective:    Patient ID: Stephen Frank, male    DOB: [birth year], 24 y.o.   MRN: [id number]  HPI  Nasal congestion: Patient presents to family medicine for same-day appointment for increased mucus, coughing, nasal congestion, sneezing and runny nose for the past 3 weeks. She reports he has not been febrile, is eating and drinking well, no bloody noses. He has a history of seasonal allergies that occurs every year this time until about June. He has been taking Allegra daily. He does see an allergist on occasion, Dr. Ishmael Holter. He has been on Flonase in the past, but has not been using this. He also states that on his right septum he feels that there is a sore, no erythema or drainage coming from this orifice just painful. His mother has a history of MRSA which he lives with, but he does not.  Cerumen impaction: Patient complains of decreased hearing in his left ear. He states that he has a lot of wax in that urine has been trying to remove it with Q-tips and bobby pins. He has been unable to get the wax to large free.   Patient has never been a smoker Past Medical History  Diagnosis Date  . History of seasonal allergies   . ADHD (attention deficit hyperactivity disorder)   . Allergy   . Bipolar disorder   . H/O chlamydia infection   . Asthma   . GERD (gastroesophageal reflux disease) 04/25/2012   Allergies  Allergen Reactions  . Soy Allergy     Per the pt he eats soy products without sx.  He said he tested positive to soy on an allergy test.     Review of Systems Per history of present illness    Objective:   Physical Exam BP 148/86 mmHg  Pulse 70  Temp(Src) 98.1 F (36.7 C) (Oral)  Ht 5\' 10"  (1.778 m)  Wt 207 lb 4.8 oz (94.031 kg)  BMI 29.74 kg/m2 Gen: NAD. Well-developed, well-nourished, African-American male. Toxic in appearance. HEENT: AT. [place]. Right TM pale, otherwise normal. Unable to visualize left hand secondary to cerumen impaction. Bilateral eyes without injections or  icterus. MMM. Bilateral nares pale with swelling of turbinates, 2 mm lesion right anterior nasal septum. No erythema or drainage from breakage in skin on nasal septum. Throat without erythema or exudates. No facial pressure. Neck: Supple, no lymphadenopathy Chest: CTAB, no wheeze or crackles     Assessment & Plan:

## 2014-05-08 NOTE — Progress Notes (Signed)
I was the preceptor for this visit. 

## 2014-05-08 NOTE — Assessment & Plan Note (Signed)
Internasal lesion right anterior septum, does not appear superinfected today, but is nonhealing and sore. She has no personal history of MRSA, but lives with mom who has a MRSA infection. Discussed in detail with patient to use Bactroban ointment on the area twice a day until completely healed. If any erythema, drainage or increased pain or fever occurs he is to be seen immediately. Follow-up as needed

## 2014-05-26 ENCOUNTER — Encounter: Payer: Medicaid Other | Admitting: Family Medicine

## 2014-06-13 ENCOUNTER — Encounter: Payer: Medicaid Other | Admitting: Family Medicine

## 2014-07-07 ENCOUNTER — Ambulatory Visit (INDEPENDENT_AMBULATORY_CARE_PROVIDER_SITE_OTHER): Payer: Medicaid Other | Admitting: Family Medicine

## 2014-07-07 ENCOUNTER — Other Ambulatory Visit (HOSPITAL_COMMUNITY)
Admission: RE | Admit: 2014-07-07 | Discharge: 2014-07-07 | Disposition: A | Discharge status: Home / Self Care | Payer: Medicaid Other | Source: Ambulatory Visit | Attending: Family Medicine | Admitting: Family Medicine

## 2014-07-07 ENCOUNTER — Encounter: Payer: Self-pay | Admitting: Family Medicine

## 2014-07-07 VITALS — BP 131/77 | HR 72 | Temp 98.2°F | Ht 70.0 in | Wt 208.8 lb

## 2014-07-07 DIAGNOSIS — K219 Gastro-esophageal reflux disease without esophagitis: Secondary | ICD-10-CM | POA: Diagnosis not present

## 2014-07-07 DIAGNOSIS — Z7251 High risk heterosexual behavior: Secondary | ICD-10-CM

## 2014-07-07 DIAGNOSIS — Z113 Encounter for screening for infections with a predominantly sexual mode of transmission: Secondary | ICD-10-CM | POA: Diagnosis not present

## 2014-07-07 DIAGNOSIS — Z0000 Encounter for general adult medical examination without abnormal findings: Secondary | ICD-10-CM | POA: Diagnosis not present

## 2014-07-07 LAB — CBC
HCT: 43.3 % (ref 39.0–52.0)
Hemoglobin: 14.5 g/dL (ref 13.0–17.0)
MCH: 29.4 pg (ref 26.0–34.0)
MCHC: 33.5 g/dL (ref 30.0–36.0)
MCV: 87.8 fL (ref 78.0–100.0)
MPV: 11 fL (ref 8.6–12.4)
PLATELETS: 187 10*3/uL (ref 150–400)
RBC: 4.93 MIL/uL (ref 4.22–5.81)
RDW: 13.6 % (ref 11.5–15.5)
WBC: 4.7 10*3/uL (ref 4.0–10.5)

## 2014-07-07 NOTE — Progress Notes (Signed)
   Subjective:    Patient ID: CHINEDUM VANHOUTEN, male    DOB: [birth year], 24 y.o.   MRN: [id number]  HPI  NURI LARMER is here for f/u.   GERD: has had some mild reflux. He will schedule an appt with his GI.   High risk sexual behavior: has been sexually active with females. Unsure if he has been expose to an STD. Denies any fevers, chills, night sweats.    Current Outpatient Prescriptions on File Prior to Visit  Medication Sig Dispense Refill  . beclomethasone (QVAR) 80 MCG/ACT inhaler Inhale 2 puffs into the lungs 2 (two) times daily.    . carbamide peroxide (DEBROX) 6.5 % otic solution Place 5 drops into the left ear 2 (two) times daily. 15 mL 0  . fexofenadine (ALLEGRA) 180 MG tablet Take 180 mg by mouth daily.    . fluticasone (FLONASE) 50 MCG/ACT nasal spray Place 2 sprays into both nostrils daily. 16 g 6  . guaiFENesin (MUCINEX) 600 MG 12 hr tablet Take 1-2 tablets (600-1,200 mg total) by mouth 2 (two) times daily. 30 tablet 0  . mupirocin ointment (BACTROBAN) 2 % Place 1 application into the nose 2 (two) times daily. 22 g 0  . omeprazole (PRILOSEC) 20 MG capsule TAKE 1 CAPSULE (20 MG TOTAL) BY MOUTH DAILY BEFORE BREAKFAST. 30 capsule 2  . pantoprazole (PROTONIX) 40 MG tablet Take 1 tablet (40 mg total) by mouth daily before breakfast. 30 tablet 11   No current facility-administered medications on file prior to visit.    SHx: Going to school   Health Maintenance: none  Review of Systems See HPI     Objective:   Physical Exam BP 131/77 mmHg  Pulse 72  Temp(Src) 98.2 F (36.8 C) (Oral)  Ht 5\' 10"  (1.778 m)  Wt 208 lb 12.8 oz (94.711 kg)  BMI 29.96 kg/m2 Gen: NAD, alert, cooperative with exam, well-appearing HEENT: NCAT,  clear conjunctiva, CV: RRR, good S1/S2, no murmur, no edema, capillary refill brisk  Resp: CTABL, no wheezes, non-labored Skin: no rashes, normal turgor  Neuro: no gross deficits.        Assessment & Plan:

## 2014-07-07 NOTE — Assessment & Plan Note (Signed)
Having some mild reflux. Will schedule f/u with GI.

## 2014-07-07 NOTE — Patient Instructions (Signed)
Thank you for coming in,   I will call you with the results from today.   Please bring all of your medications with you to each visit.    Please feel free to call with any questions or concerns at any time, at [phone]. --Dr. Raeford Razor

## 2014-07-07 NOTE — Assessment & Plan Note (Signed)
He is unsure of his STD status. Denies any symptoms but has been sexually active with females.  - urine cytology

## 2014-07-08 LAB — URINE CYTOLOGY ANCILLARY ONLY
Chlamydia: NEGATIVE
Neisseria Gonorrhea: NEGATIVE

## 2014-07-08 LAB — BASIC METABOLIC PANEL WITH GFR
BUN: 11 mg/dL (ref 6–23)
CO2: 28 mEq/L (ref 19–32)
Calcium: 9.6 mg/dL (ref 8.4–10.5)
Chloride: 105 mEq/L (ref 96–112)
Creat: 0.97 mg/dL (ref 0.50–1.35)
GFR, Est Non African American: >89 mL/min
GLUCOSE: 82 mg/dL (ref 70–99)
POTASSIUM: 4.4 meq/L (ref 3.5–5.3)
Sodium: 142 mEq/L (ref 135–145)

## 2014-07-08 NOTE — Progress Notes (Signed)
I was preceptor for this office visit.  

## 2014-07-09 ENCOUNTER — Encounter: Payer: Self-pay | Admitting: Family Medicine

## 2014-07-17 ENCOUNTER — Telehealth: Payer: Self-pay | Admitting: Family Medicine

## 2014-07-17 DIAGNOSIS — K219 Gastro-esophageal reflux disease without esophagitis: Secondary | ICD-10-CM

## 2014-07-17 NOTE — Telephone Encounter (Signed)
Needs referral to Dr Carlean Purl GI dr for his chest pain Was told he had to have a referral before he can be seen there Please call back as soon as possible

## 2014-07-21 ENCOUNTER — Other Ambulatory Visit: Payer: Self-pay

## 2014-08-11 ENCOUNTER — Ambulatory Visit (INDEPENDENT_AMBULATORY_CARE_PROVIDER_SITE_OTHER): Payer: Medicaid Other | Admitting: Family Medicine

## 2014-08-11 VITALS — BP 138/76 | HR 65 | Temp 98.6°F | Wt 206.7 lb

## 2014-08-11 DIAGNOSIS — M79622 Pain in left upper arm: Secondary | ICD-10-CM

## 2014-08-11 DIAGNOSIS — M79602 Pain in left arm: Secondary | ICD-10-CM | POA: Diagnosis not present

## 2014-08-11 NOTE — Progress Notes (Signed)
Patient ID: Stephen Frank, male   DOB: 24-Oct-1990, 24 y.o.   MRN: [id number]   [facility] Aquilla Hacker, MD Phone: [phone]  Subjective:   # Left Axillary Tenderness - Pt. Is here with what he describes as tenderness when putting on deodorant in his left axilla.  - This has been ongoing for > 1 year.  - He denies history of trauma, masses, itching, burning, redness, swelling, drainage.  - He says he does not remember injuring his arm at any time even when he played football. - He has been increasing his workout routine over the past year.  - He has not had any fever, night  Sweats, weight loss.  - He has not had any symptoms on the right side.  - No rashes.  - He has not felt any lymph nodes, and he has not been sick.   All relevant systems were reviewed and were negative unless otherwise noted in the HPI  Past Medical History Reviewed problem list.  Medications- reviewed and updated Current Outpatient Prescriptions  Medication Sig Dispense Refill  . beclomethasone (QVAR) 80 MCG/ACT inhaler Inhale 2 puffs into the lungs 2 (two) times daily.    . carbamide peroxide (DEBROX) 6.5 % otic solution Place 5 drops into the left ear 2 (two) times daily. 15 mL 0  . fexofenadine (ALLEGRA) 180 MG tablet Take 180 mg by mouth daily.    . fluticasone (FLONASE) 50 MCG/ACT nasal spray Place 2 sprays into both nostrils daily. 16 g 6  . guaiFENesin (MUCINEX) 600 MG 12 hr tablet Take 1-2 tablets (600-1,200 mg total) by mouth 2 (two) times daily. 30 tablet 0  . mupirocin ointment (BACTROBAN) 2 % Place 1 application into the nose 2 (two) times daily. 22 g 0  . omeprazole (PRILOSEC) 20 MG capsule TAKE 1 CAPSULE (20 MG TOTAL) BY MOUTH DAILY BEFORE BREAKFAST. 30 capsule 2  . pantoprazole (PROTONIX) 40 MG tablet Take 1 tablet (40 mg total) by mouth daily before breakfast. 30 tablet 11   No current facility-administered medications for this visit.   Chief complaint-noted No  additions to family history Social history- patient is a non smoker  Objective: BP 138/76 mmHg  Pulse 65  Temp(Src) 98.6 F (37 C) (Oral)  Wt 206 lb 11.2 oz (93.759 kg) Gen: NAD, alert, cooperative with exam HEENT: NCAT, EOMI, PERRL Neck: FROM, supple CV: RRR, good S1/S2, no murmur Resp: CTABL, no wheezes, non-labored Abd: SNTND, BS present, no guarding or organomegaly Ext: No edema, warm, normal tone, moves UE/LE spontaneously Left Axilla: with slight tenderness over pectoralis insertion on the humerus. He has no palpable masses. No LAD. No fluctuance, induration, or erythema. No tenderness with supination or pronation of the arm, and no tenderness with abduction / adduction of the left arm actively or passively. Only slight TTP of the pec insertion.  Neuro: Alert and oriented, No gross deficits Skin: no rashes no lesions  Assessment/Plan:  # Pectoralis Tendonitis - Likely some slight inflammation of the pectoralis insertion. No masses or LAD palpable at this time.  - Advised NSAID's, Ice, Rest - F/U with PCP if not improved in 2 weeks.  - Might benefit from ultrasound vs. MRI of this area if symptoms continue.  - Advised not to increase his workouts at this time, and to rest his arm.

## 2014-08-11 NOTE — Patient Instructions (Signed)
Thanks for coming in.   You can use ibuprofen for pain and inflammation.   Ice the area every night for 1-2 weeks.   Follow up with Dr. Raeford Razor in 2-3 weeks.   If it has not improved, then we will continue to evaluate as needed.   Thanks for letting us take care of you.   Sincerely,  Paula Compton, MD Family Medicine - PGY 2

## 2014-08-12 ENCOUNTER — Encounter: Payer: Self-pay | Admitting: Internal Medicine

## 2014-08-12 ENCOUNTER — Ambulatory Visit (INDEPENDENT_AMBULATORY_CARE_PROVIDER_SITE_OTHER): Payer: Medicaid Other | Admitting: Internal Medicine

## 2014-08-12 VITALS — BP 138/80 | HR 72 | Ht 71.0 in | Wt 207.5 lb

## 2014-08-12 DIAGNOSIS — R1013 Epigastric pain: Secondary | ICD-10-CM | POA: Diagnosis not present

## 2014-08-12 DIAGNOSIS — K2100 Gastro-esophageal reflux disease with esophagitis, without bleeding: Secondary | ICD-10-CM

## 2014-08-12 MED ORDER — PANTOPRAZOLE SODIUM 40 MG PO TBEC: 40.0000 mg | DELAYED_RELEASE_TABLET | Freq: Every day | ORAL | Status: DC

## 2014-08-12 NOTE — Patient Instructions (Signed)
Stop your omeprazole and start pantoprazole which we have sent to the pharmacy for you to pick up.   Please send [place] a MyChart message back in 2 months with an update on how your doing.    I appreciate the opportunity to care for you. Silvano Rusk, MD, [facility]

## 2014-08-12 NOTE — Assessment & Plan Note (Signed)
?   If sxs from this vs gastritis or nonulcer dyspepsia Change back to pantoprazole 40 mg daily RTC 1 year - sooner prn He is to message back by My Chart if not better after 2 months

## 2014-08-12 NOTE — Progress Notes (Signed)
   Subjective:    Patient ID: Stephen Frank, male    DOB: 1990-05-11, 24 y.o.   MRN: [id number] Cc: epigastric pain HPI The patient is here for follow-up. When last seen in late 2015 he was doing well on pantoprazole 40 mg daily for reflux that was shown to be reflux with esophagitis at EGD that year. Since then he was transitioned to omeprazole 20 mg daily. I wonder if this is due to his formulary. He is not sure. At any rate he is having some epigastric gnawing-type pain that improves with eating, and occurs despite taking the omeprazole 20 mg daily faithfully. No unintentional weight loss. No nausea or vomiting. He otherwise feels well. Medications, allergies, past medical history, past surgical history, family history and social history are reviewed and updated in the EMR.   Review of Systems As above    Objective:   Physical Exam @BP  138/80 mmHg  Pulse 72  Ht 5\' 11"  (1.803 m)  Wt 207 lb 8 oz (94.121 kg)  BMI 28.95 kg/m2@  General:  NAD Eyes:   anicteric Lungs:  clear Heart::  S1S2 no rubs, murmurs or gallops Abdomen:  soft and nontender, BS+ Ext:   no edema, cyanosis or clubbing    Data Reviewed:  Previous EGD and GI notes Wt Readings from Last 3 Encounters:  08/12/14 207 lb 8 oz (94.121 kg)  08/11/14 206 lb 11.2 oz (93.759 kg)  07/07/14 208 lb 12.8 oz (94.711 kg)        Assessment & Plan:  GERD (gastroesophageal reflux disease) ? If sxs from this vs gastritis or nonulcer dyspepsia Change back to pantoprazole 40 mg daily RTC 1 year - sooner prn He is to message back by My Chart if not better after 2 months  Dyspepsia Try pantoprazole 40 mg again RTC 1 year - sooner prn Message back if not better after 2 months

## 2014-08-12 NOTE — Assessment & Plan Note (Signed)
Try pantoprazole 40 mg again RTC 1 year - sooner prn Message back if not better after 2 months

## 2014-08-15 ENCOUNTER — Encounter (HOSPITAL_COMMUNITY): Payer: Self-pay | Admitting: Emergency Medicine

## 2014-08-15 ENCOUNTER — Emergency Department (HOSPITAL_COMMUNITY)
Admission: EM | Admit: 2014-08-15 | Discharge: 2014-08-15 | Disposition: A | Discharge status: Home / Self Care | Payer: Medicaid Other | Attending: Emergency Medicine | Admitting: Emergency Medicine

## 2014-08-15 DIAGNOSIS — Z23 Encounter for immunization: Secondary | ICD-10-CM | POA: Insufficient documentation

## 2014-08-15 DIAGNOSIS — S61211A Laceration without foreign body of left index finger without damage to nail, initial encounter: Secondary | ICD-10-CM | POA: Insufficient documentation

## 2014-08-15 DIAGNOSIS — S61219A Laceration without foreign body of unspecified finger without damage to nail, initial encounter: Secondary | ICD-10-CM

## 2014-08-15 DIAGNOSIS — Y288XXA Contact with other sharp object, undetermined intent, initial encounter: Secondary | ICD-10-CM | POA: Insufficient documentation

## 2014-08-15 DIAGNOSIS — K219 Gastro-esophageal reflux disease without esophagitis: Secondary | ICD-10-CM | POA: Diagnosis not present

## 2014-08-15 DIAGNOSIS — Z8659 Personal history of other mental and behavioral disorders: Secondary | ICD-10-CM | POA: Insufficient documentation

## 2014-08-15 DIAGNOSIS — Z7951 Long term (current) use of inhaled steroids: Secondary | ICD-10-CM | POA: Diagnosis not present

## 2014-08-15 DIAGNOSIS — Y9289 Other specified places as the place of occurrence of the external cause: Secondary | ICD-10-CM | POA: Insufficient documentation

## 2014-08-15 DIAGNOSIS — Y998 Other external cause status: Secondary | ICD-10-CM | POA: Diagnosis not present

## 2014-08-15 DIAGNOSIS — Z8619 Personal history of other infectious and parasitic diseases: Secondary | ICD-10-CM | POA: Insufficient documentation

## 2014-08-15 DIAGNOSIS — Y9389 Activity, other specified: Secondary | ICD-10-CM | POA: Diagnosis not present

## 2014-08-15 DIAGNOSIS — J45909 Unspecified asthma, uncomplicated: Secondary | ICD-10-CM | POA: Insufficient documentation

## 2014-08-15 MED ORDER — LIDOCAINE HCL (PF) 1 % IJ SOLN
5.0000 mL | Freq: Once | INTRAMUSCULAR | Status: AC
  Administered 2014-08-15: 5 mL via INTRADERMAL
  Filled 2014-08-15: qty 5

## 2014-08-15 MED ORDER — TETANUS-DIPHTH-ACELL PERTUSSIS 5-2.5-18.5 LF-MCG/0.5 IM SUSP
0.5000 mL | Freq: Once | INTRAMUSCULAR | Status: AC
  Administered 2014-08-15: 0.5 mL via INTRAMUSCULAR
  Filled 2014-08-15: qty 0.5

## 2014-08-15 MED ORDER — NAPROXEN 500 MG PO TABS: 500.0000 mg | ORAL_TABLET | Freq: Two times a day (BID) | ORAL | Status: DC

## 2014-08-15 NOTE — Discharge Instructions (Signed)
Take the prescribed medication as directed. Follow-up with your primary care physician for suture removal in 1 week. Return to the ED for new or worsening symptoms.

## 2014-08-15 NOTE — ED Notes (Signed)
PT reports cutting Lt index finger @ 2 AM

## 2014-08-15 NOTE — ED Notes (Signed)
Pt. Stated, i cut my finger on a key ring last night.

## 2014-08-15 NOTE — ED Notes (Signed)
Declined W/C at D/C and was escorted to lobby by RN. 

## 2014-08-15 NOTE — ED Provider Notes (Signed)
CSN: [id number]     Arrival date & time 08/15/14  1112 History  This chart was scribed for non-physician practitioner, Larene Pickett, PA-C working with Orlie Dakin, MD by Rayna Sexton, ED scribe. This patient was seen in room TR06C/TR06C and the patient's care was started at 12:45 PM.  Chief Complaint  Patient presents with  . Finger Injury   The history is provided by the patient. No language interpreter was used.    HPI Comments: Stephen Frank is a 24 y.o. male who presents to the Emergency Department complaining of a laceration to his left index finger with onset early this morning around 0200. Pt notes cutting his finger on a key ring and further notes associated intermittent pain to the affected finger. He is unsure of when he had his last tetanus vaccination. Pt denies any other associated symptoms.   VSS.  Past Medical History  Diagnosis Date  . History of seasonal allergies   . ADHD (attention deficit hyperactivity disorder)   . Allergy   . Bipolar disorder   . H/O chlamydia infection   . Asthma   . GERD (gastroesophageal reflux disease) 04/25/2012   Past Surgical History  Procedure Laterality Date  . Tonsillectomy and adenoidectomy    . Esophagogastroduodenoscopy     Family History  Problem Relation Age of Onset  . Diabetes Mother   . Hypertension Mother   . Colon cancer Maternal Grandfather   . Hyperlipidemia Maternal Grandfather   . Diabetes Maternal Grandfather   . Diabetes Maternal Grandmother   . Esophageal cancer Neg Hx   . Rectal cancer Neg Hx   . Stomach cancer Neg Hx   . Prostate cancer Neg Hx   . Pancreatic cancer Neg Hx    History  Substance Use Topics  . Smoking status: Never Smoker   . Smokeless tobacco: Never Used  . Alcohol Use: No    Review of Systems  Skin: Positive for wound.  All other systems reviewed and are negative.  Allergies  Soy allergy  Home Medications   Prior to Admission medications   Medication Sig Start Date End  Date Taking? Authorizing Provider  beclomethasone (QVAR) 80 MCG/ACT inhaler Inhale 2 puffs into the lungs 2 (two) times daily.    Historical Provider, MD  pantoprazole (PROTONIX) 40 MG tablet Take 1 tablet (40 mg total) by mouth daily before breakfast. 08/12/14   Gatha Mayer, MD   BP 145/95 mmHg  Pulse 78  Temp(Src) 98.1 F (36.7 C) (Oral)  Resp 16  SpO2 98%   Physical Exam  Constitutional: He is oriented to person, place, and time. He appears well-developed and well-nourished. No distress.  HENT:  Head: Normocephalic and atraumatic.  Mouth/Throat: Oropharynx is clear and moist.  Eyes: Conjunctivae and EOM are normal. Pupils are equal, round, and reactive to light.  Neck: Normal range of motion. Neck supple.  Cardiovascular: Normal rate, regular rhythm and normal heart sounds.   Pulmonary/Chest: Effort normal and breath sounds normal. No respiratory distress. He has no wheezes.  Musculoskeletal: Normal range of motion. He exhibits no edema.       Right hand: He exhibits laceration. He exhibits normal range of motion, no tenderness, no bony tenderness, normal capillary refill and no swelling.       Hands: 3 cm laceration to palmar aspect of left index finger; full flexion and extension maintained; no tendon or deep tissue involvement; hand remains neurovascularly intact; leading well controlled  Neurological: He is alert and  oriented to person, place, and time.  Skin: Skin is warm and dry. He is not diaphoretic.  Psychiatric: He has a normal mood and affect.  Nursing note and vitals reviewed.   ED Course  Procedures  DIAGNOSTIC STUDIES: Oxygen Saturation is 98% on RA, normal by my interpretation.    COORDINATION OF CARE: 12:47 PM Discussed treatment plan with pt at bedside and pt agreed to plan.  LACERATION REPAIR PROCEDURE NOTE The patient's identification was confirmed and consent was obtained. This procedure was performed by Vilinda Blanks. Baird Cancer, PA-C at 1:12 PM. Site: left  index finger Sterile procedures observed Anesthetic used (type and amt): lidocaine 1% without epinephrine Suture type/size: 4-0 Prolene Length: 3 cm # of Sutures: 3 Technique: simple interrupted  Tetanus ordered Site anesthetized, irrigated with NS, explored without evidence of foreign body, wound well approximated, site covered with dry, sterile dressing.  Patient tolerated procedure well without complications. Instructions for care discussed verbally and patient provided with additional written instructions for homecare and f/u.  Labs Review Labs Reviewed - No data to display  Imaging Review No results found.   EKG Interpretation None      MDM   Final diagnoses:  Finger laceration, initial encounter   24 year old male with left index finger laceration from a key ring that occurred earlier this morning.  Full flexion/extension of finger maintained. Wound is less than 12 hours old and was repaired here in ED without complication. Tetanus was updated. Patient will follow-up with PCP in one week for suture removal.  Discussed plan with patient, he/she acknowledged understanding and agreed with plan of care.  Return precautions given for new or worsening symptoms.  I personally performed the services described in this documentation, which was scribed in my presence. The recorded information has been reviewed and is accurate.  Larene Pickett, PA-C 08/15/14 [place], MD 08/15/14 1538

## 2014-08-22 ENCOUNTER — Encounter (HOSPITAL_COMMUNITY): Payer: Self-pay | Admitting: Emergency Medicine

## 2014-08-22 ENCOUNTER — Emergency Department (HOSPITAL_COMMUNITY)
Admission: EM | Admit: 2014-08-22 | Discharge: 2014-08-22 | Disposition: A | Discharge status: Home / Self Care | Payer: Medicaid Other | Attending: Emergency Medicine | Admitting: Emergency Medicine

## 2014-08-22 DIAGNOSIS — Z8659 Personal history of other mental and behavioral disorders: Secondary | ICD-10-CM | POA: Insufficient documentation

## 2014-08-22 DIAGNOSIS — Z79899 Other long term (current) drug therapy: Secondary | ICD-10-CM | POA: Insufficient documentation

## 2014-08-22 DIAGNOSIS — Z4802 Encounter for removal of sutures: Secondary | ICD-10-CM | POA: Diagnosis present

## 2014-08-22 DIAGNOSIS — K219 Gastro-esophageal reflux disease without esophagitis: Secondary | ICD-10-CM | POA: Diagnosis not present

## 2014-08-22 DIAGNOSIS — J45909 Unspecified asthma, uncomplicated: Secondary | ICD-10-CM | POA: Diagnosis not present

## 2014-08-22 DIAGNOSIS — Z8619 Personal history of other infectious and parasitic diseases: Secondary | ICD-10-CM | POA: Diagnosis not present

## 2014-08-22 DIAGNOSIS — Z791 Long term (current) use of non-steroidal anti-inflammatories (NSAID): Secondary | ICD-10-CM | POA: Insufficient documentation

## 2014-08-22 NOTE — Discharge Instructions (Signed)

## 2014-08-22 NOTE — ED Notes (Signed)
Pt here to have stitches removed from L index finger. Has not had complications with stitches

## 2014-08-22 NOTE — ED Provider Notes (Signed)
CSN: [id number]     Arrival date & time 08/22/14  1513 History  This chart was scribed for non-physician practitioner, Maximiano Coss, PA-C, working with Ripley Fraise, MD, by Helane Gunther ED Scribe. This patient was seen in room WTR5/WTR5 and the patient's care was started at 3:19 PM    Chief Complaint  Patient presents with  . Suture / Staple Removal   The history is provided by the patient. No language interpreter was used.   HPI Comments: Stephen Frank is a 24 y.o. male who presents to the Emergency Department for suture removal. Pt had 3 Sutures placed on the left index finger on 07/22. Pt denies fever or drainage.  Past Medical History  Diagnosis Date  . History of seasonal allergies   . ADHD (attention deficit hyperactivity disorder)   . Allergy   . Bipolar disorder   . H/O chlamydia infection   . Asthma   . GERD (gastroesophageal reflux disease) 04/25/2012   Past Surgical History  Procedure Laterality Date  . Tonsillectomy and adenoidectomy    . Esophagogastroduodenoscopy     Family History  Problem Relation Age of Onset  . Diabetes Mother   . Hypertension Mother   . Colon cancer Maternal Grandfather   . Hyperlipidemia Maternal Grandfather   . Diabetes Maternal Grandfather   . Diabetes Maternal Grandmother   . Esophageal cancer Neg Hx   . Rectal cancer Neg Hx   . Stomach cancer Neg Hx   . Prostate cancer Neg Hx   . Pancreatic cancer Neg Hx    History  Substance Use Topics  . Smoking status: Never Smoker   . Smokeless tobacco: Never Used  . Alcohol Use: No    Review of Systems  Constitutional: Negative for fever.  Skin: Positive for wound.    Allergies  Soy allergy  Home Medications   Prior to Admission medications   Medication Sig Start Date End Date Taking? Authorizing Provider  aspirin-acetaminophen-caffeine (EXCEDRIN MIGRAINE) [phone] MG per tablet Take 2 tablets by mouth every 6 (six) hours as needed for headache.   Yes Historical  Provider, MD  beclomethasone (QVAR) 80 MCG/ACT inhaler Inhale 2 puffs into the lungs 2 (two) times daily as needed (wheezing and sob).    Yes Historical Provider, MD  naproxen (NAPROSYN) 500 MG tablet Take 1 tablet (500 mg total) by mouth 2 (two) times daily with a meal. 08/15/14  Yes Larene Pickett, PA-C  Naproxen Sod-Diphenhydramine (ALEVE PM) 220-25 MG TABS Take 2 tablets by mouth daily as needed (headache).   Yes Historical Provider, MD  pantoprazole (PROTONIX) 40 MG tablet Take 1 tablet (40 mg total) by mouth daily before breakfast. 08/12/14  Yes Gatha Mayer, MD   BP 149/91 mmHg  Pulse 75  Temp(Src) 97.9 F (36.6 C) (Oral)  Resp 16  SpO2 100% Physical Exam  Constitutional: He is oriented to person, place, and time. He appears well-developed and well-nourished. No distress.  HENT:  Head: Normocephalic and atraumatic.  Mouth/Throat: Oropharynx is clear and moist.  Eyes: Conjunctivae are normal.  Neck: Normal range of motion. Neck supple. No tracheal deviation present.  Cardiovascular: Normal rate.   Pulmonary/Chest: Effort normal. No respiratory distress.  Abdominal: Soft.  Musculoskeletal: Normal range of motion.  Neurological: He is alert and oriented to person, place, and time.  Skin: Skin is warm and dry.  Psychiatric: He has a normal mood and affect. His behavior is normal.  Nursing note and vitals reviewed.   ED Course  Procedures  DIAGNOSTIC STUDIES: Oxygen Saturation is 100% on RA, normal by my interpretation.    COORDINATION OF CARE: 3:22 PM - Discussed plans to discharge. Pt advised of plan for treatment and pt agrees.  SUTURE REMOVAL Performed by: Ottie Glazier, PA-C Authorized by: Ripley Fraise, MD Consent: Verbal consent obtained. Consent given by: patient Required items: required blood products, implants, devices, and special equipment available  Time out: Immediately prior to procedure a "time out" was called to verify the correct patient,  procedure, equipment, support staff and site/side marked as required. Location: Left Index Finger Wound Appearance: clean  SuturesRemoved: 3 Post-removal: No dressing or antibiotic ointment applied, wound appears to be healing well, patient had full flexion and extension of finger after removal.  There is no surroundning erythema or edema.  Patient tolerance: Patient tolerated the procedure well with no immediate complications.  Labs Review Labs Reviewed - No data to display  Imaging Review No results found.   EKG Interpretation None      MDM   Final diagnoses:  Visit for suture removal  Sutures were removed.  No signs of infection.   I personally performed the services described in this documentation, which was scribed in my presence. The recorded information has been reviewed and is accurate.   Ottie Glazier, PA-C 08/22/14 1900  Ripley Fraise, MD 08/22/14 2258

## 2014-08-26 ENCOUNTER — Ambulatory Visit (INDEPENDENT_AMBULATORY_CARE_PROVIDER_SITE_OTHER): Payer: Medicaid Other | Admitting: Family Medicine

## 2014-08-26 ENCOUNTER — Ambulatory Visit: Payer: Medicaid Other | Admitting: Family Medicine

## 2014-08-26 ENCOUNTER — Encounter: Payer: Self-pay | Admitting: Family Medicine

## 2014-08-26 VITALS — BP 164/80 | HR 82 | Temp 98.3°F | Wt 202.0 lb

## 2014-08-26 DIAGNOSIS — I10 Essential (primary) hypertension: Secondary | ICD-10-CM | POA: Diagnosis present

## 2014-08-26 MED ORDER — AMLODIPINE BESYLATE 2.5 MG PO TABS: 10.0000 mg | ORAL_TABLET | Freq: Every day | ORAL | Status: DC

## 2014-08-26 NOTE — Patient Instructions (Addendum)
Hypertension Hypertension, commonly called high blood pressure, is when the force of blood pumping through your arteries is too strong. Your arteries are the blood vessels that carry blood from your heart throughout your body. A blood pressure reading consists of a higher number over a lower number, such as 110/72. The higher number (systolic) is the pressure inside your arteries when your heart pumps. The lower number (diastolic) is the pressure inside your arteries when your heart relaxes. Ideally you want your blood pressure below 120/80. Hypertension forces your heart to work harder to pump blood. Your arteries may become narrow or stiff. Having hypertension puts you at risk for heart disease, stroke, and other problems.  RISK FACTORS Some risk factors for high blood pressure are controllable. Others are not.  Risk factors you cannot control include:   Race. You may be at higher risk if you are African American.  Age. Risk increases with age.  Gender. Men are at higher risk than women before age 45 years. After age 65, women are at higher risk than men. Risk factors you can control include:  Not getting enough exercise or physical activity.  Being overweight.  Getting too much fat, sugar, calories, or salt in your diet.  Drinking too much alcohol. SIGNS AND SYMPTOMS Hypertension does not usually cause signs or symptoms. Extremely high blood pressure (hypertensive crisis) may cause headache, anxiety, shortness of breath, and nosebleed. DIAGNOSIS  To check if you have hypertension, your health care provider will measure your blood pressure while you are seated, with your arm held at the level of your heart. It should be measured at least twice using the same arm. Certain conditions can cause a difference in blood pressure between your right and left arms. A blood pressure reading that is higher than normal on one occasion does not mean that you need treatment. If one blood pressure reading  is high, ask your health care provider about having it checked again. TREATMENT  Treating high blood pressure includes making lifestyle changes and possibly taking medicine. Living a healthy lifestyle can help lower high blood pressure. You may need to change some of your habits. Lifestyle changes may include:  Following the DASH diet. This diet is high in fruits, vegetables, and whole grains. It is low in salt, red meat, and added sugars.  Getting at least 2 hours of brisk physical activity every week.  Losing weight if necessary.  Not smoking.  Limiting alcoholic beverages.  Learning ways to reduce stress. If lifestyle changes are not enough to get your blood pressure under control, your health care provider may prescribe medicine. You may need to take more than one. Work closely with your health care provider to understand the risks and benefits. HOME CARE INSTRUCTIONS  Have your blood pressure rechecked as directed by your health care provider.   Take medicines only as directed by your health care provider. Follow the directions carefully. Blood pressure medicines must be taken as prescribed. The medicine does not work as well when you skip doses. Skipping doses also puts you at risk for problems.   Do not smoke.   Monitor your blood pressure at home as directed by your health care provider. SEEK MEDICAL CARE IF:   You think you are having a reaction to medicines taken.  You have recurrent headaches or feel dizzy.  You have swelling in your ankles.  You have trouble with your vision. SEEK IMMEDIATE MEDICAL CARE IF:  You develop a severe headache or confusion.    You have unusual weakness, numbness, or feel faint.  You have severe chest or abdominal pain.  You vomit repeatedly.  You have trouble breathing. MAKE SURE YOU:   Understand these instructions.  Will watch your condition.  Will get help right away if you are not doing well or get worse. Document  Released: 01/10/2005 Document Revised: 05/27/2013 Document Reviewed: 11/02/2012 [facility] Patient Information 2015 [place], [place]. This information is not intended to replace advice given to you by your health care provider. Make sure you discuss any questions you have with your health care provider.   Thanks for coming in today.   You have elevated blood pressure.   I've started you on Norvasc which is a blood pressure medicine. You will take one pill daily.   You need to schedule an appointment with your primary doctor to be seen in the next couple of weeks to follow up regarding your blood pressure control and if any further workup needs to be done.   Thanks for letting us take care of you!  Sincerely,  Paula Compton, MD Family Medicine - PGY 2

## 2014-08-26 NOTE — Progress Notes (Signed)
Patient ID: Stephen Frank, male   DOB: Nov 04, 1990, 24 y.o.   MRN: [id number]   [facility] Aquilla Hacker, MD Phone: [phone]  Subjective:   # Hypertension - Pt. Here because he says he is concerned that his blood pressure has been elevated during his recent visits here in the clinic and in the ED.  - Upon chart review blood presures ranging from 145 - 160 / 80's - 90's.  - He says they have also been high at home via wrist blood pressure cuff.  - No palpitations, tremors, dizziness, lightheadedness, chest pain, SOB.  - He says he has family history of HTN, DMII, but no known history of early htn.  - He says he has not had any urinary frequency.  - He does have a history of chronic migraine headaches which he gets between 10-20 times per month. He says he is not sure if these are a result of his blood pressure.  - Pt. Has had migraines for many years, and these are relieved by excedrin migraine.  - Denies family history of polycystic kidney disease or cerebral aneurysm.  - No lower extremity swelling. And no chest pain with exertion.  - Uses marijuana infrequently, but denies cocaine.   All relevant systems were reviewed and were negative unless otherwise noted in the HPI  Past Medical History Reviewed problem list.  Medications- reviewed and updated Current Outpatient Prescriptions  Medication Sig Dispense Refill  . amLODipine (NORVASC) 2.5 MG tablet Take 4 tablets (10 mg total) by mouth daily. 30 tablet 1  . aspirin-acetaminophen-caffeine (EXCEDRIN MIGRAINE) [phone] MG per tablet Take 2 tablets by mouth every 6 (six) hours as needed for headache.    . beclomethasone (QVAR) 80 MCG/ACT inhaler Inhale 2 puffs into the lungs 2 (two) times daily as needed (wheezing and sob).     . naproxen (NAPROSYN) 500 MG tablet Take 1 tablet (500 mg total) by mouth 2 (two) times daily with a meal. 30 tablet 0  . Naproxen Sod-Diphenhydramine (ALEVE PM) 220-25 MG TABS  Take 2 tablets by mouth daily as needed (headache).    . pantoprazole (PROTONIX) 40 MG tablet Take 1 tablet (40 mg total) by mouth daily before breakfast. 30 tablet 11   No current facility-administered medications for this visit.   Chief complaint-noted No additions to family history Social history- patient is a non smoker  Objective: BP 164/80 mmHg  Pulse 82  Temp(Src) 98.3 F (36.8 C) (Oral)  Wt 202 lb (91.627 kg) Gen: NAD, alert, cooperative with exam HEENT: NCAT, EOMI, PERRL Neck: FROM, supple CV: RRR, good S1/S2, no murmur, 2+ distal pulses.  Resp: CTABL, no wheezes, non-labored Abd: SNTND, BS present, no guarding or organomegaly Ext: No edema, warm, normal tone, moves UE/LE spontaneously Neuro: Alert and oriented, No gross deficits Skin: no rashes no lesions  Assessment/Plan:  # Hypertension - Pt. With multiple documented episodes of HTN. 164/80 in clinic today and 144/94 on manual repeat. May be essential htn, but he is also very young for developing HTN. May be related to migraines, but migraines may also be a symptom of secondary htn. Needs further evaluation. Recent labs without evidence of end organ damage.  - Pt. To follow up with PCP in the next couple of weeks.  - Will start low dose norvasc today given multiple documented episodes of elevated blood pressure.  - Pending discussion with pcp, may warrant further workup for htn given his young age.  - Side  effects of medication discussed, and return precautions reviewed.  - Pt. To schedule follow up appointment.

## 2014-09-10 ENCOUNTER — Ambulatory Visit: Payer: Medicaid Other | Admitting: Family Medicine

## 2014-09-18 ENCOUNTER — Ambulatory Visit (INDEPENDENT_AMBULATORY_CARE_PROVIDER_SITE_OTHER): Payer: Medicaid Other | Admitting: Family Medicine

## 2014-09-18 ENCOUNTER — Encounter: Payer: Self-pay | Admitting: Family Medicine

## 2014-09-18 VITALS — BP 141/84 | HR 83 | Temp 98.7°F | Ht 71.0 in | Wt 211.4 lb

## 2014-09-18 DIAGNOSIS — R030 Elevated blood-pressure reading, without diagnosis of hypertension: Secondary | ICD-10-CM

## 2014-09-18 DIAGNOSIS — M79641 Pain in right hand: Secondary | ICD-10-CM

## 2014-09-18 DIAGNOSIS — IMO0001 Reserved for inherently not codable concepts without codable children: Secondary | ICD-10-CM

## 2014-09-18 NOTE — Patient Instructions (Signed)
Thank you for coming in,   I think diet and exercise are great choices for having any elevated blood pressure.   Keep track of your blood pressure readings and bring them to me the next time that we meet.   I will call you with the results of the x-rays and let you know what we need to do.   Please bring all of your medications with you to each visit.    Please feel free to call with any questions or concerns at any time, at [phone]. --Dr. Raeford Razor

## 2014-09-18 NOTE — Progress Notes (Signed)
   Subjective:    Stephen Frank - 24 y.o. male MRN [id number]  Date of birth: 1990/06/22  HPI  Stephen Frank is here for elevated bp and right hand pain.  Elevated bp:  He was seen on 8/2 and was started on amlodipine 2.5 mg.  He has no hx of HTN  He was having a HA the day of the reading.  He has cut the salty food and drinking lots of water.   Right hand pain:  He reports hitting a wall with his right hand He was angry when he hit it.  This was about a month ago.  He hasn't been seen for this yet.  Pain is still occuring on his 4th tarsal distal head.  No problems moving his hand.  Pain exacerbated with flexion of his finger.    Health Maintenance:  Health Maintenance Due  Topic Date Due  . PNEUMOCOCCAL POLYSACCHARIDE VACCINE (1) 05/03/1992  . FOOT EXAM  05/03/2000  . OPHTHALMOLOGY EXAM  05/03/2000  . URINE MICROALBUMIN  05/03/2000  . HEMOGLOBIN A1C  10/23/2013  . INFLUENZA VACCINE  08/25/2014    -  reports that he has never smoked. He has never used smokeless tobacco. - Review of Systems: Per HPI.  - Past Medical History: Patient Active Problem List   Diagnosis Date Noted  . Dyspepsia 08/12/2014  . 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 Gynecomastia 08/13/2013  . Chest pain after meals 06/20/2013  . High risk sexual behavior 12/24/2012  . Well adult exam 11/06/2012  . GERD (gastroesophageal reflux disease) 04/25/2012  . Bipolar disorder 12/14/2011   - Medications: reviewed and updated Current Outpatient Prescriptions  Medication Sig Dispense Refill  . amLODipine (NORVASC) 2.5 MG tablet Take 4 tablets (10 mg total) by mouth daily. 30 tablet 1  . aspirin-acetaminophen-caffeine (EXCEDRIN MIGRAINE) [phone] MG per tablet Take 2 tablets by mouth every 6 (six) hours as needed for headache.    . beclomethasone (QVAR) 80 MCG/ACT inhaler Inhale 2 puffs into the lungs 2 (two) times daily as needed (wheezing and sob).     . naproxen (NAPROSYN) 500 MG tablet Take 1  tablet (500 mg total) by mouth 2 (two) times daily with a meal. 30 tablet 0  . Naproxen Sod-Diphenhydramine (ALEVE PM) 220-25 MG TABS Take 2 tablets by mouth daily as needed (headache).    . pantoprazole (PROTONIX) 40 MG tablet Take 1 tablet (40 mg total) by mouth daily before breakfast. 30 tablet 11   No current facility-administered medications for this visit.     Review of Systems See HPI     Objective:   Physical Exam BP 141/84 mmHg  Pulse 83  Temp(Src) 98.7 F (37.1 C) (Oral)  Ht 5\' 11"  (1.803 m)  Wt 211 lb 6.4 oz (95.89 kg)  BMI 29.50 kg/m2 Gen: NAD, alert, cooperative with exam, well-appearing CV: RRR, good S1/S2, no murmur, no edema, capillary refill brisk  MSK:  Right hand: no ecchymosis. Slight flattening of fourth knuckle of right hand  No TTP of metacarpals or carpal bones  FROM of finger adduction and abduction, extension and flexion  No pain reproduced with finger flexion or extension  neurovascularly intact     Assessment & Plan:

## 2014-09-20 DIAGNOSIS — M79641 Pain in right hand: Secondary | ICD-10-CM | POA: Insufficient documentation

## 2014-09-20 DIAGNOSIS — IMO0001 Reserved for inherently not codable concepts without codable children: Secondary | ICD-10-CM | POA: Insufficient documentation

## 2014-09-20 DIAGNOSIS — R030 Elevated blood-pressure reading, without diagnosis of hypertension: Secondary | ICD-10-CM

## 2014-09-20 NOTE — Assessment & Plan Note (Signed)
Having continued pain and will rule out a fracture  - Right hand complete x-ray  - may need to be placed in ulnar-gutter is fractured

## 2014-09-20 NOTE — Assessment & Plan Note (Signed)
Was started on medication due to having severely elevated bp at last visit.  Was started on amlodipine but didn't take them bp usually within normal range Slightly elevated today  - plan on exercising and DASH diet before having to use medication

## 2014-10-15 ENCOUNTER — Ambulatory Visit (HOSPITAL_COMMUNITY)
Admission: RE | Admit: 2014-10-15 | Discharge: 2014-10-15 | Disposition: A | Discharge status: Home / Self Care | Payer: Medicaid Other | Source: Ambulatory Visit | Attending: Family Medicine | Admitting: Family Medicine

## 2014-10-15 DIAGNOSIS — M79641 Pain in right hand: Secondary | ICD-10-CM | POA: Diagnosis not present

## 2014-10-16 ENCOUNTER — Encounter: Payer: Self-pay | Admitting: Family Medicine

## 2014-10-20 ENCOUNTER — Telehealth: Payer: Self-pay | Admitting: Family Medicine

## 2014-10-20 NOTE — Telephone Encounter (Signed)
Would like x-ray results

## 2014-10-23 NOTE — Telephone Encounter (Signed)
Imaging negative for fracture. Please inform patient.

## 2014-10-24 NOTE — Telephone Encounter (Signed)
Pt informed. Deseree Blount, CMA  

## 2014-11-02 ENCOUNTER — Emergency Department (HOSPITAL_COMMUNITY): Payer: Medicaid Other

## 2014-11-02 ENCOUNTER — Encounter (HOSPITAL_COMMUNITY): Payer: Self-pay | Admitting: *Deleted

## 2014-11-02 ENCOUNTER — Emergency Department (HOSPITAL_COMMUNITY)
Admission: EM | Admit: 2014-11-02 | Discharge: 2014-11-02 | Disposition: A | Discharge status: Home / Self Care | Payer: Medicaid Other | Attending: Emergency Medicine | Admitting: Emergency Medicine

## 2014-11-02 DIAGNOSIS — Y9289 Other specified places as the place of occurrence of the external cause: Secondary | ICD-10-CM | POA: Diagnosis not present

## 2014-11-02 DIAGNOSIS — S63619A Unspecified sprain of unspecified finger, initial encounter: Secondary | ICD-10-CM

## 2014-11-02 DIAGNOSIS — Z79899 Other long term (current) drug therapy: Secondary | ICD-10-CM | POA: Insufficient documentation

## 2014-11-02 DIAGNOSIS — Y998 Other external cause status: Secondary | ICD-10-CM | POA: Diagnosis not present

## 2014-11-02 DIAGNOSIS — K219 Gastro-esophageal reflux disease without esophagitis: Secondary | ICD-10-CM | POA: Insufficient documentation

## 2014-11-02 DIAGNOSIS — Z8659 Personal history of other mental and behavioral disorders: Secondary | ICD-10-CM | POA: Insufficient documentation

## 2014-11-02 DIAGNOSIS — S6991XA Unspecified injury of right wrist, hand and finger(s), initial encounter: Secondary | ICD-10-CM | POA: Insufficient documentation

## 2014-11-02 DIAGNOSIS — X58XXXA Exposure to other specified factors, initial encounter: Secondary | ICD-10-CM | POA: Insufficient documentation

## 2014-11-02 DIAGNOSIS — Z7982 Long term (current) use of aspirin: Secondary | ICD-10-CM | POA: Diagnosis not present

## 2014-11-02 DIAGNOSIS — Y9389 Activity, other specified: Secondary | ICD-10-CM | POA: Insufficient documentation

## 2014-11-02 DIAGNOSIS — J45909 Unspecified asthma, uncomplicated: Secondary | ICD-10-CM | POA: Insufficient documentation

## 2014-11-02 MED ORDER — IBUPROFEN 400 MG PO TABS
600.0000 mg | ORAL_TABLET | Freq: Once | ORAL | Status: AC
  Administered 2014-11-02: 600 mg via ORAL
  Filled 2014-11-02: qty 2

## 2014-11-02 MED ORDER — IBUPROFEN 600 MG PO TABS: 600.0000 mg | ORAL_TABLET | Freq: Four times a day (QID) | ORAL | Status: DC | PRN

## 2014-11-02 NOTE — Discharge Instructions (Signed)
Ice and elevate your hand. Ibuprofen for pain. Follow-up if not improving.   Jammed Finger A jammed finger is an injury to the ligaments that support your finger bones. Ligaments are strong bands of tissue that connect bones and keep them in place. This injury happens when the ligaments are stretched beyond their normal range of motion (sprained). CAUSES  A jammed finger is caused by a hard direct hit to the tip of your finger that pushes your finger toward your hand.  RISK FACTORS This injury is more likely to happen if you play sports. SYMPTOMS  Symptoms of a jammed finger include:  Pain.  Swelling.  Discoloration and bruising around the joint.  Difficulty bending or straightening the finger.  Not being able to use the finger normally. DIAGNOSIS  A jammed finger is diagnosed with a medical history and physical exam. You may also have X-rays taken to check for a broken bone (fracture).  TREATMENT  Treatment for a jammed finger may include:  Wearing a splint.  Taping the injured finger to the fingers beside it (buddy taping).  Medicines used to treat pain. Depending on the type of injury, you may have to do exercises after your finger has begun to heal. This helps you regain strength and mobility in the finger.  HOME CARE INSTRUCTIONS   Take medicines only as directed by your health care provider.  Apply ice to the injured area:   Put ice in a plastic bag.   Place a towel between your skin and the bag.   Leave the ice on for 20 minutes, 2-3 times per day.  Raise the injured area above the level of your heart while you are sitting or lying down.  Wear the splint or tape as directed by your health care provider. Remove it only as directed by your health care provider.  Rest your finger until your health care provider says you can move it again. Your finger may feel stiff and painful for a while.  Perform strengthening exercises as directed by your health care  provider. It may help to start doing these exercises with your hand in a bowl of warm water.  Keep all follow-up visits as directed by your health care provider. This is important. SEEK MEDICAL CARE IF:  You have pain or swelling that is getting worse.  Your finger feels cold.  Your finger looks out of place at the joint (deformity).  You still cannot extend your finger after treatment.  You have a fever. SEEK IMMEDIATE MEDICAL CARE IF:   Even after loosening your splint, your finger:  Is very red and swollen.  Is white or blue.  Feels tingly or becomes numb.   This information is not intended to replace advice given to you by your health care provider. Make sure you discuss any questions you have with your health care provider.   Document Released: 06/30/2009 Document Revised: 01/31/2014 Document Reviewed: 11/13/2013 Elsevier Interactive Patient Education [employer].

## 2014-11-02 NOTE — ED Provider Notes (Signed)
CSN: [id number]     Arrival date & time 11/02/14  1336 History  By signing my name below, I, Arianna Nassar, attest that this documentation has been prepared under the direction and in the presence of Vickii Volland, PA-C. Electronically Signed: Julien Nordmann, ED Scribe. 11/02/2014. 2:10 PM.    Chief Complaint  Patient presents with  . Hand Pain       The history is provided by the patient. No language interpreter was used.   HPI Comments: Stephen Frank is a 24 y.o. male who presents to the Emergency Department complaining of a constant, gradual worsening right index finger pain onset last night. Pt was moving heavy boxes at work when he twisted his finger and heard a snap. He reports pain when moving his finger side to side and he is unable to make a fist due to pain. Pt denies numbness.  Past Medical History  Diagnosis Date  . History of seasonal allergies   . ADHD (attention deficit hyperactivity disorder)   . Allergy   . Bipolar disorder ([place])   . H/O chlamydia infection   . Asthma   . GERD (gastroesophageal reflux disease) 04/25/2012   Past Surgical History  Procedure Laterality Date  . Tonsillectomy and adenoidectomy    . Esophagogastroduodenoscopy     Family History  Problem Relation Age of Onset  . Diabetes Mother   . Hypertension Mother   . Colon cancer Maternal Grandfather   . Hyperlipidemia Maternal Grandfather   . Diabetes Maternal Grandfather   . Diabetes Maternal Grandmother   . Esophageal cancer Neg Hx   . Rectal cancer Neg Hx   . Stomach cancer Neg Hx   . Prostate cancer Neg Hx   . Pancreatic cancer Neg Hx    Social History  Substance Use Topics  . Smoking status: Never Smoker   . Smokeless tobacco: Never Used  . Alcohol Use: No    Review of Systems  Musculoskeletal: Positive for arthralgias.      Allergies  Soy allergy  Home Medications   Prior to Admission medications   Medication Sig Start Date End Date Taking? Authorizing  Provider  amLODipine (NORVASC) 2.5 MG tablet Take 4 tablets (10 mg total) by mouth daily. 08/26/14   Aquilla Hacker, MD  aspirin-acetaminophen-caffeine (EXCEDRIN MIGRAINE) [phone] MG per tablet Take 2 tablets by mouth every 6 (six) hours as needed for headache.    Historical Provider, MD  beclomethasone (QVAR) 80 MCG/ACT inhaler Inhale 2 puffs into the lungs 2 (two) times daily as needed (wheezing and sob).     Historical Provider, MD  naproxen (NAPROSYN) 500 MG tablet Take 1 tablet (500 mg total) by mouth 2 (two) times daily with a meal. 08/15/14   Larene Pickett, PA-C  Naproxen Sod-Diphenhydramine (ALEVE PM) 220-25 MG TABS Take 2 tablets by mouth daily as needed (headache).    Historical Provider, MD  pantoprazole (PROTONIX) 40 MG tablet Take 1 tablet (40 mg total) by mouth daily before breakfast. 08/12/14   Gatha Mayer, MD   Triage vitals: BP 136/74 mmHg  Pulse 97  Temp(Src) 98.9 F (37.2 C) (Oral)  Resp 28  Ht 5\' 11"  (1.803 m)  Wt 221 lb 8 oz (100.472 kg)  BMI 30.91 kg/m2  SpO2 99% Physical Exam  Constitutional: He is oriented to person, place, and time. He appears well-developed and well-nourished. No distress.  HENT:  Head: Normocephalic and atraumatic.  Eyes: Right eye exhibits no discharge. Left eye exhibits no discharge.  Neck: Neck supple.  Pulmonary/Chest: Effort normal. No respiratory distress.  Musculoskeletal:  No obvious swelling or deformity noted to right hand. Tenderness to palpation over MCP joint of the right index finger. Normal finger distally, with normal color, sensation, cap refill less than 2 seconds. Full range of motion no pain with flexion and extension at DIP and PIP joints of the right index finger. Pain with rom of the MCP joint.   Neurological: He is alert and oriented to person, place, and time. Coordination normal.  Skin: No rash noted. He is not diaphoretic.  Psychiatric: He has a normal mood and affect. His behavior is normal.  Nursing note and  vitals reviewed.   ED Course  Procedures  DIAGNOSTIC STUDIES: Oxygen Saturation is 99% on RA, normal by my interpretation.  COORDINATION OF CARE:  2:09 PM Discussed treatment plan with pt at bedside and pt agreed to plan.  Labs Review Labs Reviewed - No data to display  Imaging Review Dg Finger Index Right  11/02/2014   CLINICAL DATA:  Pain of right second finger.  EXAM: RIGHT INDEX FINGER 2+V  COMPARISON:  Right hand study on 10/15/2014  FINDINGS: There is no evidence of fracture or dislocation. There is no evidence of arthropathy or other focal bone abnormality. Soft tissues are unremarkable.  IMPRESSION: Negative.   Electronically Signed   By: Aletta Edouard M.D.   On: 11/02/2014 15:03   I have personally reviewed and evaluated these images and lab results as part of my medical decision-making.   EKG Interpretation None      MDM   Final diagnoses:  Finger sprain, initial encounter    Patient with right index finger injury. X-ray obtained is negative. Neurovascular intact. Home with ibuprofen, ice, elevation. Follow up with pcp as needed.   Filed Vitals:   11/02/14 1348 11/02/14 1404  BP: 136/74   Pulse: 97   Temp: 98.9 F (37.2 C)   TempSrc: Oral   Resp: 28 18  Height: 5\' 11"  (1.803 m)   Weight: 221 lb 8 oz (100.472 kg)   SpO2: 99%    I personally performed the services described in this documentation, which was scribed in my presence. The recorded information has been reviewed and is accurate.   Jeannett Senior, PA-C 11/02/14 1606  Leonard Schwartz, MD 11/07/14 [phone]

## 2014-11-02 NOTE — ED Notes (Signed)
Patient was moving boxes and heavy equipment last night.  He now has pain in his right index finger.  Patient has not taken anything for pain today.

## 2014-11-02 NOTE — ED Notes (Signed)
Declined W/C at D/C and was escorted to lobby by RN. 

## 2014-11-04 ENCOUNTER — Ambulatory Visit (INDEPENDENT_AMBULATORY_CARE_PROVIDER_SITE_OTHER): Payer: Medicaid Other | Admitting: Family Medicine

## 2014-11-04 ENCOUNTER — Encounter: Payer: Self-pay | Admitting: Family Medicine

## 2014-11-04 VITALS — BP 145/97 | HR 76 | Temp 98.5°F | Wt 222.0 lb

## 2014-11-04 DIAGNOSIS — T148 Other injury of unspecified body region: Secondary | ICD-10-CM | POA: Diagnosis not present

## 2014-11-04 DIAGNOSIS — W57XXXA Bitten or stung by nonvenomous insect and other nonvenomous arthropods, initial encounter: Secondary | ICD-10-CM

## 2014-11-04 DIAGNOSIS — M62830 Muscle spasm of back: Secondary | ICD-10-CM | POA: Diagnosis not present

## 2014-11-04 MED ORDER — TRIAMCINOLONE ACETONIDE 0.025 % EX OINT: 1.0000 "application " | TOPICAL_OINTMENT | Freq: Two times a day (BID) | CUTANEOUS | Status: DC

## 2014-11-04 MED ORDER — CYCLOBENZAPRINE HCL 5 MG PO TABS: 5.0000 mg | ORAL_TABLET | Freq: Three times a day (TID) | ORAL | Status: DC | PRN

## 2014-11-04 NOTE — Patient Instructions (Signed)
You can take Flexeril as needed for the muscle spasm. Be careful it can make you sleepy. Stretch for approximately 15 minutes 3 times a day. Use heat as needed for pain.

## 2014-11-04 NOTE — Progress Notes (Signed)
Patient ID: Stephen Frank, male   DOB: 1990-02-14, 24 y.o.   MRN: [id number]    Subjective: CC: Back pain  HPI: Patient is a 24 y.o. male presenting to clinic today for a SDA for back pain.  Right sided shoulder and upper back pain: Started 3 days. The pain is VERY tight and achy.  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 He doesn't recall injuring the area but believes he may have tried to move something that was too heavy. He doesn't notice the pain as much when he's at rest. Pain worsened with movement. Doesn't feel weak, just feels limited by pain. No numbness/tingling. No radiation of the pain to the arm. Has had something like this before and was able to take one of his mother's Flexeril which helped significantly. Mother did not have Flexeril, she gave him one of her pain pills which he did not like as it just made him sleepy.  Bug bites: Occurred onths ago (around [place]) he noticed 3 small bug bites on his left lower back. They felt like mosquito bites. He was scatching the area. He's normally hypersensitive to bites but notes that the area is now hyperpigmented and still pruritic. Put triple antibiotic ointment and anti-itch cream on it without improvement. Not one else has them.   Social History: never smoker  ROS: All other systems reviewed and are negative.  Past Medical History Patient Active Problem List   Diagnosis Date Noted  . Back muscle spasm 11/06/2014  . Insect bite 11/06/2014  . Elevated BP 09/20/2014  . Right hand pain 09/20/2014  . Dyspepsia 08/12/2014  . 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 Gynecomastia 08/13/2013  . Chest pain after meals 06/20/2013  . GERD (gastroesophageal reflux disease) 04/25/2012  . Bipolar disorder ([place]) 12/14/2011    Medications- reviewed and updated Current Outpatient Prescriptions  Medication Sig Dispense Refill  . amLODipine (NORVASC) 2.5 MG tablet Take 4 tablets (10 mg total) by mouth daily. 30 tablet 1  .  aspirin-acetaminophen-caffeine (EXCEDRIN MIGRAINE) [phone] MG per tablet Take 2 tablets by mouth every 6 (six) hours as needed for headache.    . beclomethasone (QVAR) 80 MCG/ACT inhaler Inhale 2 puffs into the lungs 2 (two) times daily as needed (wheezing and sob).     . cyclobenzaprine (FLEXERIL) 5 MG tablet Take 1 tablet (5 mg total) by mouth 3 (three) times daily as needed for muscle spasms. 30 tablet 0  . ibuprofen (ADVIL,MOTRIN) 600 MG tablet Take 1 tablet (600 mg total) by mouth every 6 (six) hours as needed. 30 tablet 0  . naproxen (NAPROSYN) 500 MG tablet Take 1 tablet (500 mg total) by mouth 2 (two) times daily with a meal. 30 tablet 0  . Naproxen Sod-Diphenhydramine (ALEVE PM) 220-25 MG TABS Take 2 tablets by mouth daily as needed (headache).    . pantoprazole (PROTONIX) 40 MG tablet Take 1 tablet (40 mg total) by mouth daily before breakfast. 30 tablet 11  . triamcinolone (KENALOG) 0.025 % ointment Apply 1 application topically 2 (two) times daily. 30 g 0   No current facility-administered medications for this visit.    Objective: Office vital signs reviewed. BP 145/97 mmHg  Pulse 76  Temp(Src) 98.5 F (36.9 C) (Oral)  Wt 222 lb (100.699 kg)   Physical Examination:  General: Awake, alert, well- nourished, NAD MSK: Normal to inspection without ecchymoses, erythema, or swelling. Some muscular tension noted in the R lateral aspect of the neck and over the scapula. No tenderness  over the midline or on the left. Decreased ROM on the R for internal rotation, external rotation, abduction and adduction due to pain.  Normal gait and station Skin: dry, intact, no rashes or lesions Neuro: Grip strength normal and equal. Slightly decreased bicep, tricep, and deltoid strength on the R compare to the L in the upper extremities secondary to pain.  Skin: 3 hyperpigmented bumps over the L lumbar region, 1 of which had excoriations. No evidence of superimposed infection. No  erythema/warmth.  Assessment/Plan: Back muscle spasm No radicular symptoms, no involvement of the spine. Has some limitations on exam due to pain.  - Rx for Flexeril as needed; discussed drowsiness. Also discussed not taking Rx that are not prescribed to him.  - Can take Ibuprofen at home for inflammation - Ice, heat, and stretches advised.  - RTC precautions discussed: worsening pain, change in pain, radicular symptoms.   Insect bite Most likely a hypersensitivity reaction to insect bites. No other bumps and appearance is not typical of scabies- in addition, no one else around him has had these bumps.  - Discussed not scratching the region due to increase risk of superimposed infection - topical triamcinolone cream: discussed hypopigmentation and skin thinning with prolonged use. - Discussed RTC precautions: worsening symptoms, fevers.     No orders of the defined types were placed in this encounter.    Meds ordered this encounter  Medications  . triamcinolone (KENALOG) 0.025 % ointment    Sig: Apply 1 application topically 2 (two) times daily.    Dispense:  30 g    Refill:  0  . cyclobenzaprine (FLEXERIL) 5 MG tablet    Sig: Take 1 tablet (5 mg total) by mouth 3 (three) times daily as needed for muscle spasms.    Dispense:  30 tablet    Refill:  [place] PGY-2, [place]

## 2014-11-06 ENCOUNTER — Encounter: Payer: Self-pay | Admitting: Family Medicine

## 2014-11-06 DIAGNOSIS — L280 Lichen simplex chronicus: Secondary | ICD-10-CM | POA: Insufficient documentation

## 2014-11-06 DIAGNOSIS — M62830 Muscle spasm of back: Secondary | ICD-10-CM | POA: Insufficient documentation

## 2014-11-06 NOTE — Assessment & Plan Note (Signed)
Most likely a hypersensitivity reaction to insect bites. No other bumps and appearance is not typical of scabies- in addition, no one else around him has had these bumps.  - Discussed not scratching the region due to increase risk of superimposed infection - topical triamcinolone cream: discussed hypopigmentation and skin thinning with prolonged use. - Discussed RTC precautions: worsening symptoms, fevers.

## 2014-11-06 NOTE — Assessment & Plan Note (Addendum)
No radicular symptoms, no involvement of the spine. Has some limitations on exam due to pain.  - Rx for Flexeril as needed; discussed drowsiness. Also discussed not taking Rx that are not prescribed to him.  - Can take Ibuprofen at home for inflammation - Ice, heat, and stretches advised.  - RTC precautions discussed: worsening pain, change in pain, radicular symptoms.

## 2014-12-15 ENCOUNTER — Ambulatory Visit (INDEPENDENT_AMBULATORY_CARE_PROVIDER_SITE_OTHER): Payer: Medicaid Other | Admitting: Family Medicine

## 2014-12-15 ENCOUNTER — Encounter: Payer: Self-pay | Admitting: Family Medicine

## 2014-12-15 VITALS — BP 123/98 | HR 91 | Temp 98.1°F | Ht 71.0 in | Wt 231.5 lb

## 2014-12-15 DIAGNOSIS — R21 Rash and other nonspecific skin eruption: Secondary | ICD-10-CM

## 2014-12-15 DIAGNOSIS — M79644 Pain in right finger(s): Secondary | ICD-10-CM

## 2014-12-15 MED ORDER — MUPIROCIN 2 % EX OINT: 1.0000 "application " | TOPICAL_OINTMENT | Freq: Two times a day (BID) | CUTANEOUS | Status: DC

## 2014-12-15 NOTE — Patient Instructions (Signed)
Thank you for coming in,   Please let me know if there is no improvement after 5 days of use with the Bactroban.  Please return and we may need to do a punch biopsy if there is no improvement.  Please bring all of your medications with you to each visit.   Sign up for My Chart to have easy access to your labs results, and communication with your Primary care physician   Please feel free to call with any questions or concerns at any time, at [phone]. --Dr. Raeford Razor

## 2014-12-15 NOTE — Progress Notes (Signed)
Subjective:    Stephen Frank - 24 y.o. male MRN [id number]  Date of birth: 29-Jan-1990  HPI  Daron D Bueso is here for finger pain and rash.  Right first finger pain  Worse with movement  Location: dorsal aspect of first finger interweb  Pain started: when he was lifting something at work and got his finger caught in a rack. He was sliding boxes at work about a month ago.  Pain is: achy Severity: 5-7/10  Medications tried: no Recent trauma: as above  Similar pain previously: no  Symptoms Redness:no Swelling:no Fever: no Weakness: no Weight loss: no Rash: no  Rash:  Occurring on left hip and lower back  pruritic in nature  Occurred in July  Tried anti itch cream and kenalog  No exposure to bugs, travel, or other people  New dog about a year ago.    Health Maintenance:  Health Maintenance Due  Topic Date Due  . PNEUMOCOCCAL POLYSACCHARIDE VACCINE (1) 05/03/1992  . FOOT EXAM  05/03/2000  . OPHTHALMOLOGY EXAM  05/03/2000  . URINE MICROALBUMIN  05/03/2000  . HEMOGLOBIN A1C  10/23/2013    -  reports that he has never smoked. He has never used smokeless tobacco. - Review of Systems: Per HPI. - Past Medical History: Patient Active Problem List   Diagnosis Date Noted  . Finger pain, right 12/16/2014  . Back muscle spasm 11/06/2014  . Rash 11/06/2014  . Elevated BP 09/20/2014  . Dyspepsia 08/12/2014  . 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 Gynecomastia 08/13/2013  . Chest pain after meals 06/20/2013  . GERD (gastroesophageal reflux disease) 04/25/2012  . Bipolar disorder ([place]) 12/14/2011   - Medications: reviewed and updated Current Outpatient Prescriptions  Medication Sig Dispense Refill  . amLODipine (NORVASC) 2.5 MG tablet Take 4 tablets (10 mg total) by mouth daily. 30 tablet 1  . aspirin-acetaminophen-caffeine (EXCEDRIN MIGRAINE) [id number] MG per tablet Take 2 tablets by mouth every 6 (six) hours as needed for headache.    . beclomethasone (QVAR) 80  MCG/ACT inhaler Inhale 2 puffs into the lungs 2 (two) times daily as needed (wheezing and sob).     . cyclobenzaprine (FLEXERIL) 5 MG tablet Take 1 tablet (5 mg total) by mouth 3 (three) times daily as needed for muscle spasms. 30 tablet 0  . ibuprofen (ADVIL,MOTRIN) 600 MG tablet Take 1 tablet (600 mg total) by mouth every 6 (six) hours as needed. 30 tablet 0  . mupirocin ointment (BACTROBAN) 2 % Place 1 application into the nose 2 (two) times daily. For a total of 5 days 22 g 0  . naproxen (NAPROSYN) 500 MG tablet Take 1 tablet (500 mg total) by mouth 2 (two) times daily with a meal. 30 tablet 0  . Naproxen Sod-Diphenhydramine (ALEVE PM) 220-25 MG TABS Take 2 tablets by mouth daily as needed (headache).    . pantoprazole (PROTONIX) 40 MG tablet Take 1 tablet (40 mg total) by mouth daily before breakfast. 30 tablet 11  . triamcinolone (KENALOG) 0.025 % ointment Apply 1 application topically 2 (two) times daily. 30 g 0   No current facility-administered medications for this visit.     Review of Systems See HPI     Objective:   Physical Exam BP 123/98 mmHg  Pulse 91  Temp(Src) 98.1 F (36.7 C) (Oral)  Ht 5\' 11"  (1.803 m)  Wt 231 lb 8 oz (105.008 kg)  BMI 32.30 kg/m2 Gen: NAD, alert, cooperative with exam, well-appearing Skin: Indurated with no tenderness  to palpation, no fluctuance palpated Neuro: no gross deficits.  Psych: alert and oriented MSK: Thumb/hand Exam:  Laterality: right Appearance: Normal clearance with no deformity Tenderness: Tenderness to palpation on the dorsal aspect of the right index finder on the ulnar side in the inter-web between the index and middle finger Palpation: No cord or defect  Range of Motion: Normal finger abduction and abduction. Normal index finger extension and flexion. Normal index finger extension and flexion at the DIP and PIP joints.         Assessment & Plan:   Finger pain, right Occuring on dorsal aspect of right index finger.  No  deformity on exam Imaging has been negative.  Normal range of motion and strength.  Finger sprain vs strain  - if no improvement then consider referral to sports medicine for ultrasound evaluation.   Rash Initially thought to be related to a bug bite and treated with Kenalog. Has had mild improvement  has been in place for some time now. No suggestion of abscess formation  Possibly be an extension of a bug bite versus lichen sclerosus at this point - Try Bactroban and if no improvement in 5 days he will call back. - If there is no improvement may need to consider a punch biopsy

## 2014-12-16 DIAGNOSIS — M79644 Pain in right finger(s): Secondary | ICD-10-CM | POA: Insufficient documentation

## 2014-12-16 NOTE — Assessment & Plan Note (Signed)
Occuring on dorsal aspect of right index finger.  No deformity on exam Imaging has been negative.  Normal range of motion and strength.  Finger sprain vs strain  - if no improvement then consider referral to sports medicine for ultrasound evaluation.

## 2014-12-16 NOTE — Assessment & Plan Note (Signed)
Initially thought to be related to a bug bite and treated with Kenalog. Has had mild improvement  has been in place for some time now. No suggestion of abscess formation  Possibly be an extension of a bug bite versus lichen sclerosus at this point - Try Bactroban and if no improvement in 5 days he will call back. - If there is no improvement may need to consider a punch biopsy

## 2015-01-05 ENCOUNTER — Emergency Department (HOSPITAL_COMMUNITY)
Admission: EM | Admit: 2015-01-05 | Discharge: 2015-01-05 | Disposition: A | Discharge status: Home / Self Care | Payer: Medicaid Other | Attending: Emergency Medicine | Admitting: Emergency Medicine

## 2015-01-05 ENCOUNTER — Encounter (HOSPITAL_COMMUNITY): Payer: Self-pay | Admitting: Family Medicine

## 2015-01-05 DIAGNOSIS — Z8659 Personal history of other mental and behavioral disorders: Secondary | ICD-10-CM | POA: Diagnosis not present

## 2015-01-05 DIAGNOSIS — M542 Cervicalgia: Secondary | ICD-10-CM | POA: Diagnosis not present

## 2015-01-05 DIAGNOSIS — Z79899 Other long term (current) drug therapy: Secondary | ICD-10-CM | POA: Insufficient documentation

## 2015-01-05 DIAGNOSIS — R51 Headache: Secondary | ICD-10-CM | POA: Diagnosis present

## 2015-01-05 DIAGNOSIS — R519 Headache, unspecified: Secondary | ICD-10-CM

## 2015-01-05 DIAGNOSIS — J45909 Unspecified asthma, uncomplicated: Secondary | ICD-10-CM | POA: Diagnosis not present

## 2015-01-05 MED ORDER — DEXAMETHASONE SODIUM PHOSPHATE 10 MG/ML IJ SOLN
10.0000 mg | Freq: Once | INTRAMUSCULAR | Status: AC
  Administered 2015-01-05: 10 mg via INTRAVENOUS
  Filled 2015-01-05: qty 1

## 2015-01-05 MED ORDER — IBUPROFEN 800 MG PO TABS: 800.0000 mg | ORAL_TABLET | Freq: Three times a day (TID) | ORAL | Status: DC

## 2015-01-05 MED ORDER — KETOROLAC TROMETHAMINE 30 MG/ML IJ SOLN
30.0000 mg | Freq: Once | INTRAMUSCULAR | Status: AC
  Administered 2015-01-05: 30 mg via INTRAVENOUS
  Filled 2015-01-05: qty 1

## 2015-01-05 MED ORDER — METOCLOPRAMIDE HCL 5 MG/ML IJ SOLN
10.0000 mg | Freq: Once | INTRAMUSCULAR | Status: AC
  Administered 2015-01-05: 10 mg via INTRAVENOUS
  Filled 2015-01-05: qty 2

## 2015-01-05 MED ORDER — NAPROXEN 500 MG PO TABS: 500.0000 mg | ORAL_TABLET | Freq: Two times a day (BID) | ORAL | Status: DC

## 2015-01-05 NOTE — ED Notes (Signed)
Pt here for headache over the past weekend. [place] radiating into neck. sts hx of same.

## 2015-01-05 NOTE — Discharge Instructions (Signed)
° °  General Headache Without Cause Follow-up with neurology. Return for vision changes, fever, increased headache. A headache is pain or discomfort felt around the head or neck area. The specific cause of a headache may not be found. There are many causes and types of headaches. A few common ones are:  Tension headaches.  Migraine headaches.  Cluster headaches.  Chronic daily headaches. HOME CARE INSTRUCTIONS  Watch your condition for any changes. Take these steps to help with your condition: Managing Pain  Take over-the-counter and prescription medicines only as told by your health care provider.  Lie down in a dark, quiet room when you have a headache.  If directed, apply ice to the head and neck area:  Put ice in a plastic bag.  Place a towel between your skin and the bag.  Leave the ice on for 20 minutes, 2-3 times per day.  Use a heating pad or hot shower to apply heat to the head and neck area as told by your health care provider.  Keep lights dim if bright lights bother you or make your headaches worse. Eating and Drinking  Eat meals on a regular schedule.  Limit alcohol use.  Decrease the amount of caffeine you drink, or stop drinking caffeine. General Instructions  Keep all follow-up visits as told by your health care provider. This is important.  Keep a headache journal to help find out what may trigger your headaches. For example, write down:  What you eat and drink.  How much sleep you get.  Any change to your diet or medicines.  Try massage or other relaxation techniques.  Limit stress.  Sit up straight, and do not tense your muscles.  Do not use tobacco products, including cigarettes, chewing tobacco, or e-cigarettes. If you need help quitting, ask your health care provider.  Exercise regularly as told by your health care provider.  Sleep on a regular schedule. Get 7-9 hours of sleep, or the amount recommended by your health care  provider. SEEK MEDICAL CARE IF:   Your symptoms are not helped by medicine.  You have a headache that is different from the usual headache.  You have nausea or you vomit.  You have a fever. SEEK IMMEDIATE MEDICAL CARE IF:   Your headache becomes severe.  You have repeated vomiting.  You have a stiff neck.  You have a loss of vision.  You have problems with speech.  You have pain in the eye or ear.  You have muscular weakness or loss of muscle control.  You lose your balance or have trouble walking.  You feel faint or pass out.  You have confusion.   This information is not intended to replace advice given to you by your health care provider. Make sure you discuss any questions you have with your health care provider.   Document Released: 01/10/2005 Document Revised: 10/01/2014 Document Reviewed: 05/05/2014 Elsevier Interactive Patient Education [employer].

## 2015-01-05 NOTE — ED Provider Notes (Signed)
CSN: [id number]     Arrival date & time 01/05/15  1140 History  By signing my name below, I, [name], attest that this documentation has been prepared under the direction and in the presence of [employer], PA-C. Electronically Signed: [name], ED Scribe. 01/05/2015. 1:19 PM.   Chief Complaint  Patient presents with  . Headache  . Neck Pain   The history is provided by the patient. No language interpreter was used.     HPI Comments: Stephen Frank is a 24 y.o. male who presents to the Emergency Department complaining of gradual onset, intermittent, throbbing, right parietal headache radiating into his posterior neck x 1 week. Pt reports that the headache will subside for about 20 minutes but then returns. He has been taking Excedrin Migraine without relief. Pt has hx of headaches but states it has been worse this past week. Pt was told by his PCP that is his headache is persistent then he should come to the ED to get an MRI. He has had multiple images done in the past, including an MR Brain in 2009 with no acute findings. Denies visual changes, fever, chills, or any other associated symptoms.   Past Medical History  Diagnosis Date  . History of seasonal allergies   . ADHD (attention deficit hyperactivity disorder)   . Allergy   . Bipolar disorder ([place])   . H/O chlamydia infection   . Asthma   . GERD (gastroesophageal reflux disease) 04/25/2012   Past Surgical History  Procedure Laterality Date  . Tonsillectomy and adenoidectomy    . Esophagogastroduodenoscopy     Family History  Problem Relation Age of Onset  . Diabetes Mother   . Hypertension Mother   . Colon cancer Maternal Grandfather   . Hyperlipidemia Maternal Grandfather   . Diabetes Maternal Grandfather   . Diabetes Maternal Grandmother   . Esophageal cancer Neg Hx   . Rectal cancer Neg Hx   . Stomach cancer Neg Hx   . Prostate cancer Neg Hx   . Pancreatic cancer Neg Hx    Social History   Substance Use Topics  . Smoking status: Never Smoker   . Smokeless tobacco: Never Used  . Alcohol Use: No    Review of Systems  Constitutional: Negative for fever and chills.  Eyes: Negative for visual disturbance.  Musculoskeletal: Positive for neck pain.  Neurological: Positive for headaches.  All other systems reviewed and are negative.  Allergies  Soy allergy  Home Medications   Prior to Admission medications   Medication Sig Start Date End Date Taking? Authorizing Provider  amLODipine (NORVASC) 2.5 MG tablet Take 4 tablets (10 mg total) by mouth daily. 08/26/14   Aquilla Hacker, MD  aspirin-acetaminophen-caffeine (EXCEDRIN MIGRAINE) [phone] MG per tablet Take 2 tablets by mouth every 6 (six) hours as needed for headache.    Historical Provider, MD  beclomethasone (QVAR) 80 MCG/ACT inhaler Inhale 2 puffs into the lungs 2 (two) times daily as needed (wheezing and sob).     Historical Provider, MD  cyclobenzaprine (FLEXERIL) 5 MG tablet Take 1 tablet (5 mg total) by mouth 3 (three) times daily as needed for muscle spasms. 11/04/14   Archie Patten, MD  ibuprofen (ADVIL,MOTRIN) 800 MG tablet Take 1 tablet (800 mg total) by mouth 3 (three) times daily. 01/05/15   Eather Chaires Patel-Mills, PA-C  mupirocin ointment (BACTROBAN) 2 % Place 1 application into the nose 2 (two) times daily. For a total of 5 days 12/15/14  Rosemarie Ax, MD  Naproxen Sod-Diphenhydramine (ALEVE PM) 220-25 MG TABS Take 2 tablets by mouth daily as needed (headache).    Historical Provider, MD  pantoprazole (PROTONIX) 40 MG tablet Take 1 tablet (40 mg total) by mouth daily before breakfast. 08/12/14   Gatha Mayer, MD  triamcinolone (KENALOG) 0.025 % ointment Apply 1 application topically 2 (two) times daily. 11/04/14   Archie Patten, MD   Triage Vitals: BP 132/79 mmHg  Pulse 66  Temp(Src) 98.1 F (36.7 C) (Oral)  Resp 16  Ht 5\' 10"  (1.778 m)  Wt 233 lb (105.688 kg)  BMI 33.43 kg/m2  SpO2 100%    Physical Exam  Constitutional: He is oriented to person, place, and time. He appears well-developed and well-nourished. No distress.  HENT:  Head: Normocephalic and atraumatic.  No temporal pain  Eyes: Conjunctivae and EOM are normal.  Pt is wearing glasses  Neck: Neck supple. No tracheal deviation present.  Has full ROM of his neck without pain Able to touch his chin to his chest without pain No midline cervical or para verterbral tenderness  Cardiovascular: Normal rate and regular rhythm.   Pulmonary/Chest: Effort normal. No respiratory distress.  Musculoskeletal: Normal range of motion.  Neurological: He is alert and oriented to person, place, and time. He has normal strength. No sensory deficit. Gait normal. GCS eye subscore is 4. GCS verbal subscore is 5. GCS motor subscore is 6.  CN III-XII are intact Ambulatory with a steady gait. GCS 15. Normal motor. Normal gait. No sensory deficit.  Skin: Skin is warm and dry.  Psychiatric: He has a normal mood and affect. His behavior is normal.  Nursing note and vitals reviewed.   ED Course  Procedures (including critical care time)  DIAGNOSTIC STUDIES: Oxygen Saturation is 100% on RA, normal by my interpretation.    COORDINATION OF CARE: 1:16 PM-Discussed treatment plan which includes Toradol injection, Reglan injection, and Decadron injection with pt at bedside and pt agreed to plan.   Labs Review Labs Reviewed - No data to display  Imaging Review No results found.   EKG Interpretation None      MDM   Final diagnoses:  Acute nonintractable headache, unspecified headache type  Pt presents to the ED for right parietal headache radiating into neck x 1 week.   Pt HA treated and improved while in ED.  Presentation is like pts typical HA and non concerning for [facility], ICH, Meningitis, or temporal arteritis. Pt is afebrile with no focal neuro deficits, nuchal rigidity, or change in vision. Pt verbalizes understanding and is  agreeable with plan to dc.  Recheck: Patient states that he is feeling much better after headache cocktail. I discussed follow-up with neurology due to recurrent headaches. I also discussed return precautions the patient and he verbally agrees with the plan. Rx: Ibuprofen. Filed Vitals:   01/05/15 1248  BP: 132/79  Pulse: 66  Temp: 98.1 F (36.7 C)  Resp: 16    I personally performed the services described in this documentation, which was scribed in my presence. The recorded information has been reviewed and is accurate.      Ottie Glazier, PA-C 01/05/15 1546  Charlesetta Shanks, MD 01/06/15 [phone]

## 2015-01-08 ENCOUNTER — Encounter: Payer: Self-pay | Admitting: Family Medicine

## 2015-01-08 ENCOUNTER — Ambulatory Visit (INDEPENDENT_AMBULATORY_CARE_PROVIDER_SITE_OTHER): Payer: Medicaid Other | Admitting: Family Medicine

## 2015-01-08 VITALS — BP 140/67 | HR 80 | Temp 97.7°F | Ht 71.0 in | Wt 241.5 lb

## 2015-01-08 DIAGNOSIS — R21 Rash and other nonspecific skin eruption: Secondary | ICD-10-CM

## 2015-01-08 NOTE — Progress Notes (Signed)
Subjective:    Stephen Frank - 24 y.o. male MRN [id number]  Date of birth: 09/05/1990  HPI  Stephen Frank is here for finger pain and rash.  Rash:  Occurring on left hip and lower back  pruritic in nature  Occurred in July  Tried anti itch cream and kenalog  No exposure to bugs, travel, or other people  New dog about a year ago.  Has continued rash despite treatment    Health Maintenance:  Health Maintenance Due  Topic Date Due  . PNEUMOCOCCAL POLYSACCHARIDE VACCINE (1) 05/03/1992  . FOOT EXAM  05/03/2000  . OPHTHALMOLOGY EXAM  05/03/2000  . URINE MICROALBUMIN  05/03/2000  . HEMOGLOBIN A1C  10/23/2013    -  reports that he has never smoked. He has never used smokeless tobacco. - Review of Systems: Per HPI. - Past Medical History: Patient Active Problem List   Diagnosis Date Noted  . Finger pain, right 12/16/2014  . Back muscle spasm 11/06/2014  . Rash 11/06/2014  . Elevated BP 09/20/2014  . Dyspepsia 08/12/2014  . 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 Gynecomastia 08/13/2013  . Chest pain after meals 06/20/2013  . GERD (gastroesophageal reflux disease) 04/25/2012  . Bipolar disorder ([place]) 12/14/2011   - Medications: reviewed and updated Current Outpatient Prescriptions  Medication Sig Dispense Refill  . amLODipine (NORVASC) 2.5 MG tablet Take 4 tablets (10 mg total) by mouth daily. 30 tablet 1  . aspirin-acetaminophen-caffeine (EXCEDRIN MIGRAINE) [id number] MG per tablet Take 2 tablets by mouth every 6 (six) hours as needed for headache.    . beclomethasone (QVAR) 80 MCG/ACT inhaler Inhale 2 puffs into the lungs 2 (two) times daily as needed (wheezing and sob).     . cyclobenzaprine (FLEXERIL) 5 MG tablet Take 1 tablet (5 mg total) by mouth 3 (three) times daily as needed for muscle spasms. 30 tablet 0  . ibuprofen (ADVIL,MOTRIN) 800 MG tablet Take 1 tablet (800 mg total) by mouth 3 (three) times daily. 21 tablet 0  . mupirocin ointment (BACTROBAN) 2 % Place  1 application into the nose 2 (two) times daily. For a total of 5 days 22 g 0  . Naproxen Sod-Diphenhydramine (ALEVE PM) 220-25 MG TABS Take 2 tablets by mouth daily as needed (headache).    . pantoprazole (PROTONIX) 40 MG tablet Take 1 tablet (40 mg total) by mouth daily before breakfast. 30 tablet 11  . triamcinolone (KENALOG) 0.025 % ointment Apply 1 application topically 2 (two) times daily. 30 g 0   No current facility-administered medications for this visit.     Review of Systems See HPI     Objective:   Physical Exam BP 140/67 mmHg  Pulse 80  Temp(Src) 97.7 F (36.5 C) (Oral)  Ht 5\' 11"  (1.803 m)  Wt 241 lb 8 oz (109.544 kg)  BMI 33.70 kg/m2 Gen: NAD, alert, cooperative with exam, well-appearing Skin: Indurated with no tenderness to palpation, no fluctuance palpated Neuro: no gross deficits.  Psych: alert and oriented     Skin Punch Biopsy: left lower back  Consent obtained and time out performed. Area cleaned with alcohol and 1.5 ml of 1% lidocaine with epi injected in the skin. Then area cleaned with betadine.  Then a 4 ml punch biopsy used to remove a portion of the lesion with a border or normal appearing skin. The circle of skin was then removed with forceps. A dressing was applied. No bleeding, and the patient did well.  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

## 2015-01-08 NOTE — Patient Instructions (Signed)
Thank you for coming in,   Leave it covered for the first 24 hours.   Then you can let water run over it after that.   Please bring all of your medications with you to each visit.   Sign up for My Chart to have easy access to your labs results, and communication with your Primary care physician   Please feel free to call with any questions or concerns at any time, at [phone]. --Dr. Raeford Razor Skin Biopsy WHY AM I HAVING THIS TEST? This test examines skin tissue under a microscope. Your health care provider may perform this test to identify the source of a skin rash or lesion if an allergy is suspected. In this test, a tissue sample (biopsy) is taken to look at your skin's anatomy and to see if certain proteins and antibodies are present. WHAT KIND OF SAMPLE IS TAKEN? A small sample of skin is required for this test. It is usually collected by numbing an area of skin and removing a small circle of skin. HOW DO I PREPARE FOR THE TEST? There is no preparation required for this test. HOW ARE THE TEST RESULTS REPORTED? Your results may be reported in different ways and are dependent upon the kind of test that is performed. It is your responsibility to obtain your test results. Ask the lab or department performing the test when and how you will get your results. It is most common for your results to be reported as:  Normal skin anatomy (histology).  No evidence of IgG, IgA, or IgM antibody, complement C3, or fibrinogen. WHAT DO THE RESULTS MEAN? Abnormal findings may indicate certain autoimmune diseases. This test can produce many different results. It is important to talk with your health care provider to discuss your results, treatment options, and if necessary, the need for more tests. Talk with your health care provider if you have any questions about your results.   This information is not intended to replace advice given to you by your health care provider. Make sure you discuss any  questions you have with your health care provider.   Document Released: 02/12/2004 Document Revised: 01/31/2014 Document Reviewed: 04/09/2014 Elsevier Interactive Patient Education [employer].

## 2015-01-08 NOTE — Assessment & Plan Note (Signed)
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

## 2015-01-20 ENCOUNTER — Encounter: Payer: Self-pay | Admitting: Family Medicine

## 2015-01-30 ENCOUNTER — Encounter: Payer: Self-pay | Admitting: Family Medicine

## 2015-01-30 ENCOUNTER — Ambulatory Visit (INDEPENDENT_AMBULATORY_CARE_PROVIDER_SITE_OTHER): Payer: Medicaid Other | Admitting: Family Medicine

## 2015-01-30 VITALS — BP 122/78 | HR 78 | Temp 98.0°F | Ht 71.0 in | Wt 242.0 lb

## 2015-01-30 DIAGNOSIS — L280 Lichen simplex chronicus: Secondary | ICD-10-CM | POA: Diagnosis present

## 2015-01-30 NOTE — Progress Notes (Signed)
   Subjective:    Stephen Frank - 25 y.o. male MRN [id number]  Date of birth: Dec 28, 1990  HPI  Stephen Frank is here for skin problems.   Chronic lichen simplex Most recent pathology report of the skin biopsy showing chronic lichen simplex. He's had no improvement with current treatment strategies. He would like a referral to dermatology to have an evaluation. The skin problems have not changed for the worse at all.   Health Maintenance:  Health Maintenance Due  Topic Date Due  . PNEUMOCOCCAL POLYSACCHARIDE VACCINE (1) 05/03/1992  . FOOT EXAM  05/03/2000  . OPHTHALMOLOGY EXAM  05/03/2000  . URINE MICROALBUMIN  05/03/2000  . HEMOGLOBIN A1C  10/23/2013    -  reports that he has never smoked. He has never used smokeless tobacco. - Review of Systems: Per HPI. - Past Medical History: Patient Active Problem List   Diagnosis Date Noted  . Finger pain, right 12/16/2014  . Back muscle spasm 11/06/2014  . Lichen simplex, chronic 11/06/2014  . Elevated BP 09/20/2014  . Dyspepsia 08/12/2014  . 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 Gynecomastia 08/13/2013  . Chest pain after meals 06/20/2013  . GERD (gastroesophageal reflux disease) 04/25/2012  . Bipolar disorder ([place]) 12/14/2011   - Medications: reviewed and updated Current Outpatient Prescriptions  Medication Sig Dispense Refill  . amLODipine (NORVASC) 2.5 MG tablet Take 4 tablets (10 mg total) by mouth daily. 30 tablet 1  . aspirin-acetaminophen-caffeine (EXCEDRIN MIGRAINE) [id number] MG per tablet Take 2 tablets by mouth every 6 (six) hours as needed for headache.    . beclomethasone (QVAR) 80 MCG/ACT inhaler Inhale 2 puffs into the lungs 2 (two) times daily as needed (wheezing and sob).     . cyclobenzaprine (FLEXERIL) 5 MG tablet Take 1 tablet (5 mg total) by mouth 3 (three) times daily as needed for muscle spasms. 30 tablet 0  . ibuprofen (ADVIL,MOTRIN) 800 MG tablet Take 1 tablet (800 mg total) by mouth 3 (three) times  daily. 21 tablet 0  . mupirocin ointment (BACTROBAN) 2 % Place 1 application into the nose 2 (two) times daily. For a total of 5 days 22 g 0  . Naproxen Sod-Diphenhydramine (ALEVE PM) 220-25 MG TABS Take 2 tablets by mouth daily as needed (headache).    . pantoprazole (PROTONIX) 40 MG tablet Take 1 tablet (40 mg total) by mouth daily before breakfast. 30 tablet 11  . triamcinolone (KENALOG) 0.025 % ointment Apply 1 application topically 2 (two) times daily. 30 g 0   No current facility-administered medications for this visit.     Review of Systems See HPI     Objective:   Physical Exam BP 122/78 mmHg  Pulse 78  Temp(Src) 98 F (36.7 C) (Oral)  Ht 5\' 11"  (1.803 m)  Wt 242 lb (109.77 kg)  BMI 33.77 kg/m2  SpO2 98% Gen: NAD, alert, cooperative with exam,  Skin: lesions on left lower back that is hyperpigmented and no erythema. Neuro: no gross deficits.     Assessment & Plan:   Lichen simplex, chronic Pathology report showing chronic lichen simplex. Have not shown any improvement with current treatment options. - Referral placed to dermatology

## 2015-01-30 NOTE — Patient Instructions (Signed)
Thank you for coming in,   Let me know if you don't hear from the Dermatologist   Please bring all of your medications with you to each visit.   Sign up for My Chart to have easy access to your labs results, and communication with your Primary care physician   Please feel free to call with any questions or concerns at any time, at [phone]. --Dr. Loura Halt

## 2015-01-30 NOTE — Assessment & Plan Note (Signed)
Pathology report showing chronic lichen simplex. Have not shown any improvement with current treatment options. - Referral placed to dermatology

## 2015-02-09 ENCOUNTER — Ambulatory Visit: Payer: Medicaid Other | Admitting: Neurology

## 2015-02-24 ENCOUNTER — Encounter: Payer: Self-pay | Admitting: Family Medicine

## 2015-02-24 ENCOUNTER — Ambulatory Visit (INDEPENDENT_AMBULATORY_CARE_PROVIDER_SITE_OTHER): Payer: Medicaid Other | Admitting: Family Medicine

## 2015-02-24 VITALS — BP 145/77 | HR 85 | Temp 97.9°F | Wt 233.1 lb

## 2015-02-24 DIAGNOSIS — R238 Other skin changes: Secondary | ICD-10-CM | POA: Diagnosis not present

## 2015-02-24 DIAGNOSIS — R21 Rash and other nonspecific skin eruption: Secondary | ICD-10-CM | POA: Insufficient documentation

## 2015-02-24 NOTE — Assessment & Plan Note (Signed)
Appears to be impetigo vs folliculitis  Possible for HSV  - try bactroban and look for improvement  - if no improvement, would try to unroof one of the vesicles and obtain a viral culture.

## 2015-02-24 NOTE — Patient Instructions (Signed)
Thank you for coming in,   Try the bactroban for a while.   It could be impetigo vs folliculitis.   Please bring all of your medications with you to each visit.   Sign up for My Chart to have easy access to your labs results, and communication with your Primary care physician   Please feel free to call with any questions or concerns at any time, at [phone]. --Dr. Raeford Razor Impetigo, Adult Impetigo is an infection of the skin. It commonly occurs in young children, but it can also occur in adults. The infection causes itchy blisters and sores that produce brownish-yellow fluid. As the fluid dries, it forms a thick, honey-colored crust. These skin changes usually occur on the face but can also affect other areas of the body. Impetigo usually goes away in 7-10 days with treatment. CAUSES Impetigo is caused by two types of bacteria. It may be caused by staphylococci or streptococci bacteria. These bacteria cause impetigo when they get under the surface of the skin. This often happens after some damage to the skin, such as damage from:  Cuts, scrapes, or scratches.  Insect bites, especially when you scratch the area of a bite.  Chickenpox or other illnesses that cause open skin sores.  Nail biting or chewing. Impetigo is contagious and can spread easily from one person to another. This may occur through close skin contact or by sharing towels, clothing, or other items with a person who has the infection. RISK FACTORS Some things that can increase the risk of getting this infection include:  Playing sports that include skin-to-skin contact with others.  Having a skin condition with open sores.  Having many skin cuts or scrapes.  Living in an area that has high humidity levels.  Having poor hygiene.  Having high levels of staphylococci in your nose. SIGNS AND SYMPTOMS Impetigo usually starts out as small blisters, often on the face. The blisters then break open and turn into tiny sores  (lesions) with a yellow crust. In some cases, the blisters cause itching or burning. With scratching, irritation, or lack of treatment, these small lesions may get larger. Scratching can also cause impetigo to spread to other parts of the body. The bacteria can get under the fingernails and spread when you touch another area of your skin. Other possible symptoms include:  Larger blisters.  Pus.  Swollen lymph glands. DIAGNOSIS This condition is usually diagnosed during a physical exam. A skin sample or sample of fluid from a blister may be taken for lab tests that involve growing bacteria (culture test). This can help confirm the diagnosis or help determine the best treatment. TREATMENT Mild impetigo can be treated with prescription antibiotic cream. Oral antibiotic medicine may be used in more severe cases. Medicines for itching may also be used. HOME CARE INSTRUCTIONS  Take medicines only as directed by your health care provider.  To help prevent impetigo from spreading to other body areas:  Keep your fingernails short and clean.  Do not scratch the blisters or sores.  Cover infected areas, if necessary, to keep from scratching.  Gently wash the infected areas with antibiotic soap and water.  Soak crusted areas in warm, soapy water using antibiotic soap.  Gently rub the areas to remove crusts. Do not scrub.  Wash your hands often to avoid spreading this infection.  Stay home until you have used an antibiotic cream for 48 hours (2 days) or an oral antibiotic medicine for 24 hours (1 day). You  should only return to work and activities with other people if your skin shows significant improvement. PREVENTION  To keep the infection from spreading:  Stay home until you have used an antibiotic cream for 48 hours or an oral antibiotic for 24 hours.  Wash your hands often.  Do not engage in skin-to-skin contact with other people while you have still have blisters.  Do not share  towels, washcloths, or bedding with others while you have the infection. SEEK MEDICAL CARE IF:  You develop more blisters or sores despite treatment.  Other family members get sores.  Your skin sores are not improving after 48 hours of treatment.  You have a fever. SEEK IMMEDIATE MEDICAL CARE IF:  You see spreading redness or swelling of the skin around your sores.  You see red streaks coming from your sores.  You develop a sore throat.   This information is not intended to replace advice given to you by your health care provider. Make sure you discuss any questions you have with your health care provider.   Document Released: 01/31/2014 Document Reviewed: 01/31/2014 Elsevier Interactive Patient Education [employer].

## 2015-02-24 NOTE — Progress Notes (Signed)
Subjective:    Stephen Frank - 25 y.o. male MRN [id number]  Date of birth: January 22, 1991  HPI  Caswell D Vose is here for rash on face.  Rash on Face:  Started on January 6  Has some vesicles on his left upper lip and nasolabial fold Denies any fevers or chills  Staying about the same or a little better Has tried relieve cold sore treatment and warm rags  Hasn't happened like this before.  Has a history of cold sore but nothing like this has happened before.  Not itching or painful.  Not draining  Doesn't appear to be related to eating or drinking.  Doesn't smoke on a regular basis. No marijuana.  History of eczema and is controlled now.   Health Maintenance:  Health Maintenance Due  Topic Date Due  . PNEUMOCOCCAL POLYSACCHARIDE VACCINE (1) 05/03/1992  . FOOT EXAM  05/03/2000  . OPHTHALMOLOGY EXAM  05/03/2000  . URINE MICROALBUMIN  05/03/2000  . HEMOGLOBIN A1C  10/23/2013    -  reports that he has never smoked. He has never used smokeless tobacco. - Review of Systems: Per HPI. - Past Medical History: Patient Active Problem List   Diagnosis Date Noted  . Vesicles 02/24/2015  . Finger pain, right 12/16/2014  . Back muscle spasm 11/06/2014  . Lichen simplex, chronic 11/06/2014  . Elevated BP 09/20/2014  . Dyspepsia 08/12/2014  . 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 Gynecomastia 08/13/2013  . Chest pain after meals 06/20/2013  . GERD (gastroesophageal reflux disease) 04/25/2012  . Bipolar disorder ([place]) 12/14/2011   - Medications: reviewed and updated Current Outpatient Prescriptions  Medication Sig Dispense Refill  . amLODipine (NORVASC) 2.5 MG tablet Take 4 tablets (10 mg total) by mouth daily. 30 tablet 1  . aspirin-acetaminophen-caffeine (EXCEDRIN MIGRAINE) [id number] MG per tablet Take 2 tablets by mouth every 6 (six) hours as needed for headache.    . beclomethasone (QVAR) 80 MCG/ACT inhaler Inhale 2 puffs into the lungs 2 (two) times daily as needed  (wheezing and sob).     . cyclobenzaprine (FLEXERIL) 5 MG tablet Take 1 tablet (5 mg total) by mouth 3 (three) times daily as needed for muscle spasms. 30 tablet 0  . ibuprofen (ADVIL,MOTRIN) 800 MG tablet Take 1 tablet (800 mg total) by mouth 3 (three) times daily. 21 tablet 0  . mupirocin ointment (BACTROBAN) 2 % Place 1 application into the nose 2 (two) times daily. For a total of 5 days 22 g 0  . Naproxen Sod-Diphenhydramine (ALEVE PM) 220-25 MG TABS Take 2 tablets by mouth daily as needed (headache).    . pantoprazole (PROTONIX) 40 MG tablet Take 1 tablet (40 mg total) by mouth daily before breakfast. 30 tablet 11  . triamcinolone (KENALOG) 0.025 % ointment Apply 1 application topically 2 (two) times daily. 30 g 0   No current facility-administered medications for this visit.     Review of Systems See HPI     Objective:   Physical Exam BP 145/77 mmHg  Pulse 85  Temp(Src) 97.9 F (36.6 C) (Oral)  Wt 233 lb 1.6 oz (105.733 kg) Gen: NAD, alert, cooperative with exam, well-appearing HEENT: vesicular lesions on upper lip mainly within the hair of his upper lip, no oral lesions         Assessment & Plan:   Vesicles Appears to be impetigo vs folliculitis  Possible for HSV  - try bactroban and look for improvement  - if no improvement, would try to  unroof one of the vesicles and obtain a viral culture.

## 2015-04-01 ENCOUNTER — Other Ambulatory Visit: Payer: Self-pay | Admitting: Allergy and Immunology

## 2015-04-21 ENCOUNTER — Encounter: Payer: Self-pay | Admitting: Family Medicine

## 2015-04-21 ENCOUNTER — Ambulatory Visit (INDEPENDENT_AMBULATORY_CARE_PROVIDER_SITE_OTHER): Payer: Medicaid Other | Admitting: Family Medicine

## 2015-04-21 VITALS — BP 126/74 | HR 67 | Temp 97.9°F | Ht 71.0 in | Wt 228.0 lb

## 2015-04-21 DIAGNOSIS — M79605 Pain in left leg: Secondary | ICD-10-CM

## 2015-04-21 MED ORDER — CYCLOBENZAPRINE HCL 5 MG PO TABS: 5.0000 mg | ORAL_TABLET | Freq: Three times a day (TID) | ORAL | Status: DC | PRN

## 2015-04-21 MED ORDER — IBUPROFEN 600 MG PO TABS: 600.0000 mg | ORAL_TABLET | Freq: Three times a day (TID) | ORAL | Status: DC | PRN

## 2015-04-21 NOTE — Patient Instructions (Signed)
Take ibuprofen 600mg  every 8 hours as needed Sent in muscle relaxer & ibuprofen Use caution as it might make you sleepy  You can carefully try heating pad in that area - be careful not to burn anything  Follow up if not better in 1-2 weeks  Be well, Dr. Ardelia Mems

## 2015-04-21 NOTE — Progress Notes (Signed)
Date of Visit: 04/21/2015   HPI:  Patient presents for a same day appointment to discuss leg/groin pain.  Has had pain on inside of his leg for 1 week. Notices pain when he moves leg, especially with abduction of L leg. No fevers, injuries, rashes, problems urinating, or issues with penis, testicles, or scrotum. Especially notes the pain at night whenever he rolls over, feels the pain in the muscle of his leg. Eating and drinking well. Stooling and urinating normally.    ROS: See HPI  [place]: history of bipolar disorder, GERD  PHYSICAL EXAM: BP 126/74 mmHg  Pulse 67  Temp(Src) 97.9 F (36.6 C) (Oral)  Ht 5\' 11"  (1.803 m)  Wt 228 lb (103.42 kg)  BMI 31.81 kg/m2 Gen: NAD, pleasant, cooperative musculoskeletal : mild tenderness to inner left thigh near pelvis, along musculature. Full strength bilateral lower extremities  GU: no inguinal hernias appreciated, including with valsava. Scrotum normal in size.   ASSESSMENT/PLAN:  1. Leg/groin pain - suspect pulled muscle. No signs of hernia or genital pathology on exam. Recommend gentle heat (careful due to location near genitals), ibuprofen, flexeril. Follow up if not better in 1-2 weeks.   FOLLOW UP: Follow up as needed if symptoms worsen or fail to improve.    [place]. Ardelia Mems, [place]

## 2015-04-23 ENCOUNTER — Ambulatory Visit (INDEPENDENT_AMBULATORY_CARE_PROVIDER_SITE_OTHER): Payer: Medicaid Other | Admitting: Allergy and Immunology

## 2015-04-23 ENCOUNTER — Encounter: Payer: Self-pay | Admitting: Allergy and Immunology

## 2015-04-23 VITALS — BP 130/78 | HR 78 | Temp 97.4°F | Resp 16 | Ht 70.98 in | Wt 232.8 lb

## 2015-04-23 DIAGNOSIS — J4520 Mild intermittent asthma, uncomplicated: Secondary | ICD-10-CM

## 2015-04-23 DIAGNOSIS — R21 Rash and other nonspecific skin eruption: Secondary | ICD-10-CM

## 2015-04-23 MED ORDER — EPINEPHRINE 0.3 MG/0.3ML IJ SOAJ: 0.3000 mg | Freq: Once | INTRAMUSCULAR | Status: DC

## 2015-04-23 MED ORDER — FLUTICASONE PROPIONATE 50 MCG/ACT NA SUSP: NASAL | Status: DC

## 2015-04-23 MED ORDER — AZELASTINE HCL 0.15 % NA SOLN: NASAL | Status: DC

## 2015-04-23 MED ORDER — CETIRIZINE HCL 10 MG PO CHEW: 10.0000 mg | CHEWABLE_TABLET | Freq: Every day | ORAL | Status: DC

## 2015-04-23 MED ORDER — OLOPATADINE HCL 0.7 % OP SOLN: 1.0000 [drp] | Freq: Every day | OPHTHALMIC | Status: DC

## 2015-04-23 MED ORDER — BECLOMETHASONE DIPROPIONATE 80 MCG/ACT IN AERS: 2.0000 | INHALATION_SPRAY | Freq: Every day | RESPIRATORY_TRACT | Status: DC

## 2015-04-23 NOTE — Progress Notes (Signed)
FOLLOW UP NOTE  RE: Stephen Stephen [name] MRN: [id number] DOB: 04-25-1990 [facility]  [address] [address] Date of Office Visit: 04/23/2015  Subjective:  Stephen Stephen [name] is a 25 y.o. male who presents today for Asthma; Eyes Itch; Medication Management; and Food Allergy  Assessment:   1. Mild intermittent asthma.  2. Allergic rhinoconjunctivitis, now in greater symptomatic season   3.      History of nut allergy--- avoidance and emergency action plan in place. Plan:   Meds ordered this encounter  Medications  . beclomethasone (QVAR) 80 MCG/ACT inhaler    Sig: Inhale 2 puffs into the lungs daily. 2 puffs each morning, rinse mouth after use    Dispense:  1 Inhaler    Refill:  2  . fluticasone (FLONASE) 50 MCG/ACT nasal spray    Sig: 1-2 sprays each nostril each morning    Dispense:  16 g    Refill:  2  . Azelastine HCl 0.15 % SOLN    Sig: 1 to 2 sprays each nostril each evening    Dispense:  30 mL    Refill:  2  . Olopatadine HCl (PAZEO) 0.7 % SOLN    Sig: Apply 1 drop to eye daily. As needed    Dispense:  1 Bottle    Refill:  2  . EPINEPHrine (EPIPEN 2-PAK) 0.3 mg/0.3 mL IJ SOAJ injection    Sig: Inject 0.3 mLs (0.3 mg total) into the muscle once.    Dispense:  1 Device    Refill:  2  . cetirizine (ZYRTEC) 10 MG chewable tablet    Sig: Chew 1 tablet (10 mg total) by mouth daily.    Dispense:  30 tablet    Refill:  5   Patient Instructions  1.  Use QVAR 27mcg 2 puffs each morning---rinse mouth after use. 2.  Zyrtec 10mg  once daily. 3.  Flonase 1-2 sprays each nostril each morning. 4.  Astepro 1-2 sprays each nostril each evening. 5.  Pazeo one drop once daily as needed. 6.  Epi-pen/benadryl as needed--- continue with food avoidance as previously. 7.  Selected labs at [facility]---specific IgE for selected foods. 8.  Remain smoke free.   9.  Stephen [name] in 6 months or sooner if needed.  HPI: Stephen Stephen [name] to the  office in Stephen [name] of allergic rhinoconjunctivitis and food allergy, though Stephen Stephen [name] has not been seen in our office since March 2015.  Stephen Stephen [name] reports needing refill of all medications as spring is usually a greater symptomatic season for him and Stephen Stephen [name] was concerned about the recent fluctuant weather patterns with slight nasal congestion.  This is also the time year when cough or chest congestion have been prominent, though has not currently.  Stephen Stephen [name] reports discontinuing smoking in February 2017, and no other new medical concerns.  Stephen Stephen [name] is exercising without issue and only recalls albuterol use once or twice in a year.  Stephen Stephen [name] only remembers about age 72 a local swelling, red, pruritic area after a bee sting without generalized symptoms and no further stings.  Stephen Stephen [name] avoids nuts without difficulty, however, reports eating various soy-containing foods without difficulty.  Denies ED or urgent care visits, prednisone or antibiotic courses.  Reports sleep and activity are normal.  Currently working at [employer] and planning to return to [employer], possibly for [employer] degree.  Stephen Stephen [name] has a current medication list which includes the following prescription(s): albuterol, aspirin-acetaminophen-caffeine, cyclobenzaprine, epinephrine, ibuprofen, mupirocin ointment,.  Drug Allergies: Allergies  Allergen Reactions  . Soy Allergy     Per the pt Stephen Stephen [name] eats soy products without sx.  Stephen Stephen [name] said Stephen Stephen [name] tested positive to soy on an allergy test.   Objective:   Filed Vitals:   04/23/15 1342  BP: 130/78  Pulse: 78  Temp: 97.4 F (36.3 C)  Resp: 16   SpO2 Readings from Last 1 Encounters:  04/23/15 97%   Physical Exam  Constitutional: Stephen Stephen [name] is well-developed, well-nourished, and in no distress.  HENT:  Head: Atraumatic.  Right Ear: Tympanic membrane and ear canal normal.  Left Ear: Tympanic membrane and ear canal normal.  Nose: Mucosal edema present. No rhinorrhea. No epistaxis.  Mouth/Throat: Oropharynx is clear and moist and mucous  membranes are normal. No oropharyngeal exudate, posterior oropharyngeal edema or posterior oropharyngeal erythema.  Eyes: Conjunctivae are normal.  Neck: Neck supple.  Cardiovascular: Normal rate, S1 normal and S2 normal.   No murmur heard. Pulmonary/Chest: Effort normal and breath sounds normal. Stephen Stephen [name] has no wheezes. Stephen Stephen [name] has no rhonchi. Stephen Stephen [name] has no rales.  Lymphadenopathy:    Stephen Stephen [name] has no cervical adenopathy.  Skin: Skin is warm and intact. No rash noted. No cyanosis. Nails show no clubbing.   Diagnostics: Spirometry:  FVC 4.59--100%, FEV1 4.03--102%.    Stephen Stephen [name] M. Ishmael Holter, MD  cc: Clearance Coots, MD

## 2015-04-23 NOTE — Patient Instructions (Signed)
   Use QVAR 2 puffs each morning---rinse mouth after use.  Zyrtec 10mg  once daily.  Flonase 1-2 sprays each nostril each morning.  Astepro 1-2 sprays each nostril each evening.  Pazeo one drop once daily as needed.  Epi-pen/benadryl as needed.  Selected labs at [facility].  Follow-up in 6 months or sooner if needed.

## 2015-04-24 MED ORDER — CETIRIZINE HCL 10 MG PO CHEW: 10.0000 mg | CHEWABLE_TABLET | Freq: Every day | ORAL | Status: DC

## 2015-05-07 ENCOUNTER — Ambulatory Visit (INDEPENDENT_AMBULATORY_CARE_PROVIDER_SITE_OTHER): Payer: Medicaid Other | Admitting: Family Medicine

## 2015-05-07 ENCOUNTER — Encounter: Payer: Self-pay | Admitting: Family Medicine

## 2015-05-07 VITALS — BP 142/73 | HR 75 | Temp 98.3°F | Ht 71.0 in | Wt 233.3 lb

## 2015-05-07 DIAGNOSIS — R238 Other skin changes: Secondary | ICD-10-CM | POA: Diagnosis present

## 2015-05-07 NOTE — Patient Instructions (Signed)
Thank you for coming in,   I would use the bactroban on your face.   You can try putting the steroid cream on the spots on your hands to see what happens.   Please bring all of your medications with you to each visit.   Sign up for My Chart to have easy access to your labs results, and communication with your Primary care physician   Please feel free to call with any questions or concerns at any time, at [phone]. --Dr. Raeford Razor

## 2015-05-07 NOTE — Progress Notes (Signed)
   Subjective:    Stephen Frank - 25 y.o. male MRN [id number]  Date of birth: 1990-09-22  CC vesicles   HPI  Stephen Frank is here for vesicles.  Vesicles:  He has been seen by the dermatologist He is concerned about the vesicles that are occurring on his face and extremities. He was told that it was impetigo and is been placing Bactroban on them with limited improvement. When they appear then he will try to pop them and then they become crusty in nature. He denies any fevers or chills. He is using the Bactroban 2-3 times per day. He also uses a microbacterial facial wash. Has been seen by his allergist who didn't appreciate that the vesicles were associated with an allergy. He has not tried placing any topical steroids on the sites.  SH: Stop smoking cigarettes. PMH: GERD, asthma   Health Maintenance:  Health Maintenance Due  Topic Date Due  . PNEUMOCOCCAL POLYSACCHARIDE VACCINE (1) 05/03/1992  . FOOT EXAM  05/03/2000  . OPHTHALMOLOGY EXAM  05/03/2000  . URINE MICROALBUMIN  05/03/2000  . HEMOGLOBIN A1C  10/23/2013    Review of Systems See HPI     Objective:   Physical Exam BP 142/73 mmHg  Pulse 75  Temp(Src) 98.3 F (36.8 C) (Oral)  Ht 5\' 11"  (1.803 m)  Wt 233 lb 4.8 oz (105.824 kg)  BMI 32.55 kg/m2 Gen: NAD, alert, cooperative with exam,  Skin: small 60mm vesicles occurring sporadically on his face, his left first digit and his left upper extremity    Assessment & Plan:   Vesicles Possible for dyshidrotic eczema versus impetigo He has follow-up with dermatology later this month. It is not related to dermatomal distribution - Advise that he continue using the Bactroban on his face  - He can try using topical steroids on his extremities were they occur

## 2015-05-07 NOTE — Assessment & Plan Note (Signed)
Possible for dyshidrotic eczema versus impetigo He has follow-up with dermatology later this month. It is not related to dermatomal distribution - Advise that he continue using the Bactroban on his face  - He can try using topical steroids on his extremities were they occur

## 2015-07-06 ENCOUNTER — Other Ambulatory Visit: Payer: Self-pay | Admitting: Family Medicine

## 2015-09-01 ENCOUNTER — Encounter: Payer: Self-pay | Admitting: Family Medicine

## 2015-09-01 ENCOUNTER — Ambulatory Visit (INDEPENDENT_AMBULATORY_CARE_PROVIDER_SITE_OTHER): Payer: Medicaid Other | Admitting: Family Medicine

## 2015-09-01 ENCOUNTER — Other Ambulatory Visit (HOSPITAL_COMMUNITY)
Admission: RE | Admit: 2015-09-01 | Discharge: 2015-09-01 | Disposition: A | Discharge status: Home / Self Care | Payer: Medicaid Other | Source: Ambulatory Visit | Attending: Family Medicine | Admitting: Family Medicine

## 2015-09-01 VITALS — BP 122/68 | HR 76 | Temp 98.3°F | Ht 71.0 in | Wt 230.2 lb

## 2015-09-01 DIAGNOSIS — Z7251 High risk heterosexual behavior: Secondary | ICD-10-CM | POA: Diagnosis not present

## 2015-09-01 DIAGNOSIS — K5909 Other constipation: Secondary | ICD-10-CM

## 2015-09-01 DIAGNOSIS — Z0000 Encounter for general adult medical examination without abnormal findings: Secondary | ICD-10-CM | POA: Diagnosis not present

## 2015-09-01 DIAGNOSIS — Z113 Encounter for screening for infections with a predominantly sexual mode of transmission: Secondary | ICD-10-CM | POA: Insufficient documentation

## 2015-09-01 MED ORDER — SENNA 8.6 MG PO TABS: 1.0000 | ORAL_TABLET | Freq: Every day | ORAL | 1 refills | Status: DC

## 2015-09-01 NOTE — Progress Notes (Signed)
Subjective:     Patient ID: Stephen Frank, male   DOB: [birth year], 25 y.o.   MRN: [id number]  HPI Stephen Frank is a 25yo male presenting today for general exam. -Medications reviewed and updated in Epic -Allergies also reviewed and updated. Notes allergy to bee stings which was not noted. -Reports problems with hemorrhoids for the last 2 weeks. Has been using Preparation H as well as Epsom salt with some improvement in pain. Reports history of constipation which he believes is contributing. Requests medication for constipation. Denies pain at this time. Denies blood in stool. -Requests STD screening. Denies symptoms including penile discharge or genital ulcers. Chart review shows negative gonorrhea and Chlamydia in June 2016 as well as negative HIV and RPR in March 2015. Ports he is sexually active with last intercourse Saturday, August 5. Uses protection. -Denies any other acute problems at this time  Review of Systems Per HPI. Other systems negative.    Objective:   Physical Exam General: 25 y.o. male resting comfortably in no apparent distress Cardiac: S1 and S2 noted. No murmurs, rubs, or gallops. Regular rate and rhythm. Pulm: Clear to auscultation bilaterally. No wheezes/rales/rhonchi. No increased work of breathing noted. Abd: Bowel sounds noted. Soft and nondistended. No tenderness or masses to palpation. Extremities: No edema noted. Pulses palpated. Neuro: No focal deficits. AAOx3. Skin: No rashes noted. Hyperpigmented papules noted on left back. Warm and dry.    Assessment and Plan:     1. High risk sexual behavior -Will check urine gonorrhea and chlamydia, HIV and RPR -Counseled on safe sex practices -Follow-up in 1 year or sooner if needed  2. Constipation -Prescription for senna sent to pharmacy -Contact office if symptoms of hemorrhoids worsen

## 2015-09-01 NOTE — Patient Instructions (Signed)
Thank you so much for coming to visit [place] today! We will check several labs today. We will let you know the results. Please follow up with dermatology. Please follow up in one year or sooner if needed.  Dr. Gerlean Ren

## 2015-09-02 LAB — RPR

## 2015-09-02 LAB — HIV ANTIBODY (ROUTINE TESTING W REFLEX): HIV: NONREACTIVE

## 2015-09-03 LAB — URINE CYTOLOGY ANCILLARY ONLY
Chlamydia: NEGATIVE
Neisseria Gonorrhea: NEGATIVE

## 2015-09-29 ENCOUNTER — Telehealth: Payer: Self-pay | Admitting: Family Medicine

## 2015-09-29 ENCOUNTER — Encounter: Payer: Self-pay | Admitting: Student

## 2015-09-29 ENCOUNTER — Ambulatory Visit (INDEPENDENT_AMBULATORY_CARE_PROVIDER_SITE_OTHER): Payer: Medicaid Other | Admitting: Student

## 2015-09-29 ENCOUNTER — Other Ambulatory Visit (HOSPITAL_COMMUNITY)
Admission: RE | Admit: 2015-09-29 | Discharge: 2015-09-29 | Disposition: A | Discharge status: Home / Self Care | Payer: Medicaid Other | Source: Ambulatory Visit | Attending: Family Medicine | Admitting: Family Medicine

## 2015-09-29 VITALS — BP 146/85 | HR 66 | Temp 98.6°F | Wt 230.0 lb

## 2015-09-29 DIAGNOSIS — T148 Other injury of unspecified body region: Secondary | ICD-10-CM

## 2015-09-29 DIAGNOSIS — L299 Pruritus, unspecified: Secondary | ICD-10-CM

## 2015-09-29 DIAGNOSIS — S30861A Insect bite (nonvenomous) of abdominal wall, initial encounter: Secondary | ICD-10-CM

## 2015-09-29 DIAGNOSIS — W57XXXA Bitten or stung by nonvenomous insect and other nonvenomous arthropods, initial encounter: Secondary | ICD-10-CM

## 2015-09-29 DIAGNOSIS — Z7251 High risk heterosexual behavior: Secondary | ICD-10-CM

## 2015-09-29 DIAGNOSIS — R21 Rash and other nonspecific skin eruption: Secondary | ICD-10-CM

## 2015-09-29 DIAGNOSIS — Z113 Encounter for screening for infections with a predominantly sexual mode of transmission: Secondary | ICD-10-CM | POA: Diagnosis not present

## 2015-09-29 MED ORDER — HYDROXYZINE HCL 25 MG PO TABS: 25.0000 mg | ORAL_TABLET | Freq: Three times a day (TID) | ORAL | 0 refills | Status: DC | PRN

## 2015-09-29 MED ORDER — DOXYCYCLINE HYCLATE 100 MG PO TABS: 100.0000 mg | ORAL_TABLET | Freq: Two times a day (BID) | ORAL | 0 refills | Status: DC

## 2015-09-29 NOTE — Progress Notes (Signed)
   Subjective:    Patient ID: Stephen Frank is a 25 y.o. old male.  HPI #Burning and itching in hands for the last two days: reports skin rash on his left wrist, palm and abdomen. Rash is pruritic. The rash and itching are getting worse.  Reports mosquito bites while having cookout in the yard and helping with yard work past weekend (two days ago).  He states that he was started on "Prazocin for nightmare" by Dr. Margarito Liner at [facility] that he started taking two days ago. Denies new soap or detergent. He also reports two tick bites on his abdomen about 10 days ago . Denies recent sickness. He also reports some skin bumps on his palms that are pruritic.  He is sexually active with five male partners in the last 6 months. Use condoms except with one of them. Reports oral sex as well. Currently has one sexual partner.   Denies dysuria, penile discharge, swelling in scrotum, fever, chills or joint pain.  PMH: reviewed SH: smokes marijuana but not tobacco. Drinks occassionally  Review of Systems Per HPI Objective:   Vitals:   09/29/15 0933  BP: (!) 146/85  Pulse: 66  Temp: 98.6 F (37 C)  TempSrc: Oral  SpO2: 96%  Weight: 230 lb (104.3 kg)    GEN: appears anxious, no apparent distress. HEENT:  Oropharynx: mmm without erythema or exudation CVS: RRR, normal s1 and s2, no murmurs, no edema RESP: no increased work of breathing GI: soft, non-tender,non-distended SKIN: erythematous patches on his left wrist, few papules on over his palmar aspect of his fingers, clearly defined circular papules over his periumbilical area, one distinct circular erythematous lesion with central hole surrounded by hyperpigmented skin (tick bite per patient). (see pic for more)       HEM: no cervical LAD NEURO: alert and oriented appropriately, no gross defecits  PSYCH: appropriate mood and affect       Assessment & Plan:  Rash and nonspecific skin eruption Unclear etiology. Could be mosquito bite  given history for recent outdoor activity and acute onset. However, distribution on abdomen not typical. Doubt this is related to his prazocin. Lesion on his fingers concerns me for syphilis given risky sexual behavior. Patient has bipolar disorder. Contact dermatitis is another possibility. -Atarax 25 mg three times a day for pruritis -GC/CT/Trich, HIV and RPR. His recent tests were negative -Return to clinic in worsening or no improvement over the next couple of weeks. Could consider biopsy.   Tick bite of abdomen No systemic symptoms at the moment. -Doxycycline 100 mg twice a day for 10 days for prophylaxis against lyme disease.

## 2015-09-29 NOTE — Telephone Encounter (Signed)
Can call a professional pest control service to eradicate if he finds one. Usually, they are hard to eradicate as they could hide in mattress and cracks of bed.   No additional intervention for his rash.

## 2015-09-29 NOTE — Assessment & Plan Note (Signed)
No systemic symptoms at the moment. -Doxycycline 100 mg twice a day for 10 days for prophylaxis against lyme disease.

## 2015-09-29 NOTE — Telephone Encounter (Signed)
Pt called and said that after he left the office he goggled about his bumps and they match up to the pictures online for bed bug bites. He wanted to know does he need to do anything different about this. jw

## 2015-09-29 NOTE — Patient Instructions (Addendum)
It was great seeing you today! We have addressed the following issues today  1. Skin rash: This could be mosquito bites. I have sent a prescription for hydroxyzine to your pharmacy for itching. Have also ordered some tests to exclude infection. You will get a call if any of the test results are positive. Feel free to come back and see [place] if your rash get worse or if you have symptoms concerning to you.  2.   Tick bite: Have sent a prescription for doxycycline to your pharmacy for Lyme disease prophylaxis. Please take this medication until it is gone.     If we did any lab work today, and the results require attention, either me or my nurse will get in touch with you. If everything is normal, you will get a letter in mail. If you don't hear from [place] in two weeks, please give [place] a call. Otherwise, I look forward to talking with you again at our next visit. If you have any questions or concerns before then, please call the clinic at [phone].  Please bring all your medications to every doctors visit   Sign up for My Chart to have easy access to your labs results, and communication with your Primary care physician.    Please check-out at the front desk before leaving the clinic.   Take Care,

## 2015-09-29 NOTE — Assessment & Plan Note (Signed)
Unclear etiology. Could be mosquito bite given history for recent outdoor activity and acute onset. However, distribution on abdomen not typical. Doubt this is related to his prazocin. Lesion on his fingers concerns me for syphilis given risky sexual behavior. Patient has bipolar disorder. Contact dermatitis is another possibility. -Atarax 25 mg three times a day for pruritis -GC/CT/Trich, HIV and RPR. His recent tests were negative -Return to clinic in worsening or no improvement over the next couple of weeks. Could consider biopsy.

## 2015-09-30 ENCOUNTER — Encounter: Payer: Self-pay | Admitting: Student

## 2015-09-30 ENCOUNTER — Ambulatory Visit: Payer: Medicaid Other | Admitting: Family Medicine

## 2015-09-30 LAB — RPR

## 2015-09-30 LAB — URINE CYTOLOGY ANCILLARY ONLY
Chlamydia: NEGATIVE
Neisseria Gonorrhea: NEGATIVE
Trichomonas: NEGATIVE

## 2015-09-30 LAB — HIV ANTIBODY (ROUTINE TESTING W REFLEX): HIV 1&2 Ab, 4th Generation: NONREACTIVE

## 2015-10-01 ENCOUNTER — Encounter: Payer: Self-pay | Admitting: Student

## 2015-10-03 ENCOUNTER — Other Ambulatory Visit: Payer: Self-pay | Admitting: Internal Medicine

## 2015-10-03 DIAGNOSIS — R1013 Epigastric pain: Secondary | ICD-10-CM

## 2015-10-03 DIAGNOSIS — K2100 Gastro-esophageal reflux disease with esophagitis, without bleeding: Secondary | ICD-10-CM

## 2015-10-23 ENCOUNTER — Ambulatory Visit: Payer: Medicaid Other | Admitting: Family Medicine

## 2015-10-23 ENCOUNTER — Ambulatory Visit: Payer: Medicaid Other | Admitting: Allergy

## 2015-10-29 ENCOUNTER — Ambulatory Visit (INDEPENDENT_AMBULATORY_CARE_PROVIDER_SITE_OTHER): Payer: Medicaid Other | Admitting: Internal Medicine

## 2015-10-29 VITALS — BP 136/90 | HR 97 | Temp 100.3°F | Wt 223.0 lb

## 2015-10-29 DIAGNOSIS — M791 Myalgia, unspecified site: Secondary | ICD-10-CM

## 2015-10-29 DIAGNOSIS — R509 Fever, unspecified: Secondary | ICD-10-CM | POA: Diagnosis not present

## 2015-10-29 DIAGNOSIS — R6889 Other general symptoms and signs: Secondary | ICD-10-CM | POA: Diagnosis present

## 2015-10-29 DIAGNOSIS — R519 Headache, unspecified: Secondary | ICD-10-CM

## 2015-10-29 DIAGNOSIS — R51 Headache: Secondary | ICD-10-CM | POA: Diagnosis not present

## 2015-10-29 MED ORDER — CYCLOBENZAPRINE HCL 5 MG PO TABS: 5.0000 mg | ORAL_TABLET | Freq: Two times a day (BID) | ORAL | 0 refills | Status: DC | PRN

## 2015-10-29 MED ORDER — KETOROLAC TROMETHAMINE 30 MG/ML IJ SOLN
30.0000 mg | Freq: Once | INTRAMUSCULAR | Status: AC
  Administered 2015-10-29: 30 mg via INTRAMUSCULAR

## 2015-10-29 MED ORDER — KETOROLAC TROMETHAMINE 30 MG/ML IJ SOLN: 30.0000 mg | Freq: Once | INTRAMUSCULAR | Status: DC

## 2015-10-29 MED ORDER — OSELTAMIVIR PHOSPHATE 75 MG PO CAPS: 75.0000 mg | ORAL_CAPSULE | Freq: Two times a day (BID) | ORAL | 0 refills | Status: AC

## 2015-10-29 NOTE — Progress Notes (Signed)
[facility] Progress Note  Subjective:  Stephen Frank is a 25 y.o. male who presents for SDA with 3 days of fever and chills. Associated symptoms include myalgias, HA, neck pain, sore throat (now resolved, cough, and occasional dizziness. Symptoms began with diaphoresis and chills. He took nyquil once this morning, which helped a little. He said neck pain and myalgias began after playing basketball. He says he can feel his heart in his head when he bends over. He is concerned he could have flu. He has been told he had high blood pressure in the past and took norvasc a few times and is also concerned his headache could be due to high BP. He had meningitis vaccine in 2010, per [facility]. No known sick contacts.  ROS: No n/v/d, no vision changes  Social: Former smoker  Objective: Blood pressure 136/90, pulse 97, temperature 100.3 F (37.9 C), temperature source Oral, weight 223 lb (101.2 kg). Constitutional: Tired-appearing male, wearing puffer coat, in NAD HENT: Normal posterior oropharynx. Could not visualize TMs due to cerumen. Minimal nasal congestion.  Eyes: PERRLA, EOMI Cardiovascular: RRR, S1, S2, no m/r/g.  Pulmonary/Chest: Effort normal and breath sounds normal. No respiratory distress.  Abdominal: Soft. +BS, soft, NT, ND, no rebound or guarding.  Musculoskeletal: Neck with FROM but reports some discomfort with full flexion and lateral rotation. No midline spinal tenderness to palpation. Negative Kernig's sign.  Neurological: AOx3, no focal deficits. Skin: Diaphoretic. No rashes noted.  Psychiatric: Normal mood and affect.  Vitals reviewed  Assessment/Plan: Fever - Suspect 2/2 viral respiratory infection. Exam negative for meningismus. Cannot r/o flu, and due to myalgias and fevers, will prescribe tamiflu.  - Recommended tylenol and ibuprofen for fevers. - Gave 30 mg IM toradol shot in office for HA  Follow-up as needed. Told to go to Emergency Room if develops  intractable vomiting or headache.   Olene Floss, MD [place], PGY-2

## 2015-10-29 NOTE — Patient Instructions (Signed)
Mr. Steimer,  Thank you for coming in today.  Please take tamiflu twice daily for 5 days to reduce duration of flu if you possibly have the virus. Take tylenol or ibuprofen for fevers.  If you have worsening headaches or can't stop throwing up, please go to the ED.  Please make an appointment once you are feeling better at your earliest convenience to discuss blood pressure.  Best, Dr. Ola Spurr

## 2015-11-01 ENCOUNTER — Encounter: Payer: Self-pay | Admitting: Internal Medicine

## 2015-11-01 DIAGNOSIS — R509 Fever, unspecified: Secondary | ICD-10-CM | POA: Insufficient documentation

## 2015-11-01 NOTE — Assessment & Plan Note (Signed)
-   Suspect 2/2 viral respiratory infection. Exam negative for meningismus. Cannot r/o flu, and due to myalgias and fevers, will prescribe tamiflu.  - Recommended tylenol and ibuprofen for fevers. - Gave 30 mg IM toradol shot in office for HA

## 2015-12-16 ENCOUNTER — Other Ambulatory Visit: Payer: Self-pay | Admitting: Internal Medicine

## 2015-12-16 DIAGNOSIS — K2100 Gastro-esophageal reflux disease with esophagitis, without bleeding: Secondary | ICD-10-CM

## 2015-12-16 DIAGNOSIS — R1013 Epigastric pain: Secondary | ICD-10-CM

## 2016-01-01 ENCOUNTER — Ambulatory Visit: Payer: Medicaid Other | Admitting: Family Medicine

## 2016-01-04 ENCOUNTER — Ambulatory Visit: Payer: Medicaid Other | Admitting: Family Medicine

## 2016-01-08 ENCOUNTER — Ambulatory Visit (INDEPENDENT_AMBULATORY_CARE_PROVIDER_SITE_OTHER): Payer: Medicaid Other | Admitting: Family Medicine

## 2016-01-08 ENCOUNTER — Encounter: Payer: Self-pay | Admitting: Family Medicine

## 2016-01-08 VITALS — BP 118/84 | HR 70 | Temp 98.0°F | Ht 71.0 in | Wt 230.4 lb

## 2016-01-08 DIAGNOSIS — M7052 Other bursitis of knee, left knee: Secondary | ICD-10-CM | POA: Diagnosis present

## 2016-01-08 MED ORDER — DICLOFENAC SODIUM 75 MG PO TBEC: 75.0000 mg | DELAYED_RELEASE_TABLET | Freq: Two times a day (BID) | ORAL | 0 refills | Status: DC

## 2016-01-08 NOTE — Progress Notes (Signed)
   Subjective: CC: left knee pain [id number] D Spada is a 25 y.o. male presenting to clinic today for same day appointment. PCP: Carlyle Dolly, MD Concerns today include:  1. Left knee Patient reports that pain started a couple of months ago.  He notes that pain seems to be getting worse.  He reports pain seems to be inside of the knee and radiates to the posterior aspect.  He reports pain with bending down on knee and hyperflexion of knee.  He denies injury to knee.  Denies locking, popping, ecchymosis, swelling, erythema.  He has not done anything to help with pain.    Allergies  Allergen Reactions  . Bee Venom   . Soy Allergy     Per the pt he eats soy products without sx.  He said he tested positive to soy on an allergy test.   Social History Reviewed: active smoker. FamHx and MedHx reviewed.  Please see EMR. PSurg Hx: no surgery or fracture of LLE  Health Maintenance:  Flu Vaccine needed: yes   ROS: Per HPI  Objective: Office vital signs reviewed. BP 118/84   Pulse 70   Temp 98 F (36.7 C) (Oral)   Ht 5\' 11"  (1.803 m)   Wt 230 lb 6.4 oz (104.5 kg)   SpO2 97%   BMI 32.13 kg/m   Physical Examination:  General: Awake, alert, well nourished, No acute distress MSK: normal gait and normal station, full painless AROM on the right.  Left knee with pain in hyperflexion of knee.  Otherwise normal AROM.  No TTP patella, no joint line TTP, negative drawer testing, no ligamentous laxity. Slight pain with Thessaly on the medial aspect of the left knee. Skin: no erythema or ecchymosis of knee. No effusion. Neuro:  LE strength and light touch sensation in tact.  Assessment/ Plan: 25 y.o. male   1. Bursitis of left knee, unspecified bursa.  Given history and lack of physical exam findings, this is what I suspect is most likely the problem.  He did have slight pain with Thessaly on the medial aspect of the knee.  Though no other signs of meniscal injury.  Additionally, no  evidence of bursa infection at this time. - Home care instructions reviewed - diclofenac (VOLTAREN) 75 MG EC tablet; Take 1 tablet (75 mg total) by mouth 2 (two) times daily.  Dispense: 30 tablet; Refill: 0 - Advised against use of other NSAIDs while taking Voltaren. - Return precautions reviewed - Referral to sports medicine for ultrasound of knee - Follow up prn w/ pcp  Janora Norlander, DO PGY-3, [facility]

## 2016-01-08 NOTE — Patient Instructions (Signed)
If your pain persists despite medication, I recommend that you schedule an appointment with [facility] at : [phone] for further evaluation.  Bursitis Introduction Bursitis is when the fluid-filled sac (bursa) that covers and protects a joint is swollen (inflamed). Bursitis is most common near joints, especially the knees, elbows, hips, and shoulders. Follow these instructions at home:  Take medicines only as told by your doctor.  If you were prescribed an antibiotic medicine, finish it all even if you start to feel better.  Rest the affected area as told by your doctor.  Keep the area raised up.  Avoid doing things that make the pain worse.  Apply ice to the injured area:  Place ice in a plastic bag.  Place a towel between your skin and the bag.  Leave the ice on for 20 minutes, 2-3 times a day.  Use splints, braces, pads, or walking aids as told by your doctor.  Keep all follow-up visits as told by your doctor. This is important. Contact a doctor if:  You have more pain with home care.  You have a fever.  You have chills. This information is not intended to replace advice given to you by your health care provider. Make sure you discuss any questions you have with your health care provider. Document Released: 06/30/2009 Document Revised: 06/18/2015 Document Reviewed: 04/01/2013  2017 Elsevier

## 2016-01-11 ENCOUNTER — Encounter: Payer: Self-pay | Admitting: Family Medicine

## 2016-01-11 ENCOUNTER — Ambulatory Visit (HOSPITAL_BASED_OUTPATIENT_CLINIC_OR_DEPARTMENT_OTHER)
Admission: RE | Admit: 2016-01-11 | Discharge: 2016-01-11 | Disposition: A | Discharge status: Home / Self Care | Payer: Medicaid Other | Source: Ambulatory Visit | Attending: Family Medicine | Admitting: Family Medicine

## 2016-01-11 ENCOUNTER — Ambulatory Visit (INDEPENDENT_AMBULATORY_CARE_PROVIDER_SITE_OTHER): Payer: Medicaid Other | Admitting: Family Medicine

## 2016-01-11 VITALS — BP 121/81 | HR 72 | Ht 71.0 in | Wt 220.0 lb

## 2016-01-11 DIAGNOSIS — M25562 Pain in left knee: Secondary | ICD-10-CM

## 2016-01-11 NOTE — Patient Instructions (Addendum)
Your x-rays and ultrasound are reassuring. This is consistent with a quad tendon strain/tendinitis with secondary hamstring strain and pes bursitis Ice the knee 15 minutes at a time 3-4 times a day. Diclofenac twice a day with food as prescribed by your primary care physician. Sleeve for support when up and walking around. The exercises are very important - straight leg raises, knee extensions, hamstring curls, hamstring swings, decline half-squats - 3 sets of 10 once a day. Add ankle weight if these become too easy. Follow up with me in 6 weeks. If not improving would consider MRI.

## 2016-01-12 DIAGNOSIS — M25562 Pain in left knee: Secondary | ICD-10-CM | POA: Insufficient documentation

## 2016-01-12 NOTE — Progress Notes (Signed)
PCP: Carlyle Dolly, MD  Subjective:   HPI: Patient is a 25 y.o. male here for left knee pain.  Patient reports he's had about 1-2 months of anterior knee pain. No known injury or trauma. Difficulty with bending the knee - feels pain in quad tendon area with this. Questionable swelling. Tried OTC nsaids. Pain is up to 9/10, sharp. Gets some pain in calf also. No skin changes, numbness.  Past Medical History:  Diagnosis Date  . ADHD (attention deficit hyperactivity disorder)   . Allergy   . Asthma   . Bipolar disorder ([place])   . GERD (gastroesophageal reflux disease) 04/25/2012  . H/O chlamydia infection   . History of seasonal allergies     Current Outpatient Prescriptions on File Prior to Visit  Medication Sig Dispense Refill  . albuterol (PROAIR HFA) 108 (90 Base) MCG/ACT inhaler Inhale 2 puffs into the lungs every 6 (six) hours as needed for wheezing or shortness of breath.    Marland Kitchen aspirin-acetaminophen-caffeine (EXCEDRIN MIGRAINE) 250-250-65 MG per tablet Take 2 tablets by mouth every 6 (six) hours as needed for headache.    . beclomethasone (QVAR) 80 MCG/ACT inhaler Inhale 2 puffs into the lungs daily. 2 puffs each morning, rinse mouth after use 1 Inhaler 2  . cetirizine (ZYRTEC) 10 MG chewable tablet Chew 1 tablet (10 mg total) by mouth daily. 30 tablet 5  . diclofenac (VOLTAREN) 75 MG EC tablet Take 1 tablet (75 mg total) by mouth 2 (two) times daily. 30 tablet 0  . EPINEPHrine (EPIPEN 2-PAK) 0.3 mg/0.3 mL IJ SOAJ injection Inject 0.3 mLs (0.3 mg total) into the muscle once. 1 Device 2  . hydrOXYzine (ATARAX/VISTARIL) 25 MG tablet Take 1 tablet (25 mg total) by mouth 3 (three) times daily as needed. 30 tablet 0  . pantoprazole (PROTONIX) 40 MG tablet TAKE 1 TABLET (40 MG TOTAL) BY MOUTH DAILY BEFORE BREAKFAST. 30 tablet 0  . senna (SENOKOT) 8.6 MG TABS tablet Take 1 tablet (8.6 mg total) by mouth daily. 120 each 1   No current facility-administered medications on file  prior to visit.     Past Surgical History:  Procedure Laterality Date  . ESOPHAGOGASTRODUODENOSCOPY    . TONSILLECTOMY AND ADENOIDECTOMY      Allergies  Allergen Reactions  . Bee Venom   . Soy Allergy     Per the pt he eats soy products without sx.  He said he tested positive to soy on an allergy test.    Social History   Social History  . Marital status: Single    Spouse name: N/A  . Number of children: N/A  . Years of education: N/A   Occupational History  . Food Service Unemployed   Social History Main Topics  . Smoking status: Former Smoker    Types: Cigarettes    Start date: 08/25/2014    Quit date: 02/25/2015  . Smokeless tobacco: Never Used  . Alcohol use No  . Drug use: No     Comment: twice  to three times per week.  Last smoked 10-19-2013  . Sexual activity: Yes    Birth control/ protection: Condom   Other Topics Concern  . Not on file   Social History Narrative   Lives in [place] with mother.     Goes to [employer] in [place] - taking General Education classes to get a AA Degree.    Family History  Problem Relation Age of Onset  . Diabetes Mother   . Hypertension Mother   .  Colon cancer Maternal Grandfather   . Hyperlipidemia Maternal Grandfather   . Diabetes Maternal Grandfather   . Diabetes Maternal Grandmother   . Esophageal cancer Neg Hx   . Rectal cancer Neg Hx   . Stomach cancer Neg Hx   . Prostate cancer Neg Hx   . Pancreatic cancer Neg Hx     BP 121/81   Pulse 72   Ht 5\' 11"  (1.803 m)   Wt 220 lb (99.8 kg)   BMI 30.68 kg/m   Review of Systems: See HPI above.     Objective:  Physical Exam:  Gen: NAD, comfortable in exam room  Left knee: No gross deformity, ecchymoses, swelling. No TTP joint lines.  Very mild tenderness quad tendon area, hamstring tendons, pes bursa. FROM. Negative ant/post drawers. Negative valgus/varus testing. Negative lachmanns. Negative mcmurrays, apleys, patellar apprehension. NV intact  distally.  Right knee: FROM without pain.   Assessment & Plan:  1. Left knee pain - independently reviewed radiographs and no evidence fracture, ossific loose body.  Consistent with quad tendinitis/strain with some secondary hamstring strain and pes bursitis.  Icing, diclofenac, sleeve.  Shown home exercises to do daily.  F/u in 6 weeks.  Consider MRI if not improving.

## 2016-01-12 NOTE — Assessment & Plan Note (Signed)
independently reviewed radiographs and no evidence fracture, ossific loose body.  Consistent with quad tendinitis/strain with some secondary hamstring strain and pes bursitis.  Icing, diclofenac, sleeve.  Shown home exercises to do daily.  F/u in 6 weeks.  Consider MRI if not improving.

## 2016-01-28 ENCOUNTER — Ambulatory Visit: Payer: Medicaid Other | Admitting: Nurse Practitioner

## 2016-02-10 ENCOUNTER — Ambulatory Visit: Payer: Medicaid Other | Admitting: Nurse Practitioner

## 2016-02-22 ENCOUNTER — Ambulatory Visit: Payer: Medicaid Other | Admitting: Family Medicine

## 2016-02-24 ENCOUNTER — Encounter: Payer: Self-pay | Admitting: Family Medicine

## 2016-02-24 ENCOUNTER — Ambulatory Visit (INDEPENDENT_AMBULATORY_CARE_PROVIDER_SITE_OTHER): Payer: Medicaid Other | Admitting: Family Medicine

## 2016-02-24 DIAGNOSIS — M25562 Pain in left knee: Secondary | ICD-10-CM

## 2016-02-24 NOTE — Progress Notes (Addendum)
PCP: Carlyle Dolly, MD  Subjective:   HPI: Patient is a 26 y.o. male here for left knee pain.  01/11/16: Patient reports he's had about 1-2 months of anterior knee pain. No known injury or trauma. Difficulty with bending the knee - feels pain in quad tendon area with this. Questionable swelling. Tried OTC nsaids. Pain is up to 9/10, sharp. Gets some pain in calf also. No skin changes, numbness.  02/24/16: Patient returns with continued 8/10 sharp pain left knee. Pain worse with walking and trying to bend the knee. Pain is anterior and posterior. Wearing sleeve, doing home exercises. Taking tylenol and advil without much relief. No skin changes, numbness.  Past Medical History:  Diagnosis Date  . ADHD (attention deficit hyperactivity disorder)   . Allergy   . Asthma   . Bipolar disorder ([place])   . GERD (gastroesophageal reflux disease) 04/25/2012  . H/O chlamydia infection   . History of seasonal allergies     Current Outpatient Prescriptions on File Prior to Visit  Medication Sig Dispense Refill  . albuterol (PROAIR HFA) 108 (90 Base) MCG/ACT inhaler Inhale 2 puffs into the lungs every 6 (six) hours as needed for wheezing or shortness of breath.    Marland Kitchen aspirin-acetaminophen-caffeine (EXCEDRIN MIGRAINE) 250-250-65 MG per tablet Take 2 tablets by mouth every 6 (six) hours as needed for headache.    . beclomethasone (QVAR) 80 MCG/ACT inhaler Inhale 2 puffs into the lungs daily. 2 puffs each morning, rinse mouth after use 1 Inhaler 2  . cetirizine (ZYRTEC) 10 MG chewable tablet Chew 1 tablet (10 mg total) by mouth daily. 30 tablet 5  . diclofenac (VOLTAREN) 75 MG EC tablet Take 1 tablet (75 mg total) by mouth 2 (two) times daily. 30 tablet 0  . EPINEPHrine (EPIPEN 2-PAK) 0.3 mg/0.3 mL IJ SOAJ injection Inject 0.3 mLs (0.3 mg total) into the muscle once. 1 Device 2  . hydrOXYzine (ATARAX/VISTARIL) 25 MG tablet Take 1 tablet (25 mg total) by mouth 3 (three) times daily as  needed. 30 tablet 0  . pantoprazole (PROTONIX) 40 MG tablet TAKE 1 TABLET (40 MG TOTAL) BY MOUTH DAILY BEFORE BREAKFAST. 30 tablet 0  . senna (SENOKOT) 8.6 MG TABS tablet Take 1 tablet (8.6 mg total) by mouth daily. 120 each 1   No current facility-administered medications on file prior to visit.     Past Surgical History:  Procedure Laterality Date  . ESOPHAGOGASTRODUODENOSCOPY    . TONSILLECTOMY AND ADENOIDECTOMY      Allergies  Allergen Reactions  . Bee Venom   . Soy Allergy     Per the pt he eats soy products without sx.  He said he tested positive to soy on an allergy test.    Social History   Social History  . Marital status: Single    Spouse name: N/A  . Number of children: N/A  . Years of education: N/A   Occupational History  . Food Service Unemployed   Social History Main Topics  . Smoking status: Former Smoker    Types: Cigarettes    Start date: 08/25/2014    Quit date: 02/25/2015  . Smokeless tobacco: Never Used  . Alcohol use No  . Drug use: No     Comment: twice  to three times per week.  Last smoked 10-19-2013  . Sexual activity: Yes    Birth control/ protection: Condom   Other Topics Concern  . Not on file   Social History Narrative   Lives  in [place] with mother.     Goes to [employer] in [place] - taking General Education classes to get a AA Degree.    Family History  Problem Relation Age of Onset  . Diabetes Mother   . Hypertension Mother   . Colon cancer Maternal Grandfather   . Hyperlipidemia Maternal Grandfather   . Diabetes Maternal Grandfather   . Diabetes Maternal Grandmother   . Esophageal cancer Neg Hx   . Rectal cancer Neg Hx   . Stomach cancer Neg Hx   . Prostate cancer Neg Hx   . Pancreatic cancer Neg Hx     BP 124/75   Pulse 91   Ht 5\' 11"  (1.803 m)   Wt 220 lb (99.8 kg)   BMI 30.68 kg/m   Review of Systems: See HPI above.     Objective:  Physical Exam:  Gen: NAD, comfortable in exam room  Left knee: No gross  deformity, ecchymoses, effusion. No TTP joint lines.  Very mild tenderness quad tendon area, hamstring tendons, pes bursa. FROM. Negative ant/post drawers. Negative valgus/varus testing. Negative lachmanns. Mild pain mcmurrays, negative apleys and patellar apprehension. NV intact distally.  Right knee: FROM without pain.   Assessment & Plan:  1. Left knee pain - independently reviewed radiographs and no evidence fracture, ossific loose body.  Very unusual that despite home exercises, sleeve, ibuprofen, tylenol he hasn't noted any improvement.  We had discussed possibility of meniscus tear, loose body given his current symptoms.  At this point should go ahead with MRI to assess for these.  Continue icing, tylenol, aleve, sleeve in meantime.    Addendum:  MRI reviewed and discussed with patient.  No evidence meniscus tear or other concerning abnormalities.  Will go ahead with physical therapy for quad tendinitis - only allowed one visit with insurance.  F/u in 6 weeks.

## 2016-02-24 NOTE — Patient Instructions (Signed)
We will go ahead with an MRI of your knee to assess for a meniscus tear.   Continue with current treatment for now until we get those results.

## 2016-02-24 NOTE — Assessment & Plan Note (Signed)
independently reviewed radiographs and no evidence fracture, ossific loose body.  Very unusual that despite home exercises, sleeve, ibuprofen, tylenol he hasn't noted any improvement.  We had discussed possibility of meniscus tear, loose body given his current symptoms.  At this point should go ahead with MRI to assess for these.  Continue icing, tylenol, aleve, sleeve in meantime.

## 2016-03-08 NOTE — Addendum Note (Signed)
Addended by: Sherrie George F on: 03/08/2016 09:02 AM   Modules accepted: Orders

## 2016-03-09 ENCOUNTER — Encounter (HOSPITAL_COMMUNITY): Payer: Self-pay | Admitting: *Deleted

## 2016-03-09 ENCOUNTER — Emergency Department (HOSPITAL_COMMUNITY)
Admission: EM | Admit: 2016-03-09 | Discharge: 2016-03-10 | Disposition: A | Discharge status: Home / Self Care | Payer: No Typology Code available for payment source | Attending: Emergency Medicine | Admitting: Emergency Medicine

## 2016-03-09 DIAGNOSIS — Y999 Unspecified external cause status: Secondary | ICD-10-CM | POA: Insufficient documentation

## 2016-03-09 DIAGNOSIS — Y92410 Unspecified street and highway as the place of occurrence of the external cause: Secondary | ICD-10-CM | POA: Diagnosis not present

## 2016-03-09 DIAGNOSIS — Z79899 Other long term (current) drug therapy: Secondary | ICD-10-CM | POA: Diagnosis not present

## 2016-03-09 DIAGNOSIS — M545 Low back pain: Secondary | ICD-10-CM | POA: Diagnosis not present

## 2016-03-09 DIAGNOSIS — Y939 Activity, unspecified: Secondary | ICD-10-CM | POA: Diagnosis not present

## 2016-03-09 DIAGNOSIS — M546 Pain in thoracic spine: Secondary | ICD-10-CM | POA: Insufficient documentation

## 2016-03-09 DIAGNOSIS — Z7982 Long term (current) use of aspirin: Secondary | ICD-10-CM | POA: Diagnosis not present

## 2016-03-09 DIAGNOSIS — F909 Attention-deficit hyperactivity disorder, unspecified type: Secondary | ICD-10-CM | POA: Insufficient documentation

## 2016-03-09 DIAGNOSIS — Z87891 Personal history of nicotine dependence: Secondary | ICD-10-CM | POA: Insufficient documentation

## 2016-03-09 DIAGNOSIS — S199XXA Unspecified injury of neck, initial encounter: Secondary | ICD-10-CM | POA: Insufficient documentation

## 2016-03-09 DIAGNOSIS — J45909 Unspecified asthma, uncomplicated: Secondary | ICD-10-CM | POA: Diagnosis not present
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
## 2016-03-10 MED ORDER — IBUPROFEN 800 MG PO TABS
800.0000 mg | ORAL_TABLET | Freq: Once | ORAL | Status: AC
  Administered 2016-03-10: 800 mg via ORAL
  Filled 2016-03-10: qty 1

## 2016-03-10 MED ORDER — IBUPROFEN 800 MG PO TABS: 800.0000 mg | ORAL_TABLET | Freq: Three times a day (TID) | ORAL | 0 refills | Status: DC

## 2016-03-10 MED ORDER — CYCLOBENZAPRINE HCL 10 MG PO TABS: 10.0000 mg | ORAL_TABLET | Freq: Two times a day (BID) | ORAL | 0 refills | Status: DC | PRN

## 2016-03-10 NOTE — ED Notes (Signed)
Pt st's he is having pain in his neck and back and requesting something for pain.  Domenic Moras, PA made aware.

## 2016-03-12 ENCOUNTER — Ambulatory Visit (HOSPITAL_BASED_OUTPATIENT_CLINIC_OR_DEPARTMENT_OTHER)
Admission: RE | Admit: 2016-03-12 | Discharge: 2016-03-12 | Disposition: A | Discharge status: Home / Self Care | Payer: Medicaid Other | Source: Ambulatory Visit | Attending: Family Medicine | Admitting: Family Medicine

## 2016-03-12 DIAGNOSIS — M25562 Pain in left knee: Secondary | ICD-10-CM | POA: Diagnosis present

## 2016-03-12 DIAGNOSIS — M23301 Other meniscus derangements, unspecified lateral meniscus, left knee: Secondary | ICD-10-CM | POA: Diagnosis not present

## 2016-03-14 NOTE — Addendum Note (Signed)
Addended by: Sherrie George F on: 03/14/2016 04:36 PM   Modules accepted: Orders

## 2016-03-21 ENCOUNTER — Ambulatory Visit: Payer: Medicaid Other | Attending: Family Medicine | Admitting: Physical Therapy

## 2016-03-28 ENCOUNTER — Ambulatory Visit: Payer: Medicaid Other | Attending: Family Medicine | Admitting: Physical Therapy

## 2016-03-29 ENCOUNTER — Other Ambulatory Visit: Payer: Self-pay | Admitting: *Deleted

## 2016-03-29 MED ORDER — OLOPATADINE HCL 0.1 % OP SOLN: 1.0000 [drp] | Freq: Two times a day (BID) | OPHTHALMIC | 0 refills | Status: DC

## 2016-03-30 ENCOUNTER — Ambulatory Visit: Payer: Medicaid Other | Admitting: Allergy & Immunology

## 2016-04-14 ENCOUNTER — Ambulatory Visit: Payer: Medicaid Other | Admitting: Internal Medicine

## 2016-04-14 ENCOUNTER — Telehealth: Payer: Self-pay | Admitting: Internal Medicine

## 2016-05-12 ENCOUNTER — Telehealth: Payer: Self-pay | Admitting: Allergy & Immunology

## 2016-05-12 NOTE — Telephone Encounter (Signed)
patient is severely congested - wants FLONASE or decongestant called into pharmacy - [facility] [address]

## 2016-05-12 NOTE — Telephone Encounter (Signed)
Advised patient of this he has made an appt for 05/18/16 @ 1045 also advised he could get over the counter Flonase

## 2016-05-12 NOTE — Telephone Encounter (Signed)
Patient has not seen in over 1 year will need an office visit

## 2016-05-18 ENCOUNTER — Ambulatory Visit (INDEPENDENT_AMBULATORY_CARE_PROVIDER_SITE_OTHER): Payer: Medicaid Other | Admitting: Allergy

## 2016-05-18 ENCOUNTER — Encounter: Payer: Self-pay | Admitting: Allergy

## 2016-05-18 VITALS — BP 120/90 | HR 73 | Temp 97.9°F | Resp 16 | Ht 71.46 in | Wt 221.8 lb

## 2016-05-18 DIAGNOSIS — J4520 Mild intermittent asthma, uncomplicated: Secondary | ICD-10-CM

## 2016-05-18 DIAGNOSIS — Z91018 Allergy to other foods: Secondary | ICD-10-CM | POA: Diagnosis not present

## 2016-05-18 DIAGNOSIS — H1010 Acute atopic conjunctivitis, unspecified eye: Secondary | ICD-10-CM | POA: Diagnosis not present

## 2016-05-18 DIAGNOSIS — J309 Allergic rhinitis, unspecified: Secondary | ICD-10-CM

## 2016-05-18 MED ORDER — FLUTICASONE PROPIONATE 50 MCG/ACT NA SUSP: 2.0000 | Freq: Every day | NASAL | 5 refills | Status: DC

## 2016-05-18 MED ORDER — ALBUTEROL SULFATE HFA 108 (90 BASE) MCG/ACT IN AERS: 2.0000 | INHALATION_SPRAY | RESPIRATORY_TRACT | 2 refills | Status: DC | PRN

## 2016-05-18 MED ORDER — OLOPATADINE HCL 0.1 % OP SOLN: 1.0000 [drp] | Freq: Two times a day (BID) | OPHTHALMIC | 0 refills | Status: DC

## 2016-05-18 MED ORDER — EPINEPHRINE 0.3 MG/0.3ML IJ SOAJ: 0.3000 mg | Freq: Once | INTRAMUSCULAR | 2 refills | Status: AC

## 2016-05-18 NOTE — Progress Notes (Signed)
Follow-up Note  RE: Stephen Frank MRN: [id number] DOB: 08/13/1990 Date of Office Visit: 05/18/2016   History of present illness: Stephen Frank is a 26 y.o. male presenting today for follow-up of allergic rhinoconjunctivitis, asthma and history of nut allergy.  He was last seen in the office on 04/23/15 by Dr. Ishmael Holter.    His allergy symptoms are worse lately with pollen exposure. He has itchy eyes, nasal congestion and sneezing.  He has been out of his allergy medications for many months. He has previously used Zyrtec, Flonase, Astepro, Pazeo previously in the past with good improvement in his symptoms. He did buy over-the-counter Flonase recently to help with his nasal symptoms that he has been using 2 sprays daily.  Reports buying over-the-counter Flonase was quite expensive for him.  He reports his asthma has been under really good control and he denies any daytime or nighttime symptoms. He no longer has access to an albuterol. He was on Qvar in the past but reports he has not used this in several years. He denies any oral steroids the past year and no ED or urgent care or hospitalizations for his breathing. He would like a refill of his albuterol inhaler.  He reports that he does avoid walnuts but is able to eat other tree nuts and foods that have been packaged or made on shared equipment. He also reports he eats peanuts and soy without any issue. He does not have an EpiPen and reports he needs a refill of this. He was advised to get serum IgE levels after his last visit for nuts however he did not do this.  He denies any major health changes, surgeries or hospitalizations over the past year.    Review of systems: Review of Systems  Constitutional: Negative for chills, fever and malaise/fatigue.  HENT: Positive for congestion. Negative for ear discharge, ear pain, nosebleeds, sinus pain, sore throat and tinnitus.   Eyes: Positive for discharge and redness. Negative for pain.    Respiratory: Negative for cough, shortness of breath and wheezing.   Cardiovascular: Negative for chest pain.  Gastrointestinal: Negative for abdominal pain, heartburn, nausea and vomiting.  Musculoskeletal: Negative for joint pain and myalgias.  Skin: Negative for itching and rash.  Neurological: Negative for headaches.    All other systems negative unless noted above in HPI  Past medical/social/surgical/family history have been reviewed and are unchanged unless specifically indicated below.  No changes  Medication List: Allergies as of 05/18/2016      Reactions   Bee Venom    Soy Allergy    Per the pt he eats soy products without sx.  He said he tested positive to soy on an allergy test.      Medication List       Accurate as of 05/18/16  1:33 PM. Always use your most recent med list.          albuterol 108 (90 Base) MCG/ACT inhaler Commonly known as:  PROAIR HFA Inhale 2 puffs into the lungs every 4 (four) hours as needed for wheezing or shortness of breath.   aspirin-acetaminophen-caffeine 250-250-65 MG tablet Commonly known as:  EXCEDRIN MIGRAINE Take 2 tablets by mouth every 6 (six) hours as needed for headache.   beclomethasone 80 MCG/ACT inhaler Commonly known as:  QVAR Inhale 2 puffs into the lungs daily. 2 puffs each morning, rinse mouth after use   cetirizine 10 MG chewable tablet Commonly known as:  ZYRTEC Chew 1 tablet (10 mg  total) by mouth daily.   cyclobenzaprine 10 MG tablet Commonly known as:  FLEXERIL Take 1 tablet (10 mg total) by mouth 2 (two) times daily as needed for muscle spasms.   diclofenac 75 MG EC tablet Commonly known as:  VOLTAREN Take 1 tablet (75 mg total) by mouth 2 (two) times daily.   EPINEPHrine 0.3 mg/0.3 mL Soaj injection Commonly known as:  EPIPEN 2-PAK Inject 0.3 mLs (0.3 mg total) into the muscle once.   fluticasone 50 MCG/ACT nasal spray Commonly known as:  FLONASE Place 2 sprays into both nostrils daily.    hydrOXYzine 25 MG tablet Commonly known as:  ATARAX/VISTARIL Take 1 tablet (25 mg total) by mouth 3 (three) times daily as needed.   ibuprofen 800 MG tablet Commonly known as:  ADVIL,MOTRIN Take 1 tablet (800 mg total) by mouth 3 (three) times daily.   olopatadine 0.1 % ophthalmic solution Commonly known as:  PATANOL Place 1 drop into both eyes 2 (two) times daily.   pantoprazole 40 MG tablet Commonly known as:  PROTONIX TAKE 1 TABLET (40 MG TOTAL) BY MOUTH DAILY BEFORE BREAKFAST.   senna 8.6 MG Tabs tablet Commonly known as:  SENOKOT Take 1 tablet (8.6 mg total) by mouth daily.       Known medication allergies: Allergies  Allergen Reactions  . Bee Venom   . Soy Allergy     Per the pt he eats soy products without sx.  He said he tested positive to soy on an allergy test.     Physical examination: Blood pressure 120/90, pulse 73, temperature 97.9 F (36.6 C), temperature source Oral, resp. rate 16, height 5' 11.46" (1.815 m), weight 221 lb 12.8 oz (100.6 kg), SpO2 98 %.  General: Alert, interactive, in no acute distress. HEENT: PERRLA, TMs pearly gray, turbinates moderately edematous with clear discharge, post-pharynx non erythematous. Neck: Supple without lymphadenopathy. Lungs: Clear to auscultation without wheezing, rhonchi or rales. {no increased work of breathing. CV: Normal S1, S2 without murmurs. Abdomen: Nondistended, nontender. Skin: Warm and dry, without lesions or rashes. Extremities:  No clubbing, cyanosis or edema. Neuro:   Grossly intact.  Diagnositics/Labs:  Spirometry: FEV1: 3.71L  93%, FVC: 4.36L  91%, ratio consistent with Nonobstructive pattern  Assessment and plan:   Allergic rhinoconjunctivitis  - He has been off most of his allergy medicines for quite some time now. Will resume allergy regimen as below: Zyrtec 10mg  once daily. Flonase 2 sprays each nostril each morning. Astepro 2 sprays each nostril twice a day for nasal  drainage/post-nasal drip. Pataday one drop once daily as needed.  Mild intermittent asthma  - Appears under good control  - Continue as needed albuterol 2 puffs every 4 hours as needed for cough, wheeze, shortness of breath or chest tightness.    History of nut allergy  - Walnut is the only treatment that he avoids however he does eat other tree nuts without issue  - Obtain Walnut IgE level.  - Epi-pen/benadryl as needed.    Follow-up in 1 year or sooner if needed.   I appreciate the opportunity to take part in Griffen's care. Please do not hesitate to contact me with questions.  Sincerely,   Prudy Feeler, MD Allergy/Immunology [facility]

## 2016-05-18 NOTE — Patient Instructions (Signed)
Zyrtec 10mg  once daily.  Flonase 2 sprays each nostril each morning.  Astepro 2 sprays each nostril twice a day for nasal drainage/post-nasal drip.  Pataday one drop once daily as needed.  Epi-pen/benadryl as needed.    Albuterol 2 puffs every 4 hours as needed for cough, wheeze, shortness of breath or chest tightness.    Obtain Walnut IgE level.    Follow-up in 1 year or sooner if needed.

## 2016-05-26 ENCOUNTER — Ambulatory Visit: Payer: Medicaid Other | Admitting: Internal Medicine

## 2016-05-27 ENCOUNTER — Encounter: Payer: Self-pay | Admitting: *Deleted

## 2016-06-01 ENCOUNTER — Telehealth: Payer: Self-pay | Admitting: Family Medicine

## 2016-06-01 NOTE — Telephone Encounter (Signed)
Pt needs a letter for social services stating pt is disabled and has been seen here at [facility] the 2017 benefit year. Please call mom when it is ready for pick up. [phone]. ep

## 2016-06-02 NOTE — Telephone Encounter (Signed)
Called patient's mother back to clarify request. Patient's mother states that his disability is ADHD and Bipolar, these conditions are managed by [facility] not our office. Stated that I could write a letter saying he has been seen in our clinic in the 2017 year but would not label him as being disabled. Mother was understanding. Will leave letter at the front desk.   Smitty Cords, MD [place], PGY-2

## 2016-06-13 ENCOUNTER — Ambulatory Visit (INDEPENDENT_AMBULATORY_CARE_PROVIDER_SITE_OTHER): Payer: Medicaid Other | Admitting: Internal Medicine

## 2016-06-13 ENCOUNTER — Encounter: Payer: Self-pay | Admitting: Internal Medicine

## 2016-06-13 VITALS — BP 130/82 | HR 78 | Temp 98.2°F | Ht 71.0 in | Wt 222.6 lb

## 2016-06-13 DIAGNOSIS — R229 Localized swelling, mass and lump, unspecified: Secondary | ICD-10-CM

## 2016-06-13 NOTE — Progress Notes (Signed)
   Subjective:    Stephen Frank - 26 y.o. male MRN [id number]  Date of birth: 05-07-1990  HPI  Stephen Frank is here for pain of right leg. Reports he has a palpable nodule on the medial aspect of his right leg that has been present for about 2-3 weeks and painful. It is painful only to the touch and not at rest or with physical activity. Pain does not radiate into foot or up the right leg. There has been no edema, erythema, or drainage of the area. No weakness of lower extremity. No numbness/tingling. Reports a couple months ago he had a nodule with similar pain on the lateral side of his right lower leg. That has since resolved. He has been followed by [facility] earlier this year for left quadriceps tendonitis. No other muscular or joint pain or edema. Afebrile.   -  reports that he has been smoking Cigarettes.  He started smoking about 21 months ago. He has never used smokeless tobacco. - Review of Systems: Per HPI. - Past Medical History: Patient Active Problem List   Diagnosis Date Noted  . Left knee pain 01/12/2016  . Fever 11/01/2015  . Tick bite of abdomen 09/29/2015  . Rash and nonspecific skin eruption 02/24/2015  . Lichen simplex, chronic 11/06/2014  . Elevated BP 09/20/2014  . Dyspepsia 08/12/2014  . 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 Gynecomastia 08/13/2013  . GERD (gastroesophageal reflux disease) 04/25/2012  . Bipolar disorder ([place]) 12/14/2011   - Medications: reviewed and updated   Objective:   Physical Exam BP 130/82   Pulse 78   Temp 98.2 F (36.8 C) (Oral)   Ht 5\' 11"  (1.803 m)   Wt 222 lb 9.6 oz (101 kg)   SpO2 98%   BMI 31.05 kg/m  Gen: NAD, alert, cooperative with exam, well-appearing Skin: Palpable small, approximately 0.5 cm by 0.5 cm, subcutaneous nodule on the medial aspect of the right lower leg, about 6 inches above the medial malleolus. Nodule is TTP. No overlying erythema, increased warmth, edema or drainage present.  MSK: Full ROM of right LE at  knee and ankle.  Neuro: Sensation grossly intact to right LE. Strength 5/5 of LE bilaterally at ankles and knees. Normal gait.     Assessment & Plan:   Skin nodule Unclear etiology. No signs of overlying skin changes that would be concerning for cellulitis. Area is not indurated or fluctuant concerning for abscess. Will plan to have patient to return in one month for follow up. If nodule not present at f/u, would not complete further work up. If nodule still present or has new nodules/joint pain/joint edema, would consider rheumatologic work up. Discussed case with Dr. Nori Riis who helped to develop this plan.    Phill Myron, D.O. 06/13/2016, 11:11 AM PGY-2, [place]

## 2016-07-15 ENCOUNTER — Encounter: Payer: Self-pay | Admitting: Family Medicine

## 2016-07-15 ENCOUNTER — Ambulatory Visit (INDEPENDENT_AMBULATORY_CARE_PROVIDER_SITE_OTHER): Payer: Medicaid Other | Admitting: Family Medicine

## 2016-07-15 ENCOUNTER — Other Ambulatory Visit (HOSPITAL_COMMUNITY)
Admission: RE | Admit: 2016-07-15 | Discharge: 2016-07-15 | Disposition: A | Discharge status: Home / Self Care | Payer: Medicaid Other | Source: Ambulatory Visit | Attending: Family Medicine | Admitting: Family Medicine

## 2016-07-15 VITALS — BP 110/74 | HR 79 | Temp 98.1°F | Ht 71.0 in | Wt 220.0 lb

## 2016-07-15 DIAGNOSIS — H6123 Impacted cerumen, bilateral: Secondary | ICD-10-CM

## 2016-07-15 DIAGNOSIS — Z7251 High risk heterosexual behavior: Secondary | ICD-10-CM

## 2016-07-15 DIAGNOSIS — Z113 Encounter for screening for infections with a predominantly sexual mode of transmission: Secondary | ICD-10-CM

## 2016-07-15 DIAGNOSIS — J069 Acute upper respiratory infection, unspecified: Secondary | ICD-10-CM | POA: Diagnosis not present

## 2016-07-15 MED ORDER — FLUTICASONE PROPIONATE 50 MCG/ACT NA SUSP: 2.0000 | Freq: Every day | NASAL | 5 refills | Status: DC

## 2016-07-15 MED ORDER — CETIRIZINE HCL 10 MG PO TABS: 10.0000 mg | ORAL_TABLET | Freq: Every day | ORAL | 5 refills | Status: DC

## 2016-07-15 NOTE — Assessment & Plan Note (Signed)
No current STD symptoms. Is sexually active with more than 1 male. Refuses to use condoms as he doesn't like how they feel. Wants to come in frequently for STD testing because he doesn't want to wear condoms. -We will check urine GC chlamydia -RPR and HIV -Due to risky behavior, discussed starting PReP medication to prevent HIV. Patient agreeable to this and is interested in starting. Will check CMP and hepatitis panel first before starting prep. We will call him with this result and go from there.

## 2016-07-15 NOTE — Patient Instructions (Signed)
Thank you for coming in today, it was so nice to see you! Today we talked about:    Sinus issues: Use flonase every day and zyrtec  We have checked you for STDs today  Please follow up as needed. You can schedule this appointment at the front desk before you leave or call the clinic.  If we ordered any tests today, you will be notified via telephone of any abnormalities. If everything is normal you will get a letter in the mail.   If you have any questions or concerns, please do not hesitate to call the office at [phone]. You can also message me directly via MyChart.   Sincerely,  Smitty Cords, MD   Safe Sex Practicing safe sex means taking steps before and during sex to reduce your risk of:  Getting an STD (sexually transmitted disease).  Giving your partner an STD.  Unwanted pregnancy.  How can I practice safe sex?  To practice safe sex:  Limit your sexual partners to only one partner who is having sex with only you.  Avoid using alcohol and recreational drugs before having sex. These substances can affect your judgment.  Before having sex with a new partner: ? Talk to your partner about past partners, past STDs, and drug use. ? You and your partner should be screened for STDs and discuss the results with each other.  Check your body regularly for sores, blisters, rashes, or unusual discharge. If you notice any of these problems, visit your health care provider.  If you have symptoms of an infection or you are being treated for an STD, avoid sexual contact.  While having sex, use a condom. Make sure to: ? Use a condom every time you have vaginal, oral, or anal sex. Both females and males should wear condoms during oral sex. ? Keep condoms in place from the beginning to the end of sexual activity. ? Use a latex condom, if possible. Latex condoms offer the best protection. ? Use only water-based lubricants or oils to lubricate a condom. Using petroleum-based  lubricants or oils will weaken the condom and increase the chance that it will break.  See your health care provider for regular screenings, exams, and tests for STDs.  Talk with your health care provider about the form of birth control (contraception) that is best for you.  Get vaccinated against hepatitis B and human papillomavirus (HPV).  If you are at risk of being infected with HIV (human immunodeficiency virus), talk with your health care provider about taking a prescription medicine to prevent HIV infection. You are considered at risk for HIV if: ? You are a man who has sex with other men. ? You are a heterosexual man or woman who is sexually active with more than one partner. ? You take drugs by injection. ? You are sexually active with a partner who has HIV.  This information is not intended to replace advice given to you by your health care provider. Make sure you discuss any questions you have with your health care provider. Document Released: 02/18/2004 Document Revised: 05/27/2015 Document Reviewed: 11/30/2014 Elsevier Interactive Patient Education  [employer].

## 2016-07-15 NOTE — Progress Notes (Signed)
Subjective:    Patient ID: Stephen Frank , male   DOB: [birth year] , 26 y.o..   MRN: [id number]  HPI  Stephen Frank is here for A same day visit for Chief Complaint  Patient presents with  . STD testing    1.STD check: No symptoms. Has been having unprotected sex. Has had about 2-3 Denies any discharge from his penis, penile or testicular pain. Notes that he has had an STD in the past which she was treated for. partners in the last couple months. Does not use condoms because doesn't like how it feels. No fevers.   2. Sinus issues: Has had a runny nose for the last couple days. No fevers. Has had a mild headache. He took a sinus medication pill but doesn't know what it was. Notes that he did have a sore throat but not anymore. Endorses a previus cough with clear phlegm. No shortness of breath or wheezing. PO intake good. Urinating and stooling appropriately.   Review of Systems: Per HPI. All other systems reviewed and are negative.  Past Medical History: Patient Active Problem List   Diagnosis Date Noted  . URI (upper respiratory infection) 07/15/2016  . Left knee pain 01/12/2016  . Fever 11/01/2015  . Tick bite of abdomen 09/29/2015  . Rash and nonspecific skin eruption 02/24/2015  . Lichen simplex, chronic 11/06/2014  . Elevated BP 09/20/2014  . Dyspepsia 08/12/2014  . 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 Gynecomastia 08/13/2013  . Risky sexual behavior 12/24/2012  . GERD (gastroesophageal reflux disease) 04/25/2012  . Bipolar disorder ([place]) 12/14/2011    Medications: reviewed and updated Current Outpatient Prescriptions  Medication Sig Dispense Refill  . albuterol (PROAIR HFA) 108 (90 Base) MCG/ACT inhaler Inhale 2 puffs into the lungs every 4 (four) hours as needed for wheezing or shortness of breath. 1 Inhaler 2  . aspirin-acetaminophen-caffeine (EXCEDRIN MIGRAINE) 093-267-12 MG per tablet Take 2 tablets by mouth every 6 (six) hours as needed for headache.    .  beclomethasone (QVAR) 80 MCG/ACT inhaler Inhale 2 puffs into the lungs daily. 2 puffs each morning, rinse mouth after use (Patient not taking: Reported on 05/18/2016) 1 Inhaler 2  . cetirizine (ZYRTEC) 10 MG tablet Take 1 tablet (10 mg total) by mouth daily. 30 tablet 5  . fluticasone (FLONASE) 50 MCG/ACT nasal spray Place 2 sprays into both nostrils daily. 1 g 5  . hydrOXYzine (ATARAX/VISTARIL) 25 MG tablet Take 1 tablet (25 mg total) by mouth 3 (three) times daily as needed. (Patient not taking: Reported on 05/18/2016) 30 tablet 0  . ibuprofen (ADVIL,MOTRIN) 800 MG tablet Take 1 tablet (800 mg total) by mouth 3 (three) times daily. (Patient not taking: Reported on 05/18/2016) 21 tablet 0  . olopatadine (PATANOL) 0.1 % ophthalmic solution Place 1 drop into both eyes 2 (two) times daily. 5 mL 0  . pantoprazole (PROTONIX) 40 MG tablet TAKE 1 TABLET (40 MG TOTAL) BY MOUTH DAILY BEFORE BREAKFAST. 30 tablet 0   No current facility-administered medications for this visit.     Social Hx:  reports that he has been smoking Cigarettes.  He started smoking about 22 months ago. He has never used smokeless tobacco.   Objective:   BP 110/74   Pulse 79   Temp 98.1 F (36.7 C) (Oral)   Ht 5\' 11"  (1.803 m)   Wt 220 lb (99.8 kg)   SpO2 98%   BMI 30.68 kg/m  Physical Exam  Gen: NAD, alert, cooperative  with exam, well-appearing HEENT:     Head: Normocephalic, atraumatic    Neck: No masses palpated. No goiter. No lymphadenopathy     Ears: External ears normal, no drainage.Tympanic membranes unable to visualize as there is cerumen impaction bilaterally    Eyes: PERRLA, EOMI, sclera white, normal conjunctiva    Nose: nasal turbinates moist with swollen and edematous nasal mucosa. Clear nasal discharge bilaterally     Throat: moist mucus membranes, no pharyngeal erythema, no tonsillar exudate. Airway is patent, No lymphadenopathy.  Cardiac: Regular rate and rhythm, normal S1/S2, no murmur, no edema,  capillary refill brisk  Respiratory: Clear to auscultation bilaterally, no wheezes, non-labored breathing Psych: good insight, normal mood and affect  Assessment & Plan:  Risky sexual behavior No current STD symptoms. Is sexually active with more than 1 male. Refuses to use condoms as he doesn't like how they feel. Wants to come in frequently for STD testing because he doesn't want to wear condoms. -We will check urine GC chlamydia -RPR and HIV -Due to risky behavior, discussed starting PReP medication to prevent HIV. Patient agreeable to this and is interested in starting. Will check CMP and hepatitis panel first before starting prep. We will call him with this result and go from there.  URI (upper respiratory infection) History and exam consistent with this. Also patient has been smoking cigarettes which is likely contributing to his symptoms. -Discussed conservative measures including playing rest, plenty of fluids, warm tea with honey -Flonase twice a day and Zyrtec refilled as patient also has underlying seasonal allergies -Follow-up if symptoms worsen or fail to improve as anticipated  Orders Placed This Encounter  Procedures  . GC/Chlamydia Probe Amp    Order Specific Question:   Source    Answer:   urine  . HIV antibody  . RPR  . Comprehensive metabolic panel    Order Specific Question:   Has the patient fasted?    Answer:   No  . Hepatitis panel, acute   Meds ordered this encounter  Medications  . cetirizine (ZYRTEC) 10 MG tablet    Sig: Take 1 tablet (10 mg total) by mouth daily.    Dispense:  30 tablet    Refill:  5  . fluticasone (FLONASE) 50 MCG/ACT nasal spray    Sig: Place 2 sprays into both nostrils daily.    Dispense:  1 g    Refill:  [place], MD [place], PGY-2

## 2016-07-15 NOTE — Assessment & Plan Note (Signed)
History and exam consistent with this. Also patient has been smoking cigarettes which is likely contributing to his symptoms. -Discussed conservative measures including playing rest, plenty of fluids, warm tea with honey -Flonase twice a day and Zyrtec refilled as patient also has underlying seasonal allergies -Follow-up if symptoms worsen or fail to improve as anticipated

## 2016-07-16 LAB — HIV ANTIBODY (ROUTINE TESTING W REFLEX): HIV SCREEN 4TH GENERATION: NONREACTIVE

## 2016-07-16 LAB — COMPREHENSIVE METABOLIC PANEL
ALK PHOS: 58 IU/L (ref 39–117)
ALT: 30 IU/L (ref 0–44)
AST: 27 IU/L (ref 0–40)
Albumin/Globulin Ratio: 1.6 (ref 1.2–2.2)
Albumin: 4.6 g/dL (ref 3.5–5.5)
BUN/Creatinine Ratio: 10 (ref 9–20)
BUN: 9 mg/dL (ref 6–20)
Bilirubin Total: 0.3 mg/dL (ref 0.0–1.2)
CALCIUM: 9.5 mg/dL (ref 8.7–10.2)
CO2: 23 mmol/L (ref 20–29)
CREATININE: 0.94 mg/dL (ref 0.76–1.27)
Chloride: 103 mmol/L (ref 96–106)
GFR calc Af Amer: 129 mL/min/{1.73_m2} (ref >59)
GFR calc non Af Amer: 111 mL/min/{1.73_m2} (ref >59)
GLOBULIN, TOTAL: 2.9 g/dL (ref 1.5–4.5)
GLUCOSE: 92 mg/dL (ref 65–99)
Potassium: 4.3 mmol/L (ref 3.5–5.2)
SODIUM: 141 mmol/L (ref 134–144)
Total Protein: 7.5 g/dL (ref 6.0–8.5)

## 2016-07-16 LAB — HEPATITIS PANEL, ACUTE
HEP A IGM: NEGATIVE
HEP B C IGM: NEGATIVE
HEP B S AG: NEGATIVE
Hep C Virus Ab: 0.1 s/co ratio (ref 0.0–0.9)

## 2016-07-16 LAB — RPR: RPR: NONREACTIVE

## 2016-07-18 LAB — URINE CYTOLOGY ANCILLARY ONLY
Chlamydia: NEGATIVE
Neisseria Gonorrhea: NEGATIVE

## 2016-07-21 ENCOUNTER — Encounter: Payer: Self-pay | Admitting: Family Medicine

## 2016-08-02 ENCOUNTER — Encounter: Payer: Self-pay | Admitting: Family Medicine

## 2016-08-09 ENCOUNTER — Ambulatory Visit: Payer: Medicaid Other | Admitting: Nurse Practitioner

## 2016-08-19 ENCOUNTER — Telehealth: Payer: Self-pay | Admitting: Family Medicine

## 2016-08-19 NOTE — Telephone Encounter (Signed)
Mom states she needs a letter for the IRS stating pt has been seen here in 2017, can not have the the year 2018 in the letter. Mom wants PCP to call her. ep

## 2016-08-24 ENCOUNTER — Encounter: Payer: Self-pay | Admitting: Family Medicine

## 2016-08-24 NOTE — Telephone Encounter (Signed)
2nd request. Mom wants PCP to call her ASAP. ep

## 2016-08-25 NOTE — Telephone Encounter (Signed)
I had already returned mother's call. Thank you.

## 2016-09-04 NOTE — Progress Notes (Deleted)
   Subjective:   Patient ID: Stephen Frank    DOB: 1990/09/15, 26 y.o. male   MRN: [id number]  CC: "***"  HPI: Stephen Frank is a 26 y.o. male who presents to [facility] today ***. Problems discussed today are as follows:  ***: *** ROS: ***  Complete ROS performed, see HPI for pertinent.  [place]: Bipolar disorder, GERD, gynecomastia, risky sexual behavior. Surgical history tonsillectomy. Family history DM, HTN. Smoking status reviewed. Medications reviewed.  Objective:   There were no vitals taken for this visit. Vitals and nursing note reviewed.  General: well nourished, well developed, in no acute distress with non-toxic appearance HEENT: normocephalic, atraumatic, moist mucous membranes Neck: supple, non-tender without lymphadenopathy CV: regular rate and rhythm without murmurs, rubs, or gallops, no lower extremity edema Lungs: clear to auscultation bilaterally with normal work of breathing Abdomen: soft, non-tender, non-distended, no masses or organomegaly palpable, normoactive bowel sounds Skin: warm, dry, no rashes or lesions, cap refill < 2 seconds Extremities: warm and well perfused, normal tone  Assessment & Plan:   No problem-specific Assessment & Plan notes found for this encounter.  No orders of the defined types were placed in this encounter.  No orders of the defined types were placed in this encounter.   Harriet [place], [place], PGY-2 09/04/2016 9:57 PM

## 2016-09-05 ENCOUNTER — Ambulatory Visit: Payer: Medicaid Other | Admitting: Family Medicine

## 2016-09-08 ENCOUNTER — Encounter: Payer: Self-pay | Admitting: Family Medicine

## 2016-09-08 ENCOUNTER — Ambulatory Visit (INDEPENDENT_AMBULATORY_CARE_PROVIDER_SITE_OTHER): Payer: Medicaid Other | Admitting: Family Medicine

## 2016-09-08 VITALS — BP 116/74 | HR 79 | Temp 98.3°F | Ht 71.0 in | Wt 219.2 lb

## 2016-09-08 DIAGNOSIS — S39012A Strain of muscle, fascia and tendon of lower back, initial encounter: Secondary | ICD-10-CM

## 2016-09-08 MED ORDER — CYCLOBENZAPRINE HCL 10 MG PO TABS: 10.0000 mg | ORAL_TABLET | Freq: Three times a day (TID) | ORAL | 0 refills | Status: DC | PRN

## 2016-09-08 NOTE — Assessment & Plan Note (Signed)
TTP bilateral mid-thoracic paraspinal muscles to lumbar without trauma or red flags.  - flexerfil 10mg  TID PRN x6 days - encouraged staying active - follow up in 4-6 weeks if symptoms persist - return precautions discussed

## 2016-09-08 NOTE — Progress Notes (Signed)
    Subjective:  Stephen Frank is a 26 y.o. male who presents to the [facility] today with a chief complaint of back pain  HPI:  BACK PAIN  Back pain began 10 days ago. Pain is described as being "locked up" and shooting back in lower back. Patient has tried tylenol and flexeril (that he had left), icy-hot, epsom salt bath. Pain radiates from lower back into upper back. History of trauma or injury: lifting furniture 2 weeks ago Prior history of similar pain: has had back pain in the past, but this was pulled muscles History of cancer: no History of IV drug use: no History of steroid use: no  Symptoms Incontinence of bowel or bladder:  no Numbness of leg: no Fever: no Rest or Night pain: no Weight Loss:  no Rash: no  ROS see HPI Smoking Status noted.    Objective:  Physical Exam: BP 116/74   Pulse 79   Temp 98.3 F (36.8 C) (Oral)   Ht 5\' 11"  (1.803 m)   Wt 219 lb 3.2 oz (99.4 kg)   SpO2 98%   BMI 30.57 kg/m   Gen: 26yo M in NAD, resting comfortably CV: RRR with no murmurs appreciated Pulm: NWOB, CTAB with no crackles, wheezes, or rhonchi GI: Normal bowel sounds present. Soft, Nontender, Nondistended. MSK: FROM, TTP bilateral paraspinal muscles mid-thoracic to lumbar, no spinal or SI joint tenderness, negative straight leg raise Skin: warm, dry Neuro: CN 2-12 WNL, no changes in sensation, strength 5/5 throughout Psych: Normal affect and thought content  No results found for this or any previous visit (from the past 72 hour(s)).   Assessment/Plan:  Strain of lumbar paraspinal muscle TTP bilateral mid-thoracic paraspinal muscles to lumbar without trauma or red flags.  - flexerfil 10mg  TID PRN x6 days - encouraged staying active - follow up in 4-6 weeks if symptoms persist - return precautions discussed

## 2016-09-08 NOTE — Patient Instructions (Signed)
Stephen Frank, you strained your paraspinal muscles on your mid to lower back. These are the muscles lining your spine on both sides. This can take several weeks to get better.  I am prescribing you a muscle relaxer to get over the hump.  You can take this up to three times daily. Continue to stay active and be careful when lifting heavy objects. If you begin to develop involuntary loss of urine or stool, fevers, chills or pain that does not improve when lying down or wakes you from sleep, please follow up urgently.   Please follow up in 4-6 weeks if you continue to have discomfort.    Take care, [name] L. Rosalyn Gess, [place] Resident PGY-2 09/08/2016 11:53 AM

## 2016-10-03 ENCOUNTER — Ambulatory Visit (INDEPENDENT_AMBULATORY_CARE_PROVIDER_SITE_OTHER): Payer: Medicaid Other | Admitting: Internal Medicine

## 2016-10-03 ENCOUNTER — Encounter: Payer: Self-pay | Admitting: Internal Medicine

## 2016-10-03 VITALS — BP 114/70 | HR 82 | Ht 71.0 in | Wt 221.0 lb

## 2016-10-03 DIAGNOSIS — R1013 Epigastric pain: Secondary | ICD-10-CM | POA: Diagnosis not present

## 2016-10-03 DIAGNOSIS — R12 Heartburn: Secondary | ICD-10-CM | POA: Diagnosis not present

## 2016-10-03 NOTE — Patient Instructions (Addendum)
You have been scheduled for a Barium Esophogram at [facility] (1st floor of the hospital) on 10/12/16 at 10:15 am. Please arrive 15 minutes prior to your appointment for registration. Make certain not to have anything to eat or drink 3 hours prior to your test. If you need to reschedule for any reason, please contact radiology at [phone] to do so. __________________________________________________________________ A barium swallow is an examination that concentrates on views of the esophagus. This tends to be a double contrast exam (barium and two liquids which, when combined, create a gas to distend the wall of the oesophagus) or single contrast (non-ionic iodine based). The study is usually tailored to your symptoms so a good history is essential. Attention is paid during the study to the form, structure and configuration of the esophagus, looking for functional disorders (such as aspiration, dysphagia, achalasia, motility and reflux) EXAMINATION You may be asked to change into a gown, depending on the type of swallow being performed. A radiologist and radiographer will perform the procedure. The radiologist will advise you of the type of contrast selected for your procedure and direct you during the exam. You will be asked to stand, sit or lie in several different positions and to hold a small amount of fluid in your mouth before being asked to swallow while the imaging is performed .In some instances you may be asked to swallow barium coated marshmallows to assess the motility of a solid food bolus. The exam can be recorded as a digital or video fluoroscopy procedure. POST PROCEDURE It will take 1-2 days for the barium to pass through your system. To facilitate this, it is important, unless otherwise directed, to increase your fluids for the next 24-48hrs and to resume your normal diet.  This test typically takes about 30 minutes to  perform. __________________________________________________________________________________   I appreciate the opportunity to care for you. Silvano Rusk, MD, [facility]

## 2016-10-03 NOTE — Progress Notes (Signed)
Stephen Frank 26 y.o. 03-10-1990 [id number]  Assessment & Plan:   Encounter Diagnoses  Name Primary?  . Heartburn Yes  . Epigastric pain     Question if he has under treated GERD, he is on a PPI, could he have functional disturbance, could he have motility disturbance. Barium swallow with tablet. This will help evaluate for motility disturbance of the esophagus. Some of his symptoms suggest achalasia to me.  Further plans pending that result. Could need repeat EGD, he had mild distal esophagitis in 2015. He does have a history of chronic GI symptoms PPI did help but is now not working as well.  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  I appreciate the opportunity to care for this patient. CC: Carlyle Dolly, MD    Subjective:   Chief Complaint:Heartburn and chest pain  HPI The patient is a very nice young African-American man known to me with history of epigastric pain and reflux disease. EGD in 2015 demonstrated some mild distal esophagitis he was treated with omeprazole and then switched to pantoprazole and has done well since I last saw him in 2016. Now he is having problems where he has burning chest pain in the lower sternal area, without radiation although there is some reflux like symptomatology with radiation up word. He doesn't regurgitate or spit up fluid. He hasn't urge her sensation like he needs to belch at times. Sometimes this is worse in the morning. Drinking water seems to help. There is no unintentional weight loss. 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 Allergies  Allergen Reactions  . Bee Venom   . Soy Allergy     Per the pt he eats soy products without sx.  He said he tested positive to soy on an allergy test.   Current Meds  Medication Sig  . albuterol (PROAIR HFA) 108 (90 Base) MCG/ACT inhaler Inhale 2 puffs into the lungs every 4 (four) hours as needed for wheezing or shortness  of breath.  Marland Kitchen aspirin-acetaminophen-caffeine (EXCEDRIN MIGRAINE) 250-250-65 MG per tablet Take 2 tablets by mouth every 6 (six) hours as needed for headache.  . cetirizine (ZYRTEC) 10 MG tablet Take 1 tablet (10 mg total) by mouth daily.  . cyclobenzaprine (FLEXERIL) 10 MG tablet Take 1 tablet (10 mg total) by mouth 3 (three) times daily as needed for muscle spasms.  . fluticasone (FLONASE) 50 MCG/ACT nasal spray Place 2 sprays into both nostrils daily.  . hydrOXYzine (ATARAX/VISTARIL) 25 MG tablet Take 1 tablet (25 mg total) by mouth 3 (three) times daily as needed.  Marland Kitchen ibuprofen (ADVIL,MOTRIN) 800 MG tablet Take 1 tablet (800 mg total) by mouth 3 (three) times daily.  Marland Kitchen olopatadine (PATANOL) 0.1 % ophthalmic solution Place 1 drop into both eyes 2 (two) times daily.  . pantoprazole (PROTONIX) 40 MG tablet TAKE 1 TABLET (40 MG TOTAL) BY MOUTH DAILY BEFORE BREAKFAST.   Past Medical History:  Diagnosis Date  . ADHD (attention deficit hyperactivity disorder)   . Allergy   . Asthma   . Bipolar disorder ([place])   . GERD (gastroesophageal reflux disease) 04/25/2012  . H/O chlamydia infection   . History of seasonal allergies    Past Surgical History:  Procedure Laterality Date  . ESOPHAGOGASTRODUODENOSCOPY    . TONSILLECTOMY AND ADENOIDECTOMY     Social History   Social History  . Marital status: Single   Occupational History  . Food Service  Unemployed   Social History Main Topics  . Smoking status: Current Some Day Smoker    Types: Cigarettes    Start date: 08/25/2014    Last attempt to quit: 02/25/2015  . Smokeless tobacco: Never Used  . Alcohol use No  . Drug use: No     Comment: twice  to three times per week.  Last smoked 10-19-2013  . Sexual activity: Yes    Birth control/ protection: Condom   Social History Narrative   Lives in [place] with mother.     Goes to [employer] in [place] - taking General Education classes to get a AA Degree.   Family history reviewed and updated in  the EMR   Review of Systems As per history of present illness  Objective:   Physical Exam Well-developed well-nourished young black man in no acute distress BP 114/70   Pulse 82   Ht 5\' 11"  (1.803 m)   Wt 221 lb (100.2 kg)   BMI 30.82 kg/m  Eyes are anicteric Lungs clear Heart sounds normal Abdomen is obese soft nontender no organomegaly or mass Sternum is nontender He appears alert and oriented 3 and has a common proper an appropriate affect and mood.

## 2016-10-12 ENCOUNTER — Ambulatory Visit (HOSPITAL_COMMUNITY)
Admission: RE | Admit: 2016-10-12 | Discharge: 2016-10-12 | Disposition: A | Discharge status: Home / Self Care | Payer: Medicaid Other | Source: Ambulatory Visit | Attending: Internal Medicine | Admitting: Internal Medicine

## 2016-10-12 DIAGNOSIS — R1013 Epigastric pain: Secondary | ICD-10-CM

## 2016-10-12 DIAGNOSIS — R12 Heartburn: Secondary | ICD-10-CM | POA: Diagnosis not present

## 2016-10-14 NOTE — Progress Notes (Signed)
I would not do more testing but suggest he try FD Gard 2 bid  F/u prn

## 2016-10-14 NOTE — Progress Notes (Signed)
This test is normal Please ask for an update on symptoms - was having esophageal burning, refulx sxs despite PPI

## 2016-12-26 ENCOUNTER — Ambulatory Visit: Payer: Medicaid Other | Admitting: Family Medicine

## 2016-12-29 ENCOUNTER — Ambulatory Visit: Payer: Medicaid Other | Admitting: Family Medicine

## 2017-01-02 ENCOUNTER — Ambulatory Visit: Payer: Medicaid Other | Admitting: Family Medicine

## 2017-03-07 ENCOUNTER — Encounter: Payer: Self-pay | Admitting: Family Medicine

## 2017-03-07 ENCOUNTER — Other Ambulatory Visit (HOSPITAL_COMMUNITY)
Admission: RE | Admit: 2017-03-07 | Discharge: 2017-03-07 | Disposition: A | Discharge status: Home / Self Care | Payer: Medicaid Other | Source: Ambulatory Visit | Attending: Family Medicine | Admitting: Family Medicine

## 2017-03-07 ENCOUNTER — Other Ambulatory Visit: Payer: Self-pay

## 2017-03-07 ENCOUNTER — Ambulatory Visit (INDEPENDENT_AMBULATORY_CARE_PROVIDER_SITE_OTHER): Payer: Medicaid Other | Admitting: Family Medicine

## 2017-03-07 VITALS — BP 138/76 | HR 94 | Temp 98.4°F | Ht 71.0 in | Wt 223.6 lb

## 2017-03-07 DIAGNOSIS — Z113 Encounter for screening for infections with a predominantly sexual mode of transmission: Secondary | ICD-10-CM | POA: Diagnosis not present

## 2017-03-07 DIAGNOSIS — Z720 Tobacco use: Secondary | ICD-10-CM | POA: Diagnosis not present

## 2017-03-07 DIAGNOSIS — F1290 Cannabis use, unspecified, uncomplicated: Secondary | ICD-10-CM

## 2017-03-07 DIAGNOSIS — Z0000 Encounter for general adult medical examination without abnormal findings: Secondary | ICD-10-CM

## 2017-03-07 DIAGNOSIS — Z0001 Encounter for general adult medical examination with abnormal findings: Secondary | ICD-10-CM | POA: Diagnosis not present

## 2017-03-07 DIAGNOSIS — K219 Gastro-esophageal reflux disease without esophagitis: Secondary | ICD-10-CM | POA: Diagnosis not present

## 2017-03-07 MED ORDER — OMEPRAZOLE 20 MG PO CPDR: 20.0000 mg | DELAYED_RELEASE_CAPSULE | Freq: Every day | ORAL | 3 refills | Status: DC

## 2017-03-07 NOTE — Patient Instructions (Addendum)
Thank you for coming in today, it was so nice to see you! Today we talked about:    You had your annual physical today.   You are doing well overall!.  I am proud of you for wearing condoms  The next thing is to stop smoking, this will be the best thing you can do for your health.  For the knot in your chest, this may be due to heartburn.  I have given you prescription for omeprazole 20 mg daily.  You need to take this every day.  If it is not helping then please let us know.  You may need to go back to your gastroenterologist.  Please follow up in 1 year for next physical. You can schedule this appointment at the front desk before you leave or call the clinic.  Bring in all your medications or supplements to each appointment for review.   If we ordered any tests today, you will be notified via telephone of any abnormalities. If everything is normal you will get a letter in the mail.   If you have any questions or concerns, please do not hesitate to call the office at [phone]. You can also message me directly via MyChart.   Sincerely,  Smitty Cords, MD

## 2017-03-07 NOTE — Progress Notes (Signed)
ur

## 2017-03-07 NOTE — Progress Notes (Signed)
Subjective:  Stephen Frank is a 27 y.o. year old male with past medical history of GERD, bipolar, risky sexual behavior, dyspepsia who presents to office today for an annual physical examination.  Concerns today include:  1. "knot in chest": Patient endorsing having a notch or "for ball" feeling in his chest at times.  He he tries to burp up this feeling and sometimes it helps.  He feels like sometimes his food may get stuck.  The feeling is usually after eating.  The he will get pain that goes into his shoulders and his back but otherwise no pain in the actual chest area.    Review of Systems  Constitutional: Negative for fever and weight loss.  HENT: Negative for ear pain, hearing loss and sinus pain.   Eyes: Negative for blurred vision.  Respiratory: Negative for cough, shortness of breath and wheezing.   Cardiovascular: Negative for chest pain and leg swelling.  Gastrointestinal: Negative for abdominal pain, blood in stool, constipation, diarrhea, heartburn, melena, nausea and vomiting.  Genitourinary: Negative for dysuria and frequency.  Musculoskeletal: Negative for back pain and joint pain.  Skin: Negative for rash.  Neurological: Negative for dizziness, tingling, focal weakness and headaches.  Psychiatric/Behavioral: Negative for depression and suicidal ideas.   General Healthcare: Medication Compliance: no Dx Hypertension: no Dx Hyperlipidemia: no Diabetes: no Dx Obesity: no Weight Loss: no Physical Activity: minimal Urinary Incontinence: no  Menstrual hx: n/a Last dental exam: 6 months ago  Social:  reports that he has been smoking cigarettes.  He started smoking about 2 years ago. he has never used smokeless tobacco. Driving: drives himself, wears seatbelt Alcohol Use: socially Tobacco abuse: yes, 1 PPD   Other Drugs: marijuana  Support and Life at Home: good support Advanced Directives: no Work: truck Music therapist Due  Topic Date Due  .  INFLUENZA VACCINE  08/24/2016    Past Medical History Past Medical History:  Diagnosis Date  . ADHD (attention deficit hyperactivity disorder)   . Allergy   . Asthma   . Bipolar disorder ([place])   . GERD (gastroesophageal reflux disease) 04/25/2012  . H/O chlamydia infection   . History of seasonal allergies    Patient Active Problem List   Diagnosis Date Noted  . Strain of lumbar paraspinal muscle 09/08/2016  . Left knee pain 01/12/2016  . Rash and nonspecific skin eruption 02/24/2015  . Lichen simplex, chronic 11/06/2014  . Elevated BP 09/20/2014  . Dyspepsia 08/12/2014  . 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 Gynecomastia 08/13/2013  . Risky sexual behavior 12/24/2012  . GERD (gastroesophageal reflux disease) 04/25/2012  . Bipolar disorder ([place]) 12/14/2011    Medications- reviewed and updated  Objective: BP 138/76   Pulse 94   Temp 98.4 F (36.9 C) (Oral)   Ht 5\' 11"  (1.803 m)   Wt 223 lb 9.6 oz (101.4 kg)   SpO2 98%   BMI 31.19 kg/m  Gen: In no acute distress, alert, cooperative with exam, well groomed HEENT: NCAT, EOMI, PERRL CV: Regular rate and rhythm, normal S1/S2, no murmur Resp: Clear to auscultation bilaterally, no wheezes, non-labored Abd: Soft, Non Tender, Non Distended, bowel sounds present, no guarding or organomegaly Ext: No edema, warm and well perfused Neuro: Alert and oriented, No gross deficits, normal gait Psych: Normal mood and affect  Assessment/Plan:  GERD (gastroesophageal reflux disease) Patient having "fur ball" feeling in chest after eating. Could be from GERD as he has a history of this. Start  Omeprazole 20 mg daily. If no improvement after a 4 weeks, would advise to return to GI  Screening for STD (sexually transmitted disease) - Urine cytology ancillary only - HIV antibody (with reflex) - RPR - continue condoms  Annual physical: Up to date on health maintenance besides flu. Declines flu shot at this time - CBC - Basic Metabolic  Panel  Tobacco Abuse: Counseled on quitting cigarettes as well as marijuana  Orders Placed This Encounter  Procedures  . HIV antibody (with reflex)  . CBC  . Basic Metabolic Panel  . RPR    Meds ordered this encounter  Medications  . omeprazole (PRILOSEC) 20 MG capsule    Sig: Take 1 capsule (20 mg total) by mouth daily.    Dispense:  30 capsule    Refill:  3     Smitty Cords, MD [place], PGY-3

## 2017-03-08 ENCOUNTER — Encounter: Payer: Self-pay | Admitting: Family Medicine

## 2017-03-08 LAB — CBC
HEMATOCRIT: 39.2 % (ref 37.5–51.0)
Hemoglobin: 13.3 g/dL (ref 13.0–17.7)
MCH: 28.7 pg (ref 26.6–33.0)
MCHC: 33.9 g/dL (ref 31.5–35.7)
MCV: 85 fL (ref 79–97)
Platelets: 219 10*3/uL (ref 150–379)
RBC: 4.63 x10E6/uL (ref 4.14–5.80)
RDW: 13.5 % (ref 12.3–15.4)
WBC: 7 10*3/uL (ref 3.4–10.8)

## 2017-03-08 LAB — BASIC METABOLIC PANEL
BUN/Creatinine Ratio: 9 (ref 9–20)
BUN: 9 mg/dL (ref 6–20)
CALCIUM: 9.4 mg/dL (ref 8.7–10.2)
CHLORIDE: 106 mmol/L (ref 96–106)
CO2: 24 mmol/L (ref 20–29)
Creatinine, Ser: 0.99 mg/dL (ref 0.76–1.27)
GFR calc Af Amer: 121 mL/min/{1.73_m2} (ref >59)
GFR calc non Af Amer: 105 mL/min/{1.73_m2} (ref >59)
Glucose: 109 mg/dL — ABNORMAL HIGH (ref 65–99)
Potassium: 4 mmol/L (ref 3.5–5.2)
Sodium: 144 mmol/L (ref 134–144)

## 2017-03-08 LAB — HIV ANTIBODY (ROUTINE TESTING W REFLEX): HIV SCREEN 4TH GENERATION: NONREACTIVE

## 2017-03-08 LAB — RPR: RPR Ser Ql: NONREACTIVE

## 2017-03-08 LAB — URINE CYTOLOGY ANCILLARY ONLY
Chlamydia: NEGATIVE
Neisseria Gonorrhea: NEGATIVE

## 2017-03-13 DIAGNOSIS — Z720 Tobacco use: Secondary | ICD-10-CM | POA: Insufficient documentation

## 2017-03-13 NOTE — Assessment & Plan Note (Signed)
Patient having "fur ball" feeling in chest after eating. Could be from GERD as he has a history of this. Start Omeprazole 20 mg daily. If no improvement after a 4 weeks, would advise to return to GI
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
## 2017-05-04 NOTE — Progress Notes (Signed)
   [facility] Phone: [phone]   Date of Visit: 05/05/2017   HPI:  Chest Cold:  - reports of chest cold x 2 weeks - earlier in the illness he did have 2 days of chills. Also had a productive cough. No shortness of breath. No fevers. No nausea, vomiting, diarrhea, or abdominal pain. No ear discomfort  - reports he felt like his right cervical lymph node has been slightly swollen  - for the past 2-3 days, his allergies have been acting up. He had a sore throat last night but not today.  - mucinex and DayQuil help a little  - his symptoms are improving. The cough is lingering  - no sick contacts - current tobacco use  ROS: See HPI.  [place]:  PMH: GERD Tobacco Use Elevated BP Bipolar DO  Asthma PHYSICAL EXAM: BP 124/70   Pulse 70   Temp 98.2 F (36.8 C) (Oral)   Ht 5\' 11"  (1.803 m)   Wt 220 lb (99.8 kg)   SpO2 97%   BMI 30.68 kg/m  GEN: NAD, nontoxic HEENT: Atraumatic, normocephalic, neck supple without lymphadenopathy, EOMI, sclera clear, oropharynx normal, tympanic membranes normal bilaterally. No tenderness to percussion of sinuses CV: RRR, no murmurs, rubs, or gallops PULM: CTAB, normal effort SKIN: No rash or cyanosis; warm and well-perfused EXTR: No lower extremity edema or calf tenderness PSYCH: Mood and affect euthymic, normal rate and volume of speech NEURO: Awake, alert, no focal deficits grossly, normal speech   ASSESSMENT/PLAN:  Post-viral cough syndrome Symptoms are most consistent with post viral cough syndrome. Afebrile and vitals stable. Well appearing on exam. Unlikely PNA. Additionally his symptoms are improving which would not support a bacterial etiology for his symptoms. May have allergy component. Restart Zyrtec and Flonase. Return precautions discussed   Smiley Houseman, MD PGY [place]

## 2017-05-05 ENCOUNTER — Other Ambulatory Visit: Payer: Self-pay

## 2017-05-05 ENCOUNTER — Encounter: Payer: Self-pay | Admitting: Internal Medicine

## 2017-05-05 ENCOUNTER — Ambulatory Visit: Payer: Medicaid Other | Admitting: Internal Medicine

## 2017-05-05 VITALS — BP 124/70 | HR 70 | Temp 98.2°F | Ht 71.0 in | Wt 220.0 lb

## 2017-05-05 DIAGNOSIS — R05 Cough: Secondary | ICD-10-CM | POA: Diagnosis present

## 2017-05-05 DIAGNOSIS — R058 Other specified cough: Secondary | ICD-10-CM

## 2017-05-05 MED ORDER — FLUTICASONE PROPIONATE 50 MCG/ACT NA SUSP: 2.0000 | Freq: Every day | NASAL | 5 refills | Status: DC

## 2017-05-05 MED ORDER — CETIRIZINE HCL 10 MG PO TABS: 10.0000 mg | ORAL_TABLET | Freq: Every day | ORAL | 5 refills | Status: DC

## 2017-05-05 MED ORDER — OLOPATADINE HCL 0.1 % OP SOLN: 1.0000 [drp] | Freq: Two times a day (BID) | OPHTHALMIC | 0 refills | Status: DC

## 2017-05-05 NOTE — Patient Instructions (Addendum)
Your symptoms are likely due to a virus. This will get better in the next few weeks. Please return to clinic if you have fevers, worsening cough, or shortness of breath.

## 2017-05-08 ENCOUNTER — Encounter: Payer: Self-pay | Admitting: Internal Medicine

## 2017-06-01 ENCOUNTER — Encounter (HOSPITAL_COMMUNITY): Payer: Self-pay | Admitting: Emergency Medicine

## 2017-06-01 ENCOUNTER — Other Ambulatory Visit: Payer: Self-pay

## 2017-06-01 ENCOUNTER — Emergency Department (HOSPITAL_COMMUNITY)
Admission: EM | Admit: 2017-06-01 | Discharge: 2017-06-01 | Disposition: A | Discharge status: Home / Self Care | Payer: Medicaid Other | Attending: Emergency Medicine | Admitting: Emergency Medicine

## 2017-06-01 DIAGNOSIS — F909 Attention-deficit hyperactivity disorder, unspecified type: Secondary | ICD-10-CM | POA: Insufficient documentation

## 2017-06-01 DIAGNOSIS — J302 Other seasonal allergic rhinitis: Secondary | ICD-10-CM | POA: Insufficient documentation

## 2017-06-01 DIAGNOSIS — Z79899 Other long term (current) drug therapy: Secondary | ICD-10-CM | POA: Insufficient documentation

## 2017-06-01 DIAGNOSIS — Z7982 Long term (current) use of aspirin: Secondary | ICD-10-CM | POA: Insufficient documentation

## 2017-06-01 DIAGNOSIS — J45909 Unspecified asthma, uncomplicated: Secondary | ICD-10-CM | POA: Insufficient documentation

## 2017-06-01 DIAGNOSIS — F17210 Nicotine dependence, cigarettes, uncomplicated: Secondary | ICD-10-CM | POA: Diagnosis not present

## 2017-06-01 DIAGNOSIS — Z041 Encounter for examination and observation following transport accident: Secondary | ICD-10-CM | POA: Diagnosis present

## 2017-06-01 MED ORDER — NAPROXEN 500 MG PO TABS: 500.0000 mg | ORAL_TABLET | Freq: Two times a day (BID) | ORAL | 0 refills | Status: DC

## 2017-06-01 MED ORDER — METHOCARBAMOL 500 MG PO TABS: 500.0000 mg | ORAL_TABLET | Freq: Three times a day (TID) | ORAL | 0 refills | Status: DC | PRN
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
## 2017-06-08 ENCOUNTER — Other Ambulatory Visit: Payer: Self-pay

## 2017-06-08 ENCOUNTER — Ambulatory Visit (INDEPENDENT_AMBULATORY_CARE_PROVIDER_SITE_OTHER): Payer: Medicaid Other | Admitting: Family Medicine

## 2017-06-08 ENCOUNTER — Encounter: Payer: Self-pay | Admitting: Family Medicine

## 2017-06-08 VITALS — BP 130/80 | HR 59 | Temp 98.2°F | Ht 72.0 in | Wt 225.2 lb

## 2017-06-08 DIAGNOSIS — S39012A Strain of muscle, fascia and tendon of lower back, initial encounter: Secondary | ICD-10-CM | POA: Diagnosis not present

## 2017-06-08 MED ORDER — IBUPROFEN 600 MG PO TABS: 600.0000 mg | ORAL_TABLET | Freq: Three times a day (TID) | ORAL | 0 refills | Status: DC | PRN

## 2017-06-08 MED ORDER — CYCLOBENZAPRINE HCL 5 MG PO TABS: 5.0000 mg | ORAL_TABLET | Freq: Three times a day (TID) | ORAL | 0 refills | Status: DC | PRN
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
## 2017-06-08 NOTE — Patient Instructions (Signed)
Thank you for coming in today, it was so nice to see you! Today we talked about:    Neck and back pain: We have given you prescription for muscle relaxer as well as ibuprofen.  Take the dose prescribed.  Please follow up in 2 week if you continue to have pain. You can schedule this appointment at the front desk before you leave or call the clinic.  Bring in all your medications or supplements to each appointment for review.   If we ordered any tests today, you will be notified via telephone of any abnormalities. If everything is normal you will get a letter in the mail.   If you have any questions or concerns, please do not hesitate to call the office at [phone]. You can also message me directly via MyChart.   Sincerely,  Smitty Cords, MD
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
## 2017-07-12 ENCOUNTER — Other Ambulatory Visit: Payer: Self-pay

## 2017-07-12 ENCOUNTER — Emergency Department (HOSPITAL_COMMUNITY)
Admission: EM | Admit: 2017-07-12 | Discharge: 2017-07-12 | Disposition: A | Discharge status: Home / Self Care | Payer: Medicaid Other | Attending: Emergency Medicine | Admitting: Emergency Medicine

## 2017-07-12 ENCOUNTER — Encounter (HOSPITAL_COMMUNITY): Payer: Self-pay

## 2017-07-12 DIAGNOSIS — J45909 Unspecified asthma, uncomplicated: Secondary | ICD-10-CM | POA: Diagnosis not present

## 2017-07-12 DIAGNOSIS — F909 Attention-deficit hyperactivity disorder, unspecified type: Secondary | ICD-10-CM | POA: Insufficient documentation

## 2017-07-12 DIAGNOSIS — F17210 Nicotine dependence, cigarettes, uncomplicated: Secondary | ICD-10-CM | POA: Diagnosis not present

## 2017-07-12 DIAGNOSIS — Z79899 Other long term (current) drug therapy: Secondary | ICD-10-CM | POA: Insufficient documentation

## 2017-07-12 DIAGNOSIS — J302 Other seasonal allergic rhinitis: Secondary | ICD-10-CM | POA: Insufficient documentation

## 2017-07-12 DIAGNOSIS — R51 Headache: Secondary | ICD-10-CM | POA: Insufficient documentation

## 2017-07-12 DIAGNOSIS — I10 Essential (primary) hypertension: Secondary | ICD-10-CM | POA: Diagnosis not present

## 2017-07-12 DIAGNOSIS — Z7982 Long term (current) use of aspirin: Secondary | ICD-10-CM | POA: Insufficient documentation

## 2017-07-12 DIAGNOSIS — R519 Headache, unspecified: Secondary | ICD-10-CM

## 2017-07-12 HISTORY — DX: Essential (primary) hypertension: I10

## 2017-07-12 MED ORDER — PROCHLORPERAZINE EDISYLATE 10 MG/2ML IJ SOLN
10.0000 mg | Freq: Once | INTRAMUSCULAR | Status: AC
  Administered 2017-07-12: 10 mg via INTRAVENOUS
  Filled 2017-07-12: qty 2

## 2017-07-12 MED ORDER — SODIUM CHLORIDE 0.9 % IV BOLUS
500.0000 mL | Freq: Once | INTRAVENOUS | Status: AC
  Administered 2017-07-12: 500 mL via INTRAVENOUS

## 2017-07-12 MED ORDER — DIPHENHYDRAMINE HCL 50 MG/ML IJ SOLN
12.5000 mg | Freq: Once | INTRAMUSCULAR | Status: AC
  Administered 2017-07-12: 12.5 mg via INTRAVENOUS
  Filled 2017-07-12: qty 1

## 2017-07-12 MED ORDER — KETOROLAC TROMETHAMINE 30 MG/ML IJ SOLN
30.0000 mg | Freq: Once | INTRAMUSCULAR | Status: AC
  Administered 2017-07-12: 30 mg via INTRAVENOUS
  Filled 2017-07-12: qty 1

## 2017-07-12 NOTE — ED Notes (Signed)
Patient Alert and oriented to baseline. Stable and ambulatory to baseline. Patient verbalized understanding of the discharge instructions.  Patient belongings were taken by the patient.   

## 2017-07-12 NOTE — Discharge Instructions (Signed)
It was my pleasure taking care of you today!   Please follow up with your primary care doctor in a week or two for blood pressure recheck.   Return to ER for new or worsening symptoms, any additional concerns.

## 2017-07-12 NOTE — ED Provider Notes (Signed)
[facility] EMERGENCY DEPARTMENT Provider Note   CSN: [id number] Arrival date & time: 07/12/17  2012     History   Chief Complaint Chief Complaint  Patient presents with  . Hypertension    HPI Stephen Frank is a 27 y.o. male.  The history is provided by the patient and medical records. No language interpreter was used.  Hypertension  Associated symptoms include headaches.   Stephen Frank is a 27 y.o. male who presents to the Emergency Department complaining of concerns for elevated blood pressure and headache.  Patient states that he does have a history of migraines.  He awoke this morning and had a right-sided headache which was associated with photophobia.  Felt similar to his previous headaches.  He took his blood pressure and noted that it was elevated 150s over 90s.  He waited about an hour then checked his blood pressure again and it was noted to be 160/110's.  He was concerned that his headache may be due to his blood pressure, therefore came to the emergency department for further evaluation.  No medications taken prior to arrival for his symptoms.  No fever, chills, neck pain, visual changes, speech/gait abnormalities, numbness, tingling, weakness.  Patient reports history of hypertension.  He was started on blood pressure medication at a very low dose for several months, then his primary doctor took him off of the medication as he was doing well with his dietary changes and exercise regimen.    Past Medical History:  Diagnosis Date  . ADHD (attention deficit hyperactivity disorder)   . Allergy   . Asthma   . Bipolar disorder ([place])   . GERD (gastroesophageal reflux disease) 04/25/2012  . H/O chlamydia infection   . History of seasonal allergies   . Hypertension     Patient Active Problem List   Diagnosis Date Noted  . Tobacco abuse 03/13/2017  . Strain of lumbar paraspinal muscle 09/08/2016  . Left knee pain 01/12/2016  . Rash and nonspecific  skin eruption 02/24/2015  . Lichen simplex, chronic 11/06/2014  . Elevated BP 09/20/2014  . Dyspepsia 08/12/2014  . 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 Gynecomastia 08/13/2013  . Risky sexual behavior 12/24/2012  . GERD (gastroesophageal reflux disease) 04/25/2012  . Bipolar disorder ([place]) 12/14/2011    Past Surgical History:  Procedure Laterality Date  . ESOPHAGOGASTRODUODENOSCOPY    . TONSILLECTOMY AND ADENOIDECTOMY          Home Medications    Prior to Admission medications   Medication Sig Start Date End Date Taking? Authorizing Provider  aspirin-acetaminophen-caffeine (EXCEDRIN MIGRAINE) [phone] MG per tablet Take 2 tablets by mouth every 6 (six) hours as needed for headache.   Yes [provider]  cetirizine (ZYRTEC) 10 MG tablet Take 1 tablet (10 mg total) by mouth daily. 05/05/17  Yes Smiley Houseman, MD  cyclobenzaprine (FLEXERIL) 5 MG tablet Take 1 tablet (5 mg total) by mouth 3 (three) times daily as needed for muscle spasms. 06/08/17  Yes Carlyle Dolly, MD  fluticasone (FLONASE) 50 MCG/ACT nasal spray Place 2 sprays into both nostrils daily. 05/05/17  Yes Smiley Houseman, MD  ibuprofen (ADVIL,MOTRIN) 600 MG tablet Take 1 tablet (600 mg total) by mouth every 8 (eight) hours as needed. 06/08/17  Yes Carlyle Dolly, MD  olopatadine (PATANOL) 0.1 % ophthalmic solution Place 1 drop into both eyes 2 (two) times daily. 05/05/17  Yes Smiley Houseman, MD  omeprazole (PRILOSEC) 20 MG capsule Take  1 capsule (20 mg total) by mouth daily. 03/07/17  Yes Carlyle Dolly, MD    Family History Family History  Problem Relation Age of Onset  . Diabetes Mother   . Hypertension Mother   . Colon cancer Maternal Grandfather   . Hyperlipidemia Maternal Grandfather   . Diabetes Maternal Grandfather   . Diabetes Maternal Grandmother   . Esophageal cancer Neg Hx   . Rectal cancer Neg Hx   . Stomach cancer Neg Hx   . Prostate cancer Neg Hx   .  Pancreatic cancer Neg Hx     Social History Social History   Tobacco Use  . Smoking status: Current Every Day Smoker    Packs/day: 1.00    Types: Cigarettes    Start date: 08/25/2014  . Smokeless tobacco: Never Used  Substance Use Topics  . Alcohol use: No    Alcohol/week: 0.0 oz  . Drug use: No    Comment: twice  to three times per week.  Last smoked 10-19-2013     Allergies   Bee venom and Soy allergy   Review of Systems Review of Systems  Eyes: Positive for photophobia. Negative for visual disturbance.  Neurological: Positive for headaches. Negative for dizziness and weakness.  All other systems reviewed and are negative.    Physical Exam Updated Vital Signs BP (!) 147/102   Pulse 74   Temp (!) 97.4 F (36.3 C) (Oral)   Resp 16   Ht 5\' 11"  (1.803 m)   Wt 102.1 kg (225 lb)   SpO2 98%   BMI 31.38 kg/m   Physical Exam  Constitutional: He is oriented to person, place, and time. He appears well-developed and well-nourished. No distress.  HENT:  Head: Normocephalic and atraumatic.  Mouth/Throat: Oropharynx is clear and moist.  No tenderness of the temporal artery   Eyes: Pupils are equal, round, and reactive to light. Conjunctivae and EOM are normal. No scleral icterus.  No nystagmus   Neck: Normal range of motion. Neck supple.  Full active and passive ROM without pain.  No midline or paraspinal tenderness. No nuchal rigidity or meningeal signs.  Cardiovascular: Normal rate, regular rhythm, normal heart sounds and intact distal pulses.  Pulmonary/Chest: Effort normal and breath sounds normal. No respiratory distress. He has no wheezes. He has no rales.  Abdominal: Soft. Bowel sounds are normal. He exhibits no distension. There is no tenderness. There is no rebound and no guarding.  Musculoskeletal: Normal range of motion.  Lymphadenopathy:    He has no cervical adenopathy.  Neurological: He is alert and oriented to person, place, and time. He has normal  reflexes. No cranial nerve deficit. Coordination normal.  Alert, oriented, thought content appropriate, able to give a coherent history. Speech is clear and goal oriented, able to follow commands.  Cranial Nerves:  II:  Peripheral visual fields grossly normal, pupils equal, round, reactive to light III, IV, VI: EOM intact bilaterally, ptosis not present V,VII: smile symmetric, eyes kept closed tightly against resistance, facial light touch sensation equal VIII: hearing grossly normal IX, X: symmetric soft palate movement, uvula elevates symmetrically  XI: bilateral shoulder shrug symmetric and strong XII: midline tongue extension 5/5 muscle strength in upper and lower extremities bilaterally including strong and equal grip strength and dorsiflexion/plantar flexion Sensory to light touch normal in all four extremities.  Normal finger-to-nose and rapid alternating movements. No drift. Steady gait.  Skin: Skin is warm and dry. No rash noted. He is not diaphoretic.  Nursing  note and vitals reviewed.    ED Treatments / Results  Labs (all labs ordered are listed, but only abnormal results are displayed) Labs Reviewed - No data to display  EKG None  Radiology No results found.  Procedures Procedures (including critical care time)  Medications Ordered in ED Medications  sodium chloride 0.9 % bolus 500 mL (0 mLs Intravenous Stopped 07/12/17 2154)  ketorolac (TORADOL) 30 MG/ML injection 30 mg (30 mg Intravenous Given 07/12/17 2121)  prochlorperazine (COMPAZINE) injection 10 mg (10 mg Intravenous Given 07/12/17 2121)  diphenhydrAMINE (BENADRYL) injection 12.5 mg (12.5 mg Intravenous Given 07/12/17 2120)     Initial Impression / Assessment and Plan / ED Course  I have reviewed the triage vital signs and the nursing notes.  Pertinent labs & imaging results that were available during my care of the patient were reviewed by me and considered in my medical decision making (see chart for  details).    Stephen Frank is a 27 y.o. male who presents to ED for headache c/w their typical migraine. Patient was concerned for elevated blood pressure readings at home.  Blood pressure initially of 158/97 in the emergency department.  Recheck when he made it to exam room of 142/96. No focal neuro deficits on exam. Migraine cocktail and fluids given.   On re-evaluation, patient feels improved and headache has now resolved. The patient denies any neurologic symptoms such as visual changes, focal numbness/weakness, balance problems, confusion, or speech difficulty to suggest a life-threatening intracranial process such as intracranial hemorrhage or mass. The patient has no clotting risk factors thus venous sinus thrombosis is unlikely. No fevers, neck pain or nuchal rigidity to suggest meningitis. I feel that the patient is safe for discharge home at this time. PCP follow up strongly encouraged for blood pressure recheck this week. I have reviewed return precautions including development of neurologic symptoms, confusion, lethargy, difficulty speaking, or new/worsening/concerning symptoms. All questions answered.  Final Clinical Impressions(s) / ED Diagnoses   Final diagnoses:  Bad headache    ED Discharge Orders    None       Ward, [name], PA-C 07/12/17 2235    Carmin Muskrat, MD 07/13/17 1351

## 2017-07-12 NOTE — ED Triage Notes (Signed)
Pt endorses headache since this am, checked his blood pressure this afternoon and it was 160/113. Has hx of htn and took medication for it at one time, but got it under control and was taken off. No CP or shob.

## 2017-07-19 ENCOUNTER — Emergency Department (HOSPITAL_COMMUNITY)
Admission: EM | Admit: 2017-07-19 | Discharge: 2017-07-20 | Disposition: A | Discharge status: Home / Self Care | Payer: Medicaid Other | Attending: Emergency Medicine | Admitting: Emergency Medicine

## 2017-07-19 ENCOUNTER — Emergency Department (HOSPITAL_COMMUNITY): Payer: Medicaid Other

## 2017-07-19 ENCOUNTER — Encounter (HOSPITAL_COMMUNITY): Payer: Self-pay | Admitting: Emergency Medicine

## 2017-07-19 ENCOUNTER — Other Ambulatory Visit: Payer: Self-pay

## 2017-07-19 DIAGNOSIS — F909 Attention-deficit hyperactivity disorder, unspecified type: Secondary | ICD-10-CM | POA: Insufficient documentation

## 2017-07-19 DIAGNOSIS — F17210 Nicotine dependence, cigarettes, uncomplicated: Secondary | ICD-10-CM | POA: Insufficient documentation

## 2017-07-19 DIAGNOSIS — R072 Precordial pain: Secondary | ICD-10-CM | POA: Insufficient documentation

## 2017-07-19 DIAGNOSIS — R51 Headache: Secondary | ICD-10-CM | POA: Diagnosis present

## 2017-07-19 DIAGNOSIS — J45909 Unspecified asthma, uncomplicated: Secondary | ICD-10-CM | POA: Diagnosis not present

## 2017-07-19 DIAGNOSIS — G4489 Other headache syndrome: Secondary | ICD-10-CM | POA: Insufficient documentation

## 2017-07-19 LAB — BASIC METABOLIC PANEL
Anion gap: 8 (ref 5–15)
BUN: 12 mg/dL (ref 6–20)
CHLORIDE: 107 mmol/L (ref 98–111)
CO2: 26 mmol/L (ref 22–32)
Calcium: 9.3 mg/dL (ref 8.9–10.3)
Creatinine, Ser: 1.05 mg/dL (ref 0.61–1.24)
GFR calc Af Amer: >60 mL/min (ref >60)
GFR calc non Af Amer: >60 mL/min (ref >60)
Glucose, Bld: 131 mg/dL — ABNORMAL HIGH (ref 70–99)
Potassium: 3.8 mmol/L (ref 3.5–5.1)
Sodium: 141 mmol/L (ref 135–145)

## 2017-07-19 LAB — CBC
HCT: 41.3 % (ref 39.0–52.0)
Hemoglobin: 13.7 g/dL (ref 13.0–17.0)
MCH: 28.9 pg (ref 26.0–34.0)
MCHC: 33.2 g/dL (ref 30.0–36.0)
MCV: 87.1 fL (ref 78.0–100.0)
Platelets: 227 10*3/uL (ref 150–400)
RBC: 4.74 MIL/uL (ref 4.22–5.81)
RDW: 13 % (ref 11.5–15.5)
WBC: 9.5 10*3/uL (ref 4.0–10.5)

## 2017-07-19 LAB — I-STAT TROPONIN, ED: Troponin i, poc: 0 ng/mL (ref 0.00–0.08)

## 2017-07-19 NOTE — ED Triage Notes (Signed)
Patient with sharp pain in his head, states that the pain made him drop to his knees.  He states that he is having chest pain and shortness of breath with the sharp pain.  He states he thinks that he may have had a stroke because of the sharp pain in his head.  He did vomit once and some mild nausea at this time.

## 2017-07-20 ENCOUNTER — Ambulatory Visit: Payer: Medicaid Other | Admitting: Internal Medicine

## 2017-07-20 MED ORDER — IBUPROFEN 400 MG PO TABS: 400.0000 mg | ORAL_TABLET | Freq: Three times a day (TID) | ORAL | 0 refills | Status: DC | PRN

## 2017-07-20 MED ORDER — IBUPROFEN 400 MG PO TABS
400.0000 mg | ORAL_TABLET | Freq: Once | ORAL | Status: AC
  Administered 2017-07-20: 400 mg via ORAL
  Filled 2017-07-20: qty 1

## 2017-07-20 NOTE — ED Provider Notes (Signed)
[facility] EMERGENCY DEPARTMENT Provider Note   CSN: [id number] Arrival date & time: 07/19/17  2221     History   Chief Complaint Chief Complaint  Patient presents with  . Chest Pain  . Headache    HPI Stephen Frank is a 27 y.o. male.  The history is provided by the patient.  Headache   This is a new problem. The current episode started 6 to 12 hours ago. The problem occurs constantly. The problem has been gradually improving. The headache is associated with emotional stress. The pain is located in the bilateral region. Quality: Burning. The pain is at a severity of 8/10. The pain radiates to the face. Associated symptoms include nausea. Pertinent negatives include no fever and no syncope.   Patient with history of asthma, history of hypertension presents with multiple complaints.  He reports while watching TV earlier today, he had onset of burning pain that started in both ears and went down to his face and up into his head.  No fevers.  No visual changes.  No hearing changes.  No chest pain or shortness of breath.  No focal weakness.  No syncope. He also mentions brief episodes of left upper chest/shoulder pain. The pain was gradual in onset and began below both ears Past Medical History:  Diagnosis Date  . ADHD (attention deficit hyperactivity disorder)   . Allergy   . Asthma   . Bipolar disorder ([place])   . GERD (gastroesophageal reflux disease) 04/25/2012  . H/O chlamydia infection   . History of seasonal allergies   . Hypertension     Patient Active Problem List   Diagnosis Date Noted  . Tobacco abuse 03/13/2017  . Strain of lumbar paraspinal muscle 09/08/2016  . Left knee pain 01/12/2016  . Rash and nonspecific skin eruption 02/24/2015  . Lichen simplex, chronic 11/06/2014  . Elevated BP 09/20/2014  . Dyspepsia 08/12/2014  . 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 Gynecomastia 08/13/2013  . Risky sexual behavior 12/24/2012  . GERD  (gastroesophageal reflux disease) 04/25/2012  . Bipolar disorder ([place]) 12/14/2011    Past Surgical History:  Procedure Laterality Date  . ESOPHAGOGASTRODUODENOSCOPY    . TONSILLECTOMY AND ADENOIDECTOMY          Home Medications    Prior to Admission medications   Medication Sig Start Date End Date Taking? Authorizing Provider  aspirin-acetaminophen-caffeine (EXCEDRIN MIGRAINE) [phone] MG per tablet Take 2 tablets by mouth every 6 (six) hours as needed for headache.    [provider]  cetirizine (ZYRTEC) 10 MG tablet Take 1 tablet (10 mg total) by mouth daily. 05/05/17   Smiley Houseman, MD  cyclobenzaprine (FLEXERIL) 5 MG tablet Take 1 tablet (5 mg total) by mouth 3 (three) times daily as needed for muscle spasms. 06/08/17   Carlyle Dolly, MD  fluticasone (FLONASE) 50 MCG/ACT nasal spray Place 2 sprays into both nostrils daily. 05/05/17   Smiley Houseman, MD  ibuprofen (ADVIL,MOTRIN) 400 MG tablet Take 1 tablet (400 mg total) by mouth every 8 (eight) hours as needed for moderate pain. 07/20/17   Ripley Fraise, MD  olopatadine (PATANOL) 0.1 % ophthalmic solution Place 1 drop into both eyes 2 (two) times daily. 05/05/17   Smiley Houseman, MD  omeprazole (PRILOSEC) 20 MG capsule Take 1 capsule (20 mg total) by mouth daily. 03/07/17   Carlyle Dolly, MD    Family History Family History  Problem Relation Age of Onset  . Diabetes Mother   .  Hypertension Mother   . Colon cancer Maternal Grandfather   . Hyperlipidemia Maternal Grandfather   . Diabetes Maternal Grandfather   . Diabetes Maternal Grandmother   . Esophageal cancer Neg Hx   . Rectal cancer Neg Hx   . Stomach cancer Neg Hx   . Prostate cancer Neg Hx   . Pancreatic cancer Neg Hx     Social History Social History   Tobacco Use  . Smoking status: Current Every Day Smoker    Packs/day: 1.00    Types: Cigarettes    Start date: 08/25/2014  . Smokeless tobacco: Never Used  Substance  Use Topics  . Alcohol use: No    Alcohol/week: 0.0 oz  . Drug use: No    Comment: twice  to three times per week.  Last smoked 10-19-2013     Allergies   Bee venom and Soy allergy   Review of Systems Review of Systems  Constitutional: Negative for fever.  HENT: Negative for hearing loss.   Eyes: Negative for visual disturbance.  Cardiovascular: Positive for chest pain. Negative for syncope.  Gastrointestinal: Positive for nausea.  Neurological: Positive for headaches. Negative for syncope and weakness.  All other systems reviewed and are negative.    Physical Exam Updated Vital Signs BP 136/85   Pulse 66   Temp 98.3 F (36.8 C) (Oral)   Resp 16   SpO2 99%   Physical Exam CONSTITUTIONAL: Well developed/well nourished HEAD: Normocephalic/atraumatic EYES: EOMI/PERRL, no nystagmus, no ptosis ENMT: Mucous membranes moist, bilateral TMs occluded by cerumen, no facial rash or swelling NECK: supple no meningeal signs, no bruits CV: S1/S2 noted, no murmurs/rubs/gallops noted LUNGS: Lungs are clear to auscultation bilaterally, no apparent distress ABDOMEN: soft, nontender, no rebound or guarding GU:no cva tenderness NEURO:Awake/alert, face symmetric, no arm or leg drift is noted Equal 5/5 strength with shoulder abduction, elbow flex/extension, wrist flex/extension in upper extremities and equal hand grips bilaterally Equal 5/5 strength with hip flexion,knee flex/extension, foot dorsi/plantar flexion Cranial nerves 3/4/5/6/08/01/08/11/12 tested and intact Gait normal without ataxia No past pointing Sensation to light touch intact in all extremities EXTREMITIES: pulses normal, full ROM SKIN: warm, color normal PSYCH: no abnormalities of mood noted   ED Treatments / Results  Labs (all labs ordered are listed, but only abnormal results are displayed) Labs Reviewed  BASIC METABOLIC PANEL - Abnormal; Notable for the following components:      Result Value   Glucose, Bld 131  (*)    All other components within normal limits  CBC  I-STAT TROPONIN, ED    EKG EKG Interpretation  Date/Time:  Wednesday July 19 2017 22:28:57 EDT Ventricular Rate:  78 PR Interval:  136 QRS Duration: 84 QT Interval:  366 QTC Calculation: 417 R Axis:   70 Text Interpretation:  Normal sinus rhythm Normal ECG No previous ECGs available Confirmed by Ripley Fraise [phone]) on 07/20/2017 1:02:47 AM   Radiology Dg Chest 2 View  Result Date: 07/19/2017 CLINICAL DATA:  Pain on both sides of the face to the years. Chest pain and dyspnea associated with sharp pain. EXAM: CHEST - 2 VIEW COMPARISON:  10/17/2010 CXR FINDINGS: The heart size and mediastinal contours are within normal limits. Both lungs are clear. The visualized skeletal structures are unremarkable. IMPRESSION: No active cardiopulmonary disease. Electronically Signed   By: Ashley Royalty M.D.   On: 07/19/2017 23:35    Procedures Procedures   Medications Ordered in ED Medications  ibuprofen (ADVIL,MOTRIN) tablet 400 mg (400 mg Oral Given  07/20/17 0139)     Initial Impression / Assessment and Plan / ED Course  I have reviewed the triage vital signs and the nursing notes.  Pertinent labs & imaging results that were available during my care of the patient were reviewed by me and considered in my medical decision making (see chart for details).     Very well-appearing.  No signs of stroke. He is ambulatory without difficulty.  He suspected he may have trigeminal neuralgia, this is possible. After discussion, he reports he feels improved and would like to be discharged.  Do not feel neuroimaging is required, he can follow-up as an outpatient if this continues Final Clinical Impressions(s) / ED Diagnoses   Final diagnoses:  Precordial pain  Other headache syndrome    ED Discharge Orders        Ordered    ibuprofen (ADVIL,MOTRIN) 400 MG tablet  Every 8 hours PRN     07/20/17 0129       Ripley Fraise,  MD 07/20/17 [phone]

## 2017-07-20 NOTE — ED Notes (Addendum)
Pt reports a sharp pain in his head with a tingling sensation in his ears. Pt also reports tingling pain in his left shoulder. Pt reports this all started at 1600 yesterday afternoon.

## 2017-07-20 NOTE — Discharge Instructions (Addendum)

## 2017-12-06 ENCOUNTER — Other Ambulatory Visit (HOSPITAL_COMMUNITY)
Admission: RE | Admit: 2017-12-06 | Discharge: 2017-12-06 | Disposition: A | Discharge status: Home / Self Care | Payer: BLUE CROSS/BLUE SHIELD | Source: Ambulatory Visit | Attending: Family Medicine | Admitting: Family Medicine

## 2017-12-06 ENCOUNTER — Ambulatory Visit (INDEPENDENT_AMBULATORY_CARE_PROVIDER_SITE_OTHER): Payer: Medicaid Other | Admitting: Family Medicine

## 2017-12-06 ENCOUNTER — Encounter: Payer: Self-pay | Admitting: Family Medicine

## 2017-12-06 ENCOUNTER — Other Ambulatory Visit: Payer: Self-pay

## 2017-12-06 VITALS — BP 114/82 | HR 76 | Temp 98.4°F | Wt 234.2 lb

## 2017-12-06 DIAGNOSIS — Z113 Encounter for screening for infections with a predominantly sexual mode of transmission: Secondary | ICD-10-CM | POA: Diagnosis not present

## 2017-12-06 DIAGNOSIS — Z0000 Encounter for general adult medical examination without abnormal findings: Secondary | ICD-10-CM

## 2017-12-06 DIAGNOSIS — W57XXXA Bitten or stung by nonvenomous insect and other nonvenomous arthropods, initial encounter: Secondary | ICD-10-CM

## 2017-12-06 DIAGNOSIS — Z91030 Bee allergy status: Secondary | ICD-10-CM

## 2017-12-06 DIAGNOSIS — S80861A Insect bite (nonvenomous), right lower leg, initial encounter: Secondary | ICD-10-CM

## 2017-12-06 DIAGNOSIS — J302 Other seasonal allergic rhinitis: Secondary | ICD-10-CM

## 2017-12-06 DIAGNOSIS — K219 Gastro-esophageal reflux disease without esophagitis: Secondary | ICD-10-CM

## 2017-12-06 MED ORDER — OMEPRAZOLE 20 MG PO CPDR: 20.0000 mg | DELAYED_RELEASE_CAPSULE | Freq: Every day | ORAL | 3 refills | Status: DC

## 2017-12-06 MED ORDER — FLUTICASONE PROPIONATE 50 MCG/ACT NA SUSP: 2.0000 | Freq: Every day | NASAL | 5 refills | Status: DC

## 2017-12-06 MED ORDER — CETIRIZINE HCL 10 MG PO TABS: 10.0000 mg | ORAL_TABLET | Freq: Every day | ORAL | 5 refills | Status: DC

## 2017-12-06 MED ORDER — HYDROCORTISONE 1 % EX OINT: 1.0000 "application " | TOPICAL_OINTMENT | Freq: Every day | CUTANEOUS | 0 refills | Status: AC

## 2017-12-06 MED ORDER — EPINEPHRINE 0.3 MG/0.3ML IJ SOAJ
0.3000 mg | Freq: Once | INTRAMUSCULAR | 1 refills | Status: AC
Start: 2017-12-06 — End: 2017-12-06

## 2017-12-06 NOTE — Progress Notes (Signed)
Patient Name: Stephen Frank Date of Birth: 06/10/1990 Date of Visit: 12/06/17 PCP: Martyn Malay, MD  Chief Complaint: STI testing, other questions   Subjective: Stephen Frank is a pleasant 27 y.o. year old gentleman with history of bipolar disorder presenting today for routine follow-up and evaluation.  He reports overall he is doing well.  He was recently released from jail.  He reports he would like testing for sexually transmitted infections.  He is currently living with his mom and his girlfriend.  He requests testing for sexually transmitted infections today.  He reports he has had 6 sexual partners in the last year.  He has intercourse with only women.  He does not regularly use condoms.  He does not have anal intercourse.  He is not interested in PREP today.  He denies signs or symptoms of infection.  The patient reports ongoing marijuana use.  The patient reports he has a B allergy.  He has had a severe allergy to this in the past.  He recently got stung by bee in his house.  He has had some itching and irritation over the site.  No fevers chills or progression of the erythema around the area  ROS:  ROS Denies headaches, vision changes, chest pain, dyspnea on exertion, chronic cough.  Denies weight loss, urethral discharge or  I have reviewed the patient's medical, surgical, family, and social history as appropriate.   Vitals:   12/06/17 0942  BP: 114/82  Pulse: 76  Temp: 98.4 F (36.9 C)  SpO2: 98%   Filed Weights   12/06/17 0942  Weight: 234 lb 3.2 oz (106.2 kg)   HEENT: Sclera anicteric. Dentition is moderate. Appears well hydrated. Neck: Supple Cardiac: Regular rate and rhythm. Normal S1/S2. No murmurs, rubs, or gallops appreciated. Lungs: Clear bilaterally to ascultation.  Abdomen: Normoactive bowel sounds. No tenderness to deep or light palpation. No rebound or guarding.  Extremities: Warm, well perfused without edema.  Skin: Warm, over the right posterior  calf area there is 3.5 cm area of erythema no induration there is a small papule in the center.  There is excoriation present.  There is no overlying crust drainage or scale.  Minimal warmth Psych: Pleasant and appropriate    Daily was seen today for annual exam and std check.  Diagnoses and all orders for this visit:  Annual physical exam We discussed healthy eating habits, moderate physical activity for 30 minutes 5 times per week (or 150 minutes), safe sex practices, avoiding tobacco products, safe alcohol consumption, and safe driving habits. Discussed cessation of marijuana.  Routine screening for STI (sexually transmitted infection) -     HIV antibody (with reflex) -     RPR -     Hepatitis c antibody (reflex) -     Urine cytology ancillary only  Insect bite of right lower leg with local reaction, initial encounter -     hydrocortisone 1 % ointment; Apply 1 application topically daily for 7 days.  Bee allergy status -     EPINEPHrine (EPIPEN 2-PAK) 0.3 mg/0.3 mL IJ SOAJ injection; Inject 0.3 mLs (0.3 mg total) into the muscle once for 1 dose.  Refilled today  GERD -     omeprazole (PRILOSEC) 20 MG capsule; Take 1 capsule (20 mg total) by mouth daily. Seasonal Allergies  -     cetirizine (ZYRTEC) 10 MG tablet; Take 1 tablet (10 mg total) by mouth daily. -     fluticasone (FLONASE) 50 MCG/ACT  nasal spray; Place 2 sprays into both nostrils daily.   Dorris Singh, MD  Family Medicine Teaching Service

## 2017-12-06 NOTE — Patient Instructions (Signed)
It was wonderful to see you today.  Thank you for choosing [facility].   Please call [phone] with any questions about today's appointment.  Please be sure to schedule follow up at the front  desk before you leave today.   Harleyquinn Gasser, MD  Family Medicine    

## 2017-12-07 LAB — RPR: RPR Ser Ql: NONREACTIVE

## 2017-12-07 LAB — HIV ANTIBODY (ROUTINE TESTING W REFLEX): HIV Screen 4th Generation wRfx: NONREACTIVE

## 2017-12-07 LAB — URINE CYTOLOGY ANCILLARY ONLY
CHLAMYDIA, DNA PROBE: NEGATIVE
Neisseria Gonorrhea: NEGATIVE

## 2017-12-07 LAB — HEPATITIS C ANTIBODY (REFLEX)

## 2017-12-07 LAB — HCV COMMENT:

## 2018-02-23 ENCOUNTER — Ambulatory Visit: Payer: Medicaid Other | Admitting: Family Medicine

## 2018-02-23 ENCOUNTER — Encounter: Payer: Self-pay | Admitting: Family Medicine

## 2018-02-23 ENCOUNTER — Other Ambulatory Visit: Payer: Self-pay

## 2018-02-23 VITALS — BP 135/92 | HR 71 | Temp 98.4°F | Wt 224.8 lb

## 2018-02-23 DIAGNOSIS — B9689 Other specified bacterial agents as the cause of diseases classified elsewhere: Secondary | ICD-10-CM

## 2018-02-23 DIAGNOSIS — J019 Acute sinusitis, unspecified: Principal | ICD-10-CM

## 2018-02-23 DIAGNOSIS — F1290 Cannabis use, unspecified, uncomplicated: Secondary | ICD-10-CM | POA: Diagnosis not present

## 2018-02-23 DIAGNOSIS — R030 Elevated blood-pressure reading, without diagnosis of hypertension: Secondary | ICD-10-CM | POA: Diagnosis not present

## 2018-02-23 MED ORDER — AMOXICILLIN-POT CLAVULANATE 875-125 MG PO TABS: 1.0000 | ORAL_TABLET | Freq: Two times a day (BID) | ORAL | 0 refills | Status: DC

## 2018-02-23 MED ORDER — AMOXICILLIN-POT CLAVULANATE 875-125 MG PO TABS
1.0000 | ORAL_TABLET | Freq: Two times a day (BID) | ORAL | 0 refills | Status: DC
Start: 2018-02-23 — End: 2018-02-23

## 2018-02-23 NOTE — Patient Instructions (Addendum)
I think you may have an overlying bacterial infection in your sinuses.  Augmentin is an antibiotic that we typically prescribed scribe for this this can cause diarrhea.  Take this twice a day with some food  I recommend stopping the Robitussin and other medications over-the-counter as these can elevate your blood pressure  You can take ibuprofen 400 mg 3 times a day and alternate this with 500 mg of Tylenol 3 times a day  Please return to care if you develop chest pain, worsening breathing or fevers   Please make an appointment sometime in June of this year to check in  Humidifier in your room for cough  Honey with tea can help with cough  Nasal saline for your congestion  Please monitor your blood pressure--[facility] has Blood Pressure Cuffs--call if the readings are >140/90.    It was wonderful to see you today.  Thank you for choosing [facility].   Please call [phone] with any questions about today's appointment.  Please be sure to schedule follow up at the front  desk before you leave today.   Dorris Singh, MD  Family Medicine

## 2018-02-23 NOTE — Telephone Encounter (Signed)
Re-sent Augmentin to [facility] at patient request.  Danley Danker, RN [facility] RN)

## 2018-02-23 NOTE — Progress Notes (Signed)
  Patient Name: Stephen Frank Date of Birth: 06/07/1990 Date of Visit: 02/23/18 PCP: Martyn Malay, MD  Chief Complaint: sinus pressure X1 week   Subjective: Stephen Frank is a pleasant 28 y.o. year old with history significant for elevated blood pressure which is currently diet-controlled and marijuana use presenting today for sinus congestion.  The patient reports 3 weeks ago had the onset of cough and congestion.  He describes this as a common cold and thought he got a lot better.  He used usual over-the-counter supplements and felt better after about 5 to 6 days.  Approximately 3 to 4 days after feeling better he began to feel worse again.  He reports a headache, sinus congestion, yellow to greenish sinus discharge, and mildly productive cough.  He reports postnasal drip and a sore throat.  He specifically denies fevers, chest pain, difficulty breathing, lower extremity edema.  He is currently using over-the-counter cough and cold medications.  The patient is elevated blood pressure today.  He reports he did smoke before he came in.  He is taking over-the-counter decongestants every 4-6 hours.  Denies chest pain, lower extremity edema, difficulty breathing   ROS:  ROS  I have reviewed the patient's medical, surgical, family, and social history as appropriate.   Vitals:   02/23/18 1016  BP: (!) 135/92  Pulse: 71  Temp: 98.4 F (36.9 C)  SpO2: 100%   Filed Weights   02/23/18 1016  Weight: 224 lb 12.8 oz (102 kg)   HEENT: Sclera anicteric. Dentition is moderate. Appears well hydrated. Neck: Supple no LAD + Sinus tenderness  Cardiac: Regular rate and rhythm. Normal S1/S2. No murmurs, rubs, or gallops appreciated. Lungs: Clear bilaterally to ascultation.  Abdomen: Normoactive bowel sounds. No tenderness to deep or light palpation. No rebound or guarding.  Extremities: Warm, well perfused without edema.  Skin: Warm, dry Psych: Pleasant and appropriate     Stephen Frank was  seen today for facial pressure.  Diagnoses and all orders for this visit:  Acute bacterial sinusitis -     amoxicillin-clavulanate (AUGMENTIN) 875-125 MG tablet; Take 1 tablet by mouth 2 (two) times daily. - Discussed appropriate care for illness including nasal saline, humidified air, honey, drinking fluids including dilute juice, Tylenol, and Motrin. Handout provided with dosing.   Elevated BP-this is most likely due to over-the-counter decongestant use, he is to monitor at local pharmacy, call if >140/90 or symptoms.     Dorris Singh, MD  Family Medicine Teaching Service

## 2018-02-28 ENCOUNTER — Ambulatory Visit: Payer: Self-pay | Admitting: Nurse Practitioner

## 2018-04-19 ENCOUNTER — Telehealth (INDEPENDENT_AMBULATORY_CARE_PROVIDER_SITE_OTHER): Payer: Medicaid Other | Admitting: Family Medicine

## 2018-04-19 ENCOUNTER — Encounter: Payer: Self-pay | Admitting: Family Medicine

## 2018-04-19 ENCOUNTER — Telehealth: Payer: Self-pay | Admitting: Family Medicine

## 2018-04-19 DIAGNOSIS — R0981 Nasal congestion: Secondary | ICD-10-CM | POA: Diagnosis not present

## 2018-04-19 DIAGNOSIS — J302 Other seasonal allergic rhinitis: Secondary | ICD-10-CM

## 2018-04-19 MED ORDER — FLUTICASONE PROPIONATE 50 MCG/ACT NA SUSP: 2.0000 | Freq: Every day | NASAL | 5 refills | Status: DC

## 2018-04-19 NOTE — Assessment & Plan Note (Signed)
Seems to be viral or allergic nasal congestion with PND causing cough.  Recommend afrin nasal spray twice a day for 3 days only with nasal saline and restart his flonase.  If not improving in one week or worsening call us.  Avoid any older individuals.  I sent him a note for work through [employer]

## 2018-04-19 NOTE — Telephone Encounter (Signed)
[place] Telemedicine Visit  Patient consented to have visit conducted via telephone.  Encounter participants: Patient: Stephen Frank  Provider: Lind Covert  Others (if applicable): none  Chief Complaint: Nasal congestion and cough  HPI:  Had sinus infection treated with antibiotics in Jan.  Improved but now has nasal congestion along with PND and cough. No fever or shortness of breath Takes daily zyrtec.   Not using nasal steroids or saline.   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  Recommend afrin nasal spray twice a day for 3 days only with nasal saline and restart his flonase.  If not improving in one week or worsening call us.  Avoid any older individuals.  I sent him a note for work through [employer]     Time spent on phone with patient: 9 minutes

## 2018-04-20 NOTE — Telephone Encounter (Signed)
See Mychart note 

## 2018-04-30 ENCOUNTER — Telehealth: Payer: Self-pay | Admitting: Family Medicine

## 2018-04-30 NOTE — Telephone Encounter (Signed)
Patient called regarding a few concerns about slight symptoms of COVOID-19, and stated that he had a few other concerns also. Please give patient a call back.

## 2018-04-30 NOTE — Telephone Encounter (Signed)
[facility] Telephone Triage Note  Wants to be tested for STDs. Had protected sex with a new male partner yesterday. Used condom but afterward noted some bumps in her pubic area and patient became scared he was exposed to herpes or warts. He put lysol and hand sanitizer on his genitals to kill any viruses.  Advised of typical std screening regimen (gc/chl, HIV, RPR) and that we could do blood test for herpes but it may not be positive for several weeks. Patient agreeable to waiting weeks for testing. Appointment scheduled on well care schedule on 4/17 AM.  Stressed importance of social distancing and only being around people with whom he lives in light of the [place] pandemic.  Patient denies symptoms of COVID at this time - no cough, shortness of breath, fever. Asked that he call us prior to his appointment if he has any of these symptoms, since we are not able to see patients with those symptoms in the office at this time.  Patient agreeable & appreciative.  Chrisandra Netters, MD [facility]

## 2018-05-11 ENCOUNTER — Ambulatory Visit: Payer: Medicaid Other

## 2018-05-20 NOTE — Progress Notes (Deleted)
  Subjective:   Patient ID: Ahmed Prima    DOB: 1990-11-23, 28 y.o. male   MRN: [id number]  KINNEY SACKMANN is a 28 y.o. male with a history of GERD, bipolar d/o, tobacco use, lichen siimplex here for   STD check - Had protected sex with condom with new male partner 04/29/18. Then noticed bumps in genital area, concern he may have herpes.*** - ***  has had multiple STD testing, all negative here***  Review of Systems:  Per HPI.  [place], medications and smoking status reviewed.  Objective:   There were no vitals taken for this visit. Vitals and nursing note reviewed.  General: well nourished, well developed, in no acute distress with non-toxic appearance HEENT: normocephalic, atraumatic, moist mucous membranes Neck: supple, non-tender without lymphadenopathy CV: regular rate and rhythm without murmurs, rubs, or gallops, no lower extremity edema Lungs: clear to auscultation bilaterally with normal work of breathing Abdomen: soft, non-tender, non-distended, no masses or organomegaly palpable, normoactive bowel sounds Skin: warm, dry, no rashes or lesions Extremities: warm and well perfused, normal tone MSK: ROM grossly intact, strength intact, gait normal Neuro: Alert and oriented, speech normal  Assessment & Plan:   No problem-specific Assessment & Plan notes found for this encounter.  No orders of the defined types were placed in this encounter.  No orders of the defined types were placed in this encounter.   Rory Percy, DO PGY-2, [facility] 05/20/2018 11:40 AM

## 2018-05-21 ENCOUNTER — Other Ambulatory Visit: Payer: Self-pay

## 2018-05-21 ENCOUNTER — Ambulatory Visit: Payer: Medicaid Other | Admitting: Family Medicine

## 2018-05-21 ENCOUNTER — Other Ambulatory Visit (HOSPITAL_COMMUNITY)
Admission: RE | Admit: 2018-05-21 | Discharge: 2018-05-21 | Disposition: A | Discharge status: Home / Self Care | Payer: Medicaid Other | Source: Ambulatory Visit | Attending: Family Medicine | Admitting: Family Medicine

## 2018-05-21 VITALS — BP 142/82 | HR 81 | Temp 98.3°F | Ht 71.0 in | Wt 215.8 lb

## 2018-05-21 DIAGNOSIS — Z113 Encounter for screening for infections with a predominantly sexual mode of transmission: Secondary | ICD-10-CM

## 2018-05-21 NOTE — Patient Instructions (Signed)
I will call you with the results of the STI tests, although I expect they will be negative.  If you start to develop symptoms like pain when you urinate, penile discharge, or any rashes or skin lesions on your genital region please make another appointment.

## 2018-05-21 NOTE — Assessment & Plan Note (Signed)
Patient asymptomatic and with one small lesion on his pubic region consistent with an ingrown hair, but would like STI testing done d/t concern for recent sexual encounter he had with a new partner.  Has not been sexually active otherwise other than this one encounter in which he used a condom.   - GC/Chlamydia, RPR, HIV testing.  - call pt with results

## 2018-05-21 NOTE — Progress Notes (Signed)
   [facility] Phone: [phone]   cc: STI screen  Subjective:  Was having sex with an new partner three weeks ago. One encounter.  Used condoms.  Vaginal intercourse  only. He said he noticed some 'bumps' on her pubic area and he asked her about it.  She said they were 'burn marks'.  No other sexual encoutners. He has one bump where he shaves his pubic hair, but it doesn't itch or hurt. He  Had chlamydia once before and he took abx for it.    ROS: See HPI for pertinent positives and negatives  Past Medical History  Family history reviewed for today's visit. No changes.   Objective: BP (!) 142/82   Pulse 81   Temp 98.3 F (36.8 C) (Oral)   Ht 5\' 11"  (1.803 m)   Wt 215 lb 12.8 oz (97.9 kg)   SpO2 99%   BMI 30.10 kg/m  Gen: NAD, alert and oriented, cooperative with exam Neck: FROM, supple, no masses CV: normal rate, regular rhythm. No murmurs, no rubs.  Resp: LCTAB, no wheezes, crackles. normal work of breathing Skin: small papule on left side of pubic region. Nonpainful/nonpuritic  Psych: Appropriate behavior  Assessment/Plan: Screen for STD (sexually transmitted disease) Patient asymptomatic and with one small lesion on his pubic region consistent with an ingrown hair, but would like STI testing done d/t concern for recent sexual encounter he had with a new partner.  Has not been sexually active otherwise other than this one encounter in which he used a condom.   - GC/Chlamydia, RPR, HIV testing.  - call pt with results     Clemetine Marker, MD PGY-1

## 2018-05-22 LAB — HIV ANTIBODY (ROUTINE TESTING W REFLEX): HIV Screen 4th Generation wRfx: NONREACTIVE

## 2018-05-22 LAB — RPR: RPR Ser Ql: NONREACTIVE

## 2018-05-22 LAB — URINE CYTOLOGY ANCILLARY ONLY
Chlamydia: NEGATIVE
Neisseria Gonorrhea: NEGATIVE

## 2018-05-28 ENCOUNTER — Telehealth: Payer: Self-pay | Admitting: Family Medicine

## 2018-05-28 NOTE — Telephone Encounter (Signed)
Patient was called the day after his visit.  He did not answer and I left a voicemail informing him of his negative results.

## 2018-05-28 NOTE — Telephone Encounter (Signed)
Pt called to get his test results back from his last visit. Please give pt a call back.

## 2018-05-30 NOTE — Telephone Encounter (Signed)
Pt informed. Deseree Blount, CMA  

## 2018-06-08 ENCOUNTER — Encounter: Payer: Self-pay | Admitting: Family Medicine

## 2018-06-11 ENCOUNTER — Encounter: Payer: Self-pay | Admitting: Family Medicine

## 2018-06-11 ENCOUNTER — Ambulatory Visit (INDEPENDENT_AMBULATORY_CARE_PROVIDER_SITE_OTHER): Payer: Medicaid Other | Admitting: Family Medicine

## 2018-06-11 ENCOUNTER — Other Ambulatory Visit: Payer: Self-pay

## 2018-06-11 VITALS — BP 126/72 | HR 85

## 2018-06-11 DIAGNOSIS — L739 Follicular disorder, unspecified: Secondary | ICD-10-CM | POA: Diagnosis present

## 2018-06-11 MED ORDER — MUPIROCIN 2 % EX OINT: 1.0000 "application " | TOPICAL_OINTMENT | Freq: Two times a day (BID) | CUTANEOUS | 0 refills | Status: DC

## 2018-06-11 NOTE — Patient Instructions (Signed)
We are not testing you for herpes today because you would likely be positive given your history of cold sores. The lesions you have are NOT herpes, they are a local skin inflammation called folliculitis, which often comes with shaving. Some more information is below. You can apply the topical antibiotic twice per day. Call us if things change. These lesions do not need drainage unless they get much larger, more painful, or have a large white bump.    Folliculitis  Folliculitis is inflammation of the hair follicles. Folliculitis most commonly occurs on the scalp, thighs, legs, back, and buttocks. However, it can occur anywhere on the body. What are the causes? This condition may be caused by:  A bacterial infection (common).  A fungal infection.  A viral infection.  Coming into contact with certain chemicals, especially oils and tars.  Shaving or waxing.  Applying greasy ointments or creams to your skin often. Long-lasting folliculitis and folliculitis that keeps coming back can be caused by bacteria that live in the nostrils. What increases the risk? This condition is more likely to develop in people with:  A weakened immune system.  Diabetes.  Obesity. What are the signs or symptoms? Symptoms of this condition include:  Redness.  Soreness.  Swelling.  Itching.  Small white or yellow, pus-filled, itchy spots (pustules) that appear over a reddened area. If there is an infection that goes deep into the follicle, these may develop into a boil (furuncle).  A group of closely packed boils (carbuncle). These tend to form in hairy, sweaty areas of the body. How is this diagnosed? This condition is diagnosed with a skin exam. To find what is causing the condition, your health care provider may take a sample of one of the pustules or boils for testing. How is this treated? This condition may be treated by:  Applying warm compresses to the affected areas.  Taking an antibiotic  medicine or applying an antibiotic medicine to the skin.  Applying or bathing with an antiseptic solution.  Taking an over-the-counter medicine to help with itching.  Having a procedure to drain any pustules or boils. This may be done if a pustule or boil contains a lot of pus or fluid.  Laser hair removal. This may be done to treat long-lasting folliculitis. Follow these instructions at home:  If directed, apply heat to the affected area as often as told by your health care provider. Use the heat source that your health care provider recommends, such as a moist heat pack or a heating pad. ? Place a towel between your skin and the heat source. ? Leave the heat on for 20-30 minutes. ? Remove the heat if your skin turns bright red. This is especially important if you are unable to feel pain, heat, or cold. You may have a greater risk of getting burned.  If you were prescribed an antibiotic medicine, use it as told by your health care provider. Do not stop using the antibiotic even if you start to feel better.  Take over-the-counter and prescription medicines only as told by your health care provider.  Do not shave irritated skin.  Keep all follow-up visits as told by your health care provider. This is important. Get help right away if:  You have more redness, swelling, or pain in the affected area.  Red streaks are spreading from the affected area.  You have a fever. This information is not intended to replace advice given to you by your health care provider.  Make sure you discuss any questions you have with your health care provider. Document Released: 03/21/2001 Document Revised: 07/31/2015 Document Reviewed: 10/31/2014 Elsevier Interactive Patient Education  2019 [employer].

## 2018-06-11 NOTE — Progress Notes (Signed)
   CC: c/w possible STI/skin bumps   HPI  Recent STI testing negative. Worried about HSV as he has had lesions on his lips (white head that popped) and one lesion on his belly and then another. Not draining, not painful. No fever. No penile discharge. Women only, one partner his girlfriend and no known STIs, she just had clean testing. They are monogamous. No known HSV contact. Using condoms. He does use a beard trimmer for both his face and his pubic hair. Has had "hair bumps" in the past as well.   ROS: Denies CP, SOB, abdominal pain, dysuria, changes in BMs.   CC, SH/smoking status, and VS noted  Objective: BP 126/72   Pulse 85   SpO2 99%  Gen: NAD, alert, cooperative, and pleasant. HEENT: NCAT, EOMI, PERRL CV: RRR, no murmur Resp: CTAB, no wheezes, non-labored Skin: 2 whiteheads around R upper lip without fluctuance or drainage; 3 11mm lesions around base of follicles on lower abdomen, not on penile shaft. These are hyperpigmented, raised, without drainage or surrounding induration.  Ext: No edema, warm Neuro: Alert and oriented, Speech clear, No gross deficits  Assessment and plan:  Folliculitis - patient is repeatedly questioning whether this could be HSV. I explained that HSV blood testing would not be helpful as he will likely be positive due to previous cold sores. These lesions are consistent with folliculitis, do not appear like HSV, and there is nothing to unroof for viral culture. He can use mupirocin on the lesions and warm compresses. He should change razor blades and cleanse the skin. Return if worsening or additional concerns. Continue condom use.   Meds ordered this encounter  Medications  . mupirocin ointment (BACTROBAN) 2 %    Sig: Place 1 application into the nose 2 (two) times daily.    Dispense:  22 g    Refill:  0     Ralene Ok, MD, PGY3 06/13/2018 9:56 AM

## 2018-08-02 ENCOUNTER — Other Ambulatory Visit: Payer: Self-pay | Admitting: Family Medicine

## 2018-08-02 NOTE — Telephone Encounter (Signed)
Pt called to see if he could get a BP medication, pt states that he used to take BP medication a year or two ago and would like to get back them.Please give pt a call back.

## 2018-08-03 NOTE — Telephone Encounter (Signed)
Recommend patient be seen for blood pressure check and concerns.   Dorris Singh, MD  Family Medicine Teaching Service

## 2018-08-06 NOTE — Telephone Encounter (Signed)
LMOVM for pt to call us back to make an appt for his BP. Deseree Kennon Holter, CMA

## 2018-09-06 ENCOUNTER — Other Ambulatory Visit (INDEPENDENT_AMBULATORY_CARE_PROVIDER_SITE_OTHER): Payer: Medicaid Other

## 2018-09-06 ENCOUNTER — Encounter: Payer: Self-pay | Admitting: Internal Medicine

## 2018-09-06 ENCOUNTER — Ambulatory Visit (INDEPENDENT_AMBULATORY_CARE_PROVIDER_SITE_OTHER): Payer: Medicaid Other | Admitting: Internal Medicine

## 2018-09-06 VITALS — BP 130/80 | HR 65 | Ht 71.0 in | Wt 193.0 lb

## 2018-09-06 DIAGNOSIS — R634 Abnormal weight loss: Secondary | ICD-10-CM

## 2018-09-06 DIAGNOSIS — R0789 Other chest pain: Secondary | ICD-10-CM | POA: Diagnosis not present

## 2018-09-06 DIAGNOSIS — G8929 Other chronic pain: Secondary | ICD-10-CM | POA: Diagnosis not present

## 2018-09-06 DIAGNOSIS — R1013 Epigastric pain: Secondary | ICD-10-CM

## 2018-09-06 LAB — CBC WITH DIFFERENTIAL/PLATELET
Basophils Absolute: 0.1 10*3/uL (ref 0.0–0.1)
Basophils Relative: 1.2 % (ref 0.0–3.0)
Eosinophils Absolute: 0.5 10*3/uL (ref 0.0–0.7)
Eosinophils Relative: 8.4 % — ABNORMAL HIGH (ref 0.0–5.0)
HCT: 42.4 % (ref 39.0–52.0)
Hemoglobin: 14.3 g/dL (ref 13.0–17.0)
Lymphocytes Relative: 50.7 % — ABNORMAL HIGH (ref 12.0–46.0)
Lymphs Abs: 3.3 10*3/uL (ref 0.7–4.0)
MCHC: 33.7 g/dL (ref 30.0–36.0)
MCV: 88.2 fl (ref 78.0–100.0)
Monocytes Absolute: 0.4 10*3/uL (ref 0.1–1.0)
Monocytes Relative: 6.7 % (ref 3.0–12.0)
Neutro Abs: 2.2 10*3/uL (ref 1.4–7.7)
Neutrophils Relative %: 33 % — ABNORMAL LOW (ref 43.0–77.0)
Platelets: 177 10*3/uL (ref 150.0–400.0)
RBC: 4.8 Mil/uL (ref 4.22–5.81)
RDW: 13.6 % (ref 11.5–15.5)
WBC: 6.5 10*3/uL (ref 4.0–10.5)

## 2018-09-06 LAB — COMPREHENSIVE METABOLIC PANEL
ALT: 15 U/L (ref 0–53)
AST: 15 U/L (ref 0–37)
Albumin: 4.8 g/dL (ref 3.5–5.2)
Alkaline Phosphatase: 49 U/L (ref 39–117)
BUN: 11 mg/dL (ref 6–23)
CO2: 28 mEq/L (ref 19–32)
Calcium: 9.4 mg/dL (ref 8.4–10.5)
Chloride: 106 mEq/L (ref 96–112)
Creatinine, Ser: 0.98 mg/dL (ref 0.40–1.50)
GFR: 109.93 mL/min (ref >60.00)
Glucose, Bld: 96 mg/dL (ref 70–99)
Potassium: 4 mEq/L (ref 3.5–5.1)
Sodium: 141 mEq/L (ref 135–145)
Total Bilirubin: 0.5 mg/dL (ref 0.2–1.2)
Total Protein: 7.4 g/dL (ref 6.0–8.3)

## 2018-09-06 NOTE — Patient Instructions (Signed)
You have been scheduled for an endoscopy. Please follow written instructions given to you at your visit today. If you use inhalers (even only as needed), please bring them with you on the day of your procedure.   Your provider has requested that you go to the basement level for lab work before leaving today. Press "B" on the elevator. The lab is located at the first door on the left as you exit the elevator.   I appreciate the opportunity to care for you. Silvano Rusk, MD, [facility]

## 2018-09-06 NOTE — Progress Notes (Signed)
Jude,  Labs are ok  Good news  Gatha Mayer, MD, [facility]

## 2018-09-06 NOTE — Progress Notes (Signed)
Stephen Frank 28 y.o. 04/24/1990 [id number]  Assessment & Plan:   Encounter Diagnoses  Name Primary?  . Abdominal pain, chronic, epigastric Yes  . Other chest pain   . Loss of weight      Evaluate these with EGD.  CMET and CBC today.  These will be fasting.  Does not sound like he has diabetes but that would be a cause of unintentional weight loss.  Consider change in therapy pending EGD.The risks and benefits as well as alternatives of endoscopic procedure(s) have been discussed and reviewed. All questions answered. The patient agrees to proceed.   Subjective:   Chief Complaint: Chest pain epigastric pain weight loss  HPI  The patient is here for follow-up, last seen couple of years ago, he has a history of dyspepsia and reflux-like symptoms.  EGD in 2015 with some distal esophagitis changes.  He reports that he continues to have problems with left upper chest pain feels like he cannot belch.  Epigastric discomfort as well.  These occur in between eating and sometimes eating makes it feel better.  He has been on omeprazole 20 mg daily which helps but obviously not completely.  He is also lost weight unintentionally, see below.  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  Wonders if that might be it.  Medications have not changed.  He takes a daily Excedrin or more for chronic headaches.  Otherwise does not ingest much caffeine but there is caffeine in the Excedrin.  No polydipsia or polyphagia or polyuria reported.  He did have a nonfasting elevated glucose in the past.  He is fasting today. Wt Readings from Last 3 Encounters:  09/06/18 193 lb (87.5 kg)  05/21/18 215 lb 12.8 oz (97.9 kg)  02/23/18 224 lb 12.8 oz (102 kg)    Allergies  Allergen Reactions  . Bee Venom Hives and Swelling  . Soy Allergy     Per the pt he eats soy products without sx.  He said he tested positive  to soy on an allergy test.   Current Meds  Medication Sig  . aspirin-acetaminophen-caffeine (EXCEDRIN MIGRAINE) 250-250-65 MG per tablet Take 2 tablets by mouth every 6 (six) hours as needed for headache.  . cetirizine (ZYRTEC) 10 MG tablet Take 1 tablet (10 mg total) by mouth daily.  . cyclobenzaprine (FLEXERIL) 5 MG tablet Take 1 tablet (5 mg total) by mouth 3 (three) times daily as needed for muscle spasms.  . fluticasone (FLONASE) 50 MCG/ACT nasal spray Place 2 sprays into both nostrils daily.  . mupirocin ointment (BACTROBAN) 2 % Place 1 application into the nose 2 (two) times daily.  Marland Kitchen olopatadine (PATANOL) 0.1 % ophthalmic solution Place 1 drop into both eyes 2 (two) times daily.  Marland Kitchen omeprazole (PRILOSEC) 20 MG capsule Take 1 capsule (20 mg total) by mouth daily.   Past Medical History:  Diagnosis Date  . ADHD (attention deficit hyperactivity disorder)   . Allergy   . Asthma   . Bipolar disorder ([place])   . GERD (gastroesophageal reflux disease) 04/25/2012  . H/O chlamydia infection   . History of seasonal allergies   . Hypertension    Past Surgical History:  Procedure Laterality Date  . ESOPHAGOGASTRODUODENOSCOPY    . TONSILLECTOMY AND ADENOIDECTOMY     Social History   Social History Narrative   Lives in [place] with mother.     Goes  to [employer] in [place] - taking General Education classes to get a AA Degree.   Previously incarcerated    6 sexual partners   No protection   Uses cannabis regularly    family history includes Colon cancer in his maternal grandfather; Diabetes in his maternal grandfather, maternal grandmother, and mother; Hyperlipidemia in his maternal grandfather; Hypertension in his mother.   Review of Systems As above  Objective:   Physical Exam @BP  130/80   Pulse 65   Ht 5\' 11"  (1.803 m)   Wt 193 lb (87.5 kg)   BMI 26.92 kg/m @  General:  NAD Eyes:   anicteric Lungs:  clear Heart::  S1S2 no rubs, murmurs or gallops Abdomen:  soft and  nontender, BS+ Ext:   no edema, cyanosis or clubbing    Data Reviewed:   See HPI

## 2018-09-20 ENCOUNTER — Telehealth: Payer: Self-pay

## 2018-09-20 NOTE — Telephone Encounter (Signed)
Covid-19 screening questions   Do you now or have you had a fever in the last 14 days? NO   Do you have any respiratory symptoms of shortness of breath or cough now or in the last 14 days? NO  Do you have any family members or close contacts with diagnosed or suspected Covid-19 in the past 14 days? NO  Have you been tested for Covid-19 and found to be positive? NO        

## 2018-09-21 ENCOUNTER — Other Ambulatory Visit: Payer: Self-pay

## 2018-09-21 ENCOUNTER — Encounter: Payer: Self-pay | Admitting: Internal Medicine

## 2018-09-21 ENCOUNTER — Ambulatory Visit (AMBULATORY_SURGERY_CENTER): Payer: Medicaid Other | Admitting: Internal Medicine

## 2018-09-21 VITALS — BP 110/69 | HR 62 | Temp 98.4°F | Resp 20 | Ht 71.0 in | Wt 193.0 lb

## 2018-09-21 DIAGNOSIS — K2950 Unspecified chronic gastritis without bleeding: Secondary | ICD-10-CM

## 2018-09-21 DIAGNOSIS — K2970 Gastritis, unspecified, without bleeding: Secondary | ICD-10-CM

## 2018-09-21 DIAGNOSIS — K2980 Duodenitis without bleeding: Secondary | ICD-10-CM

## 2018-09-21 DIAGNOSIS — R1013 Epigastric pain: Secondary | ICD-10-CM

## 2018-09-21 MED ORDER — SODIUM CHLORIDE 0.9 % IV SOLN: 500.0000 mL | Freq: Once | INTRAVENOUS | Status: DC

## 2018-09-21 NOTE — Op Note (Addendum)
[place] Patient Name: Stephen Frank Procedure Date: 09/21/2018 4:12 PM MRN: [id number] Endoscopist: Gatha Mayer , MD Age: 28 Referring MD:  Date of Birth: 04/25/1990 Gender: Male Account #: [id number] Procedure:                Upper GI endoscopy Indications:              Epigastric abdominal pain, Weight loss Medicines:                Propofol per Anesthesia, Monitored Anesthesia Care Procedure:                Pre-Anesthesia Assessment:                           - Prior to the procedure, a History and Physical                            was performed, and patient medications and                            allergies were reviewed. The patient's tolerance of                            previous anesthesia was also reviewed. The risks                            and benefits of the procedure and the sedation                            options and risks were discussed with the patient.                            All questions were answered, and informed consent                            was obtained. Prior Anticoagulants: The patient has                            taken no previous anticoagulant or antiplatelet                            agents. ASA Grade Assessment: II - A patient with                            mild systemic disease. After reviewing the risks                            and benefits, the patient was deemed in                            satisfactory condition to undergo the procedure.                           After obtaining informed consent, the endoscope was  passed under direct vision. Throughout the                            procedure, the patient's blood pressure, pulse, and                            oxygen saturations were monitored continuously. The                            Endoscope was introduced through the mouth, and                            advanced to the second part of duodenum. The upper         GI endoscopy was accomplished without difficulty.                            The patient tolerated the procedure well. Scope In: Scope Out: Findings:                 Localized mild inflammation characterized by                            congestion (edema), erosions and granularity was                            found in the prepyloric region of the stomach.                            Biopsies were taken with a cold forceps for                            histology. Verification of patient identification                            for the specimen was done. Estimated blood loss was                            minimal.                           Diffuse moderate inflammation characterized by                            congestion (edema), erosions, erythema and                            granularity was found in the duodenal bulb and in                            the second portion of the duodenum. Biopsies were                            taken with a cold forceps for histology.  Verification of patient identification for the                            specimen was done. Estimated blood loss was minimal.                           The exam was otherwise without abnormality.                           The cardia and gastric fundus were normal on                            retroflexion. Complications:            No immediate complications. Estimated Blood Loss:     Estimated blood loss was minimal. Impression:               - Gastritis. Biopsied.                           - Duodenitis. Biopsied.                           - The examination was otherwise normal. Recommendation:           - Patient has a contact number available for                            emergencies. The signs and symptoms of potential                            delayed complications were discussed with the                            patient. Return to normal activities tomorrow.                             Written discharge instructions were provided to the                            patient.                           - Resume previous diet.                           - Continue present medications.                           - Await pathology results.                           - Fairly intense duodenitis seen? IBD vs peptic -                            is on 20 mg omeprazole  If biopsies unhelpful will likely get CT abd +/-                            pelvis Gatha Mayer, MD 09/21/2018 4:35:49 PM This report has been signed electronically.

## 2018-09-21 NOTE — Progress Notes (Signed)
Called to room to assist during endoscopic procedure.  Patient ID and intended procedure confirmed with present staff. Received instructions for my participation in the procedure from the performing physician.  

## 2018-09-21 NOTE — Progress Notes (Signed)
Temp taken by AR VS taken by CW

## 2018-09-21 NOTE — Progress Notes (Signed)
Report given to PACU, vss 

## 2018-09-21 NOTE — Patient Instructions (Addendum)
There was inflammation at the bottom of the stomach and the upper intestine.  I took biopsies.  Nothing looked like cancer.  Once I see the biopsy results will call with next steps.  Hang in there!  Say hi to your grandmother for me.  I appreciate the opportunity to care for you. Gatha Mayer, MD, FACG  YOU HAD AN ENDOSCOPIC PROCEDURE TODAY AT [facility]:   Refer to the procedure report that was given to you for any specific questions about what was found during the examination.  If the procedure report does not answer your questions, please call your gastroenterologist to clarify.  If you requested that your care partner not be given the details of your procedure findings, then the procedure report has been included in a sealed envelope for you to review at your convenience later.  YOU SHOULD EXPECT: Some feelings of bloating in the abdomen. Passage of more gas than usual.  Walking can help get rid of the air that was put into your GI tract during the procedure and reduce the bloating.  Please Note:  You might notice some irritation and congestion in your nose or some drainage.  This is from the oxygen used during your procedure.  There is no need for concern and it should clear up in a day or so.  SYMPTOMS TO REPORT IMMEDIATELY:   Following upper endoscopy (EGD)  Vomiting of blood or coffee ground material  New chest pain or pain under the shoulder blades  Painful or persistently difficult swallowing  New shortness of breath  Fever of 100F or higher  Black, tarry-looking stools  For urgent or emergent issues, a gastroenterologist can be reached at any hour by calling [phone].   DIET:  We do recommend a small meal at first, but then you may proceed to your regular diet.  Drink plenty of fluids but you should avoid alcoholic beverages for 24 hours.  ACTIVITY:  You should plan to take it easy for the rest of today and you should NOT DRIVE or use heavy  machinery until tomorrow (because of the sedation medicines used during the test).    FOLLOW UP: Our staff will call the number listed on your records 48-72 hours following your procedure to check on you and address any questions or concerns that you may have regarding the information given to you following your procedure. If we do not reach you, we will leave a message.  We will attempt to reach you two times.  During this call, we will ask if you have developed any symptoms of COVID 19. If you develop any symptoms (ie: fever, flu-like symptoms, shortness of breath, cough etc.) before then, please call [phone].  If you test positive for Covid 19 in the 2 weeks post procedure, please call and report this information to [place].    If any biopsies were taken you will be contacted by phone or by letter within the next 1-3 weeks.  Please call us at [phone] if you have not heard about the biopsies in 3 weeks.    SIGNATURES/CONFIDENTIALITY: You and/or your care partner have signed paperwork which will be entered into your electronic medical record.  These signatures attest to the fact that that the information above on your After Visit Summary has been reviewed and is understood.  Full responsibility of the confidentiality of this discharge information lies with you and/or your care-partner.

## 2018-09-25 ENCOUNTER — Telehealth: Payer: Self-pay | Admitting: *Deleted

## 2018-09-25 NOTE — Telephone Encounter (Signed)
  Follow up Call-  Call back number 09/21/2018  Post procedure Call Back phone  # [phone]  Permission to leave phone message Yes  Some recent data might be hidden     Patient questions:  Do you have a fever, pain , or abdominal swelling? No. Pain Score  0 *  Have you tolerated food without any problems? Yes.    Have you been able to return to your normal activities? Yes.    Do you have any questions about your discharge instructions: Diet   No. Medications  No. Follow up visit  No.  Do you have questions or concerns about your Care? No.  Actions: * If pain score is 4 or above: No action needed, pain <4.  1. Have you developed a fever since your procedure? no  2.   Have you had an respiratory symptoms (SOB or cough) since your procedure? no  3.   Have you tested positive for COVID 19 since your procedure no  4.   Have you had any family members/close contacts diagnosed with the COVID 19 since your procedure?  no   If yes to any of these questions please route to Joylene John, RN and Alphonsa Gin, Therapist, sports.

## 2018-09-28 ENCOUNTER — Other Ambulatory Visit: Payer: Self-pay

## 2018-09-28 DIAGNOSIS — K219 Gastro-esophageal reflux disease without esophagitis: Secondary | ICD-10-CM

## 2018-09-28 DIAGNOSIS — R1013 Epigastric pain: Secondary | ICD-10-CM

## 2018-09-28 DIAGNOSIS — Z800 Family history of malignant neoplasm of digestive organs: Secondary | ICD-10-CM

## 2018-09-28 DIAGNOSIS — R634 Abnormal weight loss: Secondary | ICD-10-CM

## 2018-09-28 MED ORDER — OMEPRAZOLE 40 MG PO CPDR: 40.0000 mg | DELAYED_RELEASE_CAPSULE | Freq: Every day | ORAL | 3 refills | Status: DC

## 2018-09-28 NOTE — Progress Notes (Signed)
[facility] - no letter/recall  Office - some stomach and duodenum inflammation no infection or cancer seen  1) Change omeprazole to 40 mg po qd before breakfast # [phone]  2) Order CT abd/pelvis with contrast dx epigastric pain and unintentional weight loss and family hx pancreatic cancer

## 2018-10-15 ENCOUNTER — Other Ambulatory Visit: Payer: Self-pay | Admitting: Internal Medicine

## 2018-10-15 DIAGNOSIS — K219 Gastro-esophageal reflux disease without esophagitis: Secondary | ICD-10-CM

## 2018-10-15 MED ORDER — OMEPRAZOLE 40 MG PO CPDR: 40.0000 mg | DELAYED_RELEASE_CAPSULE | Freq: Every day | ORAL | 3 refills | Status: DC

## 2018-10-15 NOTE — Telephone Encounter (Signed)
Prescription sent to [facility] pharmacy.

## 2018-10-16 ENCOUNTER — Telehealth: Payer: Self-pay | Admitting: Internal Medicine

## 2018-10-16 DIAGNOSIS — R1013 Epigastric pain: Secondary | ICD-10-CM

## 2018-10-16 DIAGNOSIS — Z800 Family history of malignant neoplasm of digestive organs: Secondary | ICD-10-CM

## 2018-10-16 DIAGNOSIS — R634 Abnormal weight loss: Secondary | ICD-10-CM

## 2018-10-16 NOTE — Telephone Encounter (Signed)
Dr Carlean Purl please see note from [facility] regarding peer to peer.

## 2018-10-16 NOTE — Telephone Encounter (Signed)
Per [employer] case# [id number] has been denied.  Here is the reason:  74177 1 CT ABDOMEN and PELVIS; with contrast Denied Based on [employer] Oncology Imaging Guidelines Section: ONC 30.2 Unexplained Weight Loss, we cannot approve this request. Your records show that you have weight loss and the cause is unclear. The reason this request cannot be approved is because: 1. Guidelines may support the requested imaging if a thorough workup fails to reveal the cause of weight loss. A thorough workup should include laboratory tests including a thorough endocrine evaluation, upper and lower GI endoscopy, abdominal ultrasound, and a chest x-ray. The clinical information provided does not describe results of all of these tests and, therefore, the request is not indicated at this time.   Dr. Carlean Purl may be able to do a peer to peer.  Their phone number is [phone].

## 2018-10-17 NOTE — Telephone Encounter (Signed)
Dr Carlean Purl I received a call from [facility].  The CT is tomorrow and will need the Peer to Peer today.  Please advise

## 2018-10-17 NOTE — Telephone Encounter (Signed)
No answer and voice mail full CT cancelled and message sent to pt via My Chart

## 2018-10-17 NOTE — Telephone Encounter (Signed)
You have been scheduled for an abdominal ultrasound at [facility] Radiology (1st floor of hospital) on 10/24/18 at 830 am. Please arrive 15 minutes prior to your appointment for registration. Make certain not to have anything to eat or drink 6 hours prior to your appointment. Should you need to reschedule your appointment, please contact radiology at [phone]. This test typically takes about 30 minutes to perform.

## 2018-10-17 NOTE — Telephone Encounter (Signed)
Thank you very much for letting me know ?

## 2018-10-17 NOTE — Telephone Encounter (Signed)
I have been through this before - let's change to a complete abdominal ultrasound for same reasons  Once we get that we will be able to get CT I think  Thanks

## 2018-10-17 NOTE — Telephone Encounter (Signed)
The pt has been advised of the cancellation of the CT scan and given the appt information and instructions for the [place].  FYI Amy

## 2018-10-18 ENCOUNTER — Inpatient Hospital Stay: Admission: RE | Admit: 2018-10-18 | Payer: Medicaid Other | Source: Ambulatory Visit

## 2018-10-24 ENCOUNTER — Ambulatory Visit (HOSPITAL_COMMUNITY)
Admission: RE | Admit: 2018-10-24 | Discharge: 2018-10-24 | Disposition: A | Discharge status: Home / Self Care | Payer: Medicaid Other | Source: Ambulatory Visit | Attending: Internal Medicine | Admitting: Internal Medicine

## 2018-10-24 ENCOUNTER — Other Ambulatory Visit: Payer: Self-pay

## 2018-10-24 DIAGNOSIS — Z800 Family history of malignant neoplasm of digestive organs: Secondary | ICD-10-CM | POA: Insufficient documentation

## 2018-10-24 DIAGNOSIS — R1013 Epigastric pain: Secondary | ICD-10-CM | POA: Diagnosis present

## 2018-10-24 DIAGNOSIS — R634 Abnormal weight loss: Secondary | ICD-10-CM | POA: Insufficient documentation

## 2018-10-24 NOTE — Progress Notes (Signed)
Abdominal ultrasound is normal  See if we can now get a CT abd/pelvis with contrast  Diagnoses are  Unintentional weight loss Epigastric pain Family history of pancreatic cancer

## 2018-10-29 NOTE — Telephone Encounter (Signed)
Per [employer] Guidelines CT Abd/Pelvis has been denied spoke with Lanny Hurst B Clinical intake coordinator states [employer] can approve a CT Abdomen with contrast only CPT code 74160. Please advise as to how you would like for [place] to proceed?

## 2018-10-30 ENCOUNTER — Telehealth: Payer: Self-pay | Admitting: Internal Medicine

## 2018-10-30 ENCOUNTER — Other Ambulatory Visit: Payer: Self-pay

## 2018-10-30 DIAGNOSIS — R634 Abnormal weight loss: Secondary | ICD-10-CM

## 2018-10-30 DIAGNOSIS — R1013 Epigastric pain: Secondary | ICD-10-CM

## 2018-10-30 DIAGNOSIS — K2980 Duodenitis without bleeding: Secondary | ICD-10-CM

## 2018-10-30 NOTE — Telephone Encounter (Signed)
Patient has been rescheduled for CT abdomen only at [facility] on 11/08/18 8:00.  He understands to go by [facility] radiology and pick up contrast and instructions.

## 2018-10-30 NOTE — Telephone Encounter (Signed)
CT abdomen without pelvis is fine  I get what they are saying

## 2018-10-31 ENCOUNTER — Ambulatory Visit (HOSPITAL_COMMUNITY): Admission: RE | Admit: 2018-10-31 | Payer: Medicaid Other | Source: Ambulatory Visit

## 2018-10-31 ENCOUNTER — Telehealth: Payer: Self-pay | Admitting: Internal Medicine

## 2018-10-31 NOTE — Telephone Encounter (Signed)
noted 

## 2018-11-08 ENCOUNTER — Ambulatory Visit (HOSPITAL_COMMUNITY)
Admission: RE | Admit: 2018-11-08 | Discharge: 2018-11-08 | Disposition: A | Discharge status: Home / Self Care | Payer: Medicaid Other | Source: Ambulatory Visit | Attending: Internal Medicine | Admitting: Internal Medicine

## 2018-11-08 ENCOUNTER — Other Ambulatory Visit: Payer: Self-pay

## 2018-11-08 DIAGNOSIS — K2980 Duodenitis without bleeding: Secondary | ICD-10-CM

## 2018-11-08 DIAGNOSIS — R634 Abnormal weight loss: Secondary | ICD-10-CM | POA: Diagnosis present

## 2018-11-08 DIAGNOSIS — R1013 Epigastric pain: Secondary | ICD-10-CM | POA: Diagnosis not present

## 2018-11-08 MED ORDER — IOHEXOL 300 MG/ML  SOLN
75.0000 mL | Freq: Once | INTRAMUSCULAR | Status: AC | PRN
  Administered 2018-11-08: 75 mL via INTRAVENOUS

## 2018-11-08 MED ORDER — SODIUM CHLORIDE (PF) 0.9 % IJ SOLN
INTRAMUSCULAR | Status: AC
  Filled 2018-11-08: qty 50

## 2018-11-09 NOTE — Progress Notes (Signed)
Amelia,  The CT scan is all ok - good news.  I hope you are feeling better.   I would like you to call soon and schedule a follow-up with me for November - my schedule will fill up so call soon!  Let me know if any ?'s  Best regards,  Gatha Mayer, MD, [facility]

## 2018-11-14 NOTE — Telephone Encounter (Signed)
CT complete

## 2018-11-20 ENCOUNTER — Other Ambulatory Visit: Payer: Self-pay

## 2018-11-20 DIAGNOSIS — Z20822 Contact with and (suspected) exposure to covid-19: Secondary | ICD-10-CM

## 2018-11-20 NOTE — Progress Notes (Unsigned)
n

## 2018-11-21 LAB — NOVEL CORONAVIRUS, NAA: SARS-CoV-2, NAA: NOT DETECTED

## 2019-04-01 ENCOUNTER — Other Ambulatory Visit: Payer: Self-pay

## 2019-04-01 ENCOUNTER — Encounter (HOSPITAL_COMMUNITY): Payer: Self-pay | Admitting: Emergency Medicine

## 2019-04-01 ENCOUNTER — Emergency Department (HOSPITAL_COMMUNITY)
Admission: EM | Admit: 2019-04-01 | Discharge: 2019-04-02 | Disposition: A | Discharge status: Home / Self Care | Payer: No Typology Code available for payment source | Attending: Emergency Medicine | Admitting: Emergency Medicine

## 2019-04-01 DIAGNOSIS — F319 Bipolar disorder, unspecified: Secondary | ICD-10-CM | POA: Diagnosis not present

## 2019-04-01 DIAGNOSIS — M549 Dorsalgia, unspecified: Secondary | ICD-10-CM | POA: Diagnosis not present

## 2019-04-01 DIAGNOSIS — M542 Cervicalgia: Secondary | ICD-10-CM | POA: Diagnosis not present

## 2019-04-01 DIAGNOSIS — F909 Attention-deficit hyperactivity disorder, unspecified type: Secondary | ICD-10-CM | POA: Diagnosis not present

## 2019-04-01 DIAGNOSIS — R0789 Other chest pain: Secondary | ICD-10-CM | POA: Diagnosis not present

## 2019-04-01 DIAGNOSIS — F17210 Nicotine dependence, cigarettes, uncomplicated: Secondary | ICD-10-CM | POA: Insufficient documentation

## 2019-04-01 DIAGNOSIS — I10 Essential (primary) hypertension: Secondary | ICD-10-CM | POA: Diagnosis not present
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
## 2019-04-02 ENCOUNTER — Emergency Department (HOSPITAL_COMMUNITY): Payer: No Typology Code available for payment source

## 2019-04-02 MED ORDER — IBUPROFEN 400 MG PO TABS
400.0000 mg | ORAL_TABLET | Freq: Once | ORAL | Status: AC
  Administered 2019-04-02: 400 mg via ORAL
  Filled 2019-04-02: qty 1

## 2019-04-02 MED ORDER — CYCLOBENZAPRINE HCL 10 MG PO TABS
10.0000 mg | ORAL_TABLET | Freq: Once | ORAL | Status: AC
  Administered 2019-04-02: 10 mg via ORAL
  Filled 2019-04-02: qty 1

## 2019-04-02 MED ORDER — CYCLOBENZAPRINE HCL 10 MG PO TABS: 10.0000 mg | ORAL_TABLET | Freq: Three times a day (TID) | ORAL | 0 refills | Status: DC | PRN

## 2019-04-02 NOTE — ED Notes (Signed)
Pt called out for pain medication; informed pt, MD would be in soon to assess him

## 2019-04-02 NOTE — Discharge Instructions (Addendum)
Always wear your seatbelt when travelling in a car!  Take ibuprofen or naproxen as needed for pain.  Take acetaminophen in addition to the above for additional pain relief.
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
## 2019-04-11 ENCOUNTER — Ambulatory Visit: Payer: Medicaid Other | Admitting: Family Medicine

## 2019-05-14 ENCOUNTER — Other Ambulatory Visit: Payer: Self-pay | Admitting: *Deleted

## 2019-05-14 DIAGNOSIS — J302 Other seasonal allergic rhinitis: Secondary | ICD-10-CM

## 2019-05-14 MED ORDER — FLUTICASONE PROPIONATE 50 MCG/ACT NA SUSP: 2.0000 | Freq: Every day | NASAL | 5 refills | Status: DC

## 2019-05-14 MED ORDER — CETIRIZINE HCL 10 MG PO TABS: 10.0000 mg | ORAL_TABLET | Freq: Every day | ORAL | 5 refills | Status: DC

## 2019-05-16 MED ORDER — CETIRIZINE HCL 10 MG PO TABS: 10.0000 mg | ORAL_TABLET | Freq: Every day | ORAL | 5 refills | Status: DC

## 2019-05-16 MED ORDER — FLUTICASONE PROPIONATE 50 MCG/ACT NA SUSP: 2.0000 | Freq: Every day | NASAL | 5 refills | Status: DC

## 2019-05-16 NOTE — Addendum Note (Signed)
Addended by: Christen Bame D on: 05/16/2019 05:19 PM   Modules accepted: Orders

## 2019-07-22 ENCOUNTER — Ambulatory Visit (INDEPENDENT_AMBULATORY_CARE_PROVIDER_SITE_OTHER): Payer: Medicaid Other | Admitting: Family Medicine

## 2019-07-22 ENCOUNTER — Other Ambulatory Visit (HOSPITAL_COMMUNITY)
Admission: RE | Admit: 2019-07-22 | Discharge: 2019-07-22 | Disposition: A | Discharge status: Home / Self Care | Payer: Medicaid Other | Source: Ambulatory Visit | Attending: Family Medicine | Admitting: Family Medicine

## 2019-07-22 ENCOUNTER — Encounter: Payer: Self-pay | Admitting: Family Medicine

## 2019-07-22 ENCOUNTER — Other Ambulatory Visit: Payer: Self-pay

## 2019-07-22 VITALS — BP 115/82 | HR 75 | Wt 209.4 lb

## 2019-07-22 DIAGNOSIS — K219 Gastro-esophageal reflux disease without esophagitis: Secondary | ICD-10-CM | POA: Diagnosis not present

## 2019-07-22 DIAGNOSIS — D721 Eosinophilia, unspecified: Secondary | ICD-10-CM | POA: Insufficient documentation

## 2019-07-22 DIAGNOSIS — Z0000 Encounter for general adult medical examination without abnormal findings: Secondary | ICD-10-CM | POA: Diagnosis not present

## 2019-07-22 DIAGNOSIS — R1013 Epigastric pain: Secondary | ICD-10-CM | POA: Insufficient documentation

## 2019-07-22 DIAGNOSIS — E663 Overweight: Secondary | ICD-10-CM | POA: Diagnosis not present

## 2019-07-22 DIAGNOSIS — D7219 Other eosinophilia: Secondary | ICD-10-CM | POA: Diagnosis not present

## 2019-07-22 DIAGNOSIS — F1290 Cannabis use, unspecified, uncomplicated: Secondary | ICD-10-CM | POA: Diagnosis not present

## 2019-07-22 DIAGNOSIS — Z113 Encounter for screening for infections with a predominantly sexual mode of transmission: Secondary | ICD-10-CM | POA: Diagnosis present

## 2019-07-22 DIAGNOSIS — F319 Bipolar disorder, unspecified: Secondary | ICD-10-CM | POA: Diagnosis not present

## 2019-07-22 LAB — POCT GLYCOSYLATED HEMOGLOBIN (HGB A1C): Hemoglobin A1C: 5.7 % — AB (ref 4.0–5.6)

## 2019-07-22 MED ORDER — OMEPRAZOLE 40 MG PO CPDR: 40.0000 mg | DELAYED_RELEASE_CAPSULE | Freq: Every day | ORAL | 3 refills | Status: DC

## 2019-07-22 NOTE — Assessment & Plan Note (Signed)
Suspect component of IBS based upon history. Encouraged improvement in dietary patterns that could contribute (often eating on road)---peppermint oil in small amount, can worsen GERD.  CBC with differential today for prior eosinophilia, helminth could also explain above.

## 2019-07-22 NOTE — Assessment & Plan Note (Signed)
Omeprazole refilled, well controlled.

## 2019-07-22 NOTE — Assessment & Plan Note (Signed)
Discussed reducing use.

## 2019-07-22 NOTE — Patient Instructions (Addendum)
It was wonderful to see you today.  Please bring ALL of your medications with you to every visit.   Today we talked about:  **For your headaches  - Limit caffeine - Reduce sugary beverages - Try to get 8 hours of sleep per night  Follow up in 6-8 weeks if your headaches are not better  Try peppermint oil for your stomach  I will call you with results    Thank you for choosing [facility].   Please call [phone] with any questions about today's appointment.  Please be sure to schedule follow up at the front  desk before you leave today.   Dorris Singh, MD  Family Medicine    I highly recommend the COVID vaccine. These vaccines have excellent safety data. Please ask ANY questions you may have.   [facility] has multiple sites for vaccination---appointments are encouraged (online or call the number below) but you can also just walk in.    [place])  [address] [place], [place] [address] Hours: Mon,Thu 8-5, Sat 8-12 Type: [place]  ([place])  [place] [employer] [place], [place] [address] Hours: Thu: 1-5 Type: [place]  Phone Assistance: Please call [phone], Monday through Friday between 7 a.m. and 7 p.m.  NO ID Required For Vaccine Clinics: Vaccine providers may ask for an ID for insurance or HRSA reimbursement purposes. However, everyone will be vaccinated even if they dont present an ID. No one will be turned away.   Other Vaccine Locations: Find all vaccine locations throughout the state of [place] at [email or web address]

## 2019-07-22 NOTE — Assessment & Plan Note (Signed)
We discussed healthy eating habits, moderate physical activity for 30 minutes 5 times per week (or 150 minutes), safe sex practices, avoiding tobacco products, safe alcohol consumption, and safe driving habits.  Discussed reducing cannabis.

## 2019-07-22 NOTE — Progress Notes (Signed)
    SUBJECTIVE:   CHIEF COMPLAINT / HPI:   Stephen Frank is a 29 year old with history of cannabis use, GERD, HTN (diet controlled), and bipolar disorder presenting for annual exam.  He is working as a Administrator. Has noted weight gain. Trying to stay active and reduce sugary beverages. Smoking cannabis---1-2 cigars per week. Alcohol 1-2 times per month. No tobacco. Has had 2 sexual partners in last year. Recently ended relationship, desires STI testing. Moving into his own place in July.  Abdominal Concerns Patient is followed by Dr.Gessner. Has had EGD and CT scan which were unremarkable. Endorses bloating and dyspepsia. Reflux is controlled. Symptoms worsened by fasting, improved with peppermint. No melena or hematochezia. No nausea or vomiting. Has had weight gain.   Headaches Patient has had ongoing headaches. He identifies two types: One headache posterior, wrapping both sides,typically associated with fatigue or caffeine. The other is unilateral, pulsating, with + photophobia/phonophobia. No N/V/ numbness, weakness, speech changes. Denies nighttime headache.  + family history of migraine Drinks 1 cup of coffee+ ten packets   PERTINENT  PMH / PSH/Family/Social History : Reviewed and updated.   OBJECTIVE:   BP 115/82   Pulse 75   Wt 209 lb 6.4 oz (95 kg)   SpO2 96%   BMI 29.21 kg/m   HEENT: Sclera anicteric. Dentition is moderate. Appears well hydrated. Neck: Supple Cardiac: Regular rate and rhythm. Normal S1/S2. No murmurs, rubs, or gallops appreciated. Lungs: Clear bilaterally to ascultation.  Abdomen: Normoactive bowel sounds. No tenderness to deep or light palpation. No rebound or guarding.  Extremities: Warm, well perfused without edema.  Skin: Warm, dry Psych: Pleasant and appropriate  CN II-XII tested and intact.Gait and affect normal.   ASSESSMENT/PLAN:   GERD (gastroesophageal reflux disease) Omeprazole refilled, well controlled.   Cannabis use,  uncomplicated Discussed reducing use.   Bipolar disorder Encouraged to follow up with [facility].   Headaches Suspect both tension type + migraine. Encouraged dietary/sleep changes. Recommended eye examination. He is to keep log,will discuss at follow up. If not improving, consider low dose TCA as prophylaxis.  Overuse headache also possibility. Less likely intracranial pathology given age, history, and normal examination.  Monitor BP---improved on repeat today.   Dyspepsia Suspect component of IBS based upon history. Encouraged improvement in dietary patterns that could contribute (often eating on road)---peppermint oil in small amount, can worsen GERD.  CBC with differential today for prior eosinophilia, helminth could also explain above.   Annual physical exam We discussed healthy eating habits, moderate physical activity for 30 minutes 5 times per week (or 150 minutes), safe sex practices, avoiding tobacco products, safe alcohol consumption, and safe driving habits.  Discussed reducing cannabis.   PCV23 at follow up Declined covid vaccine today--is thinking about it.   Follow up in 1-2 months if headaches not improving   Dorris Singh, [place]

## 2019-07-22 NOTE — Assessment & Plan Note (Signed)
Encouraged to follow up with [facility].

## 2019-07-23 LAB — HEPATITIS B SURFACE ANTIGEN: Hepatitis B Surface Ag: NEGATIVE

## 2019-07-23 LAB — COMPREHENSIVE METABOLIC PANEL
ALT: 16 IU/L (ref 0–44)
AST: 14 IU/L (ref 0–40)
Albumin/Globulin Ratio: 1.8 (ref 1.2–2.2)
Albumin: 4.6 g/dL (ref 4.1–5.2)
Alkaline Phosphatase: 51 IU/L (ref 48–121)
BUN/Creatinine Ratio: 10 (ref 9–20)
BUN: 10 mg/dL (ref 6–20)
Bilirubin Total: 0.2 mg/dL (ref 0.0–1.2)
CO2: 22 mmol/L (ref 20–29)
Calcium: 9.7 mg/dL (ref 8.7–10.2)
Chloride: 106 mmol/L (ref 96–106)
Creatinine, Ser: 0.96 mg/dL (ref 0.76–1.27)
GFR calc Af Amer: 123 mL/min/{1.73_m2} (ref >59)
GFR calc non Af Amer: 106 mL/min/{1.73_m2} (ref >59)
Globulin, Total: 2.5 g/dL (ref 1.5–4.5)
Glucose: 91 mg/dL (ref 65–99)
Potassium: 4.3 mmol/L (ref 3.5–5.2)
Sodium: 148 mmol/L — ABNORMAL HIGH (ref 134–144)
Total Protein: 7.1 g/dL (ref 6.0–8.5)

## 2019-07-23 LAB — CBC WITH DIFFERENTIAL/PLATELET
Basophils Absolute: 0 10*3/uL (ref 0.0–0.2)
Basos: 1 %
EOS (ABSOLUTE): 0.4 10*3/uL (ref 0.0–0.4)
Eos: 7 %
Hematocrit: 41.6 % (ref 37.5–51.0)
Hemoglobin: 13.6 g/dL (ref 13.0–17.7)
Immature Grans (Abs): 0 10*3/uL (ref 0.0–0.1)
Immature Granulocytes: 0 %
Lymphocytes Absolute: 2.6 10*3/uL (ref 0.7–3.1)
Lymphs: 47 %
MCH: 29.4 pg (ref 26.6–33.0)
MCHC: 32.7 g/dL (ref 31.5–35.7)
MCV: 90 fL (ref 79–97)
Monocytes Absolute: 0.4 10*3/uL (ref 0.1–0.9)
Monocytes: 7 %
Neutrophils Absolute: 2 10*3/uL (ref 1.4–7.0)
Neutrophils: 38 %
Platelets: 188 10*3/uL (ref 150–450)
RBC: 4.62 x10E6/uL (ref 4.14–5.80)
RDW: 13.2 % (ref 11.6–15.4)
WBC: 5.4 10*3/uL (ref 3.4–10.8)

## 2019-07-23 LAB — HCV INTERPRETATION

## 2019-07-23 LAB — URINE CYTOLOGY ANCILLARY ONLY
Chlamydia: NEGATIVE
Comment: NEGATIVE
Comment: NORMAL
Neisseria Gonorrhea: NEGATIVE

## 2019-07-23 LAB — RPR: RPR Ser Ql: NONREACTIVE

## 2019-07-23 LAB — HCV AB W REFLEX TO QUANT PCR: HCV Ab: <0.1 s/co ratio (ref 0.0–0.9)

## 2019-07-23 LAB — HIV ANTIBODY (ROUTINE TESTING W REFLEX): HIV Screen 4th Generation wRfx: NONREACTIVE

## 2019-11-01 ENCOUNTER — Other Ambulatory Visit: Payer: Self-pay

## 2019-11-01 ENCOUNTER — Ambulatory Visit (INDEPENDENT_AMBULATORY_CARE_PROVIDER_SITE_OTHER): Payer: Medicaid Other | Admitting: Family Medicine

## 2019-11-01 VITALS — BP 126/82 | HR 65 | Wt 206.0 lb

## 2019-11-01 DIAGNOSIS — R519 Headache, unspecified: Secondary | ICD-10-CM | POA: Insufficient documentation

## 2019-11-01 DIAGNOSIS — G44229 Chronic tension-type headache, not intractable: Secondary | ICD-10-CM | POA: Insufficient documentation

## 2019-11-01 MED ORDER — AMITRIPTYLINE HCL 25 MG PO TABS: 25.0000 mg | ORAL_TABLET | Freq: Every day | ORAL | 2 refills | Status: DC

## 2019-11-01 MED ORDER — AMITRIPTYLINE HCL 10 MG PO TABS: 10.0000 mg | ORAL_TABLET | Freq: Every day | ORAL | 2 refills | Status: DC

## 2019-11-01 NOTE — Progress Notes (Signed)
° ° °  SUBJECTIVE:   CHIEF COMPLAINT / HPI:   Chronic headache, tension type Stephen Frank has presented to clinic today to discuss his chronic headaches.  He reports that he has been having ongoing, almost daily headaches for the past several years.  He describes these headaches as a pressure or cloudy sensation the goes around his whole head.  He might occasionally feel a throbbing sensation but not typically.  These headaches are not usually accompanied by sensitivity to light or noise.  He does not typically experience any nausea or prodrome.  He does not associate these headaches with neck pain or neck stiffness.  He is in the habit of taking 2 Excedrin Migraine pills when he has a headache.  He typically takes 2-4 pills/day when he has a headache.  PERTINENT  PMH / PSH: Marijuana use, chronic headache  OBJECTIVE:   BP 126/82    Pulse 65    Wt 206 lb (93.4 kg)    SpO2 98%    BMI 28.73 kg/m    General: Alert and cooperative and appears to be in no acute distress HEENT: Extraocular muscles intact.  Nontender maxillary and frontal sinuses.  TMs visualized bilaterally and normal.  Oropharynx normal. Pulm: Breathing comfortably on room air.  Normal respiratory effort. Extremities: No peripheral edema. Warm/ well perfused.   Neuro: Cranial nerves grossly intact  ASSESSMENT/PLAN:   Chronic headache This sounds primarily like tension type headaches.  He does note that he occasionally has worsened headaches that might be consistent with migraines.  I am certainly suspicious of a medication overuse headache.  At this point, I will refer to our headache clinic.  We discussed starting amitriptyline to help prevent headaches which she was amenable to trying.  We discussed that this is guaranteed to stop his headaches but is worth it while we wait for him to get connected with a headache clinic. -Wean off Excedrin -Start amitriptyline 25 mg daily, increase every 4 days until you get to 100 mg  daily -Placed ambulatory referral to headache clinic   Blood pressure concerns Normal range blood pressure in clinic today.  No further work-up or treatment at this time.  Matilde Haymaker, MD [place]

## 2019-11-01 NOTE — Assessment & Plan Note (Signed)
This sounds primarily like tension type headaches.  He does note that he occasionally has worsened headaches that might be consistent with migraines.  I am certainly suspicious of a medication overuse headache.  At this point, I will refer to our headache clinic.  We discussed starting amitriptyline to help prevent headaches which she was amenable to trying.  We discussed that this is guaranteed to stop his headaches but is worth it while we wait for him to get connected with a headache clinic. -Wean off Excedrin -Start amitriptyline 25 mg daily, increase every 4 days until you get to 100 mg daily -Placed ambulatory referral to headache clinic

## 2019-11-01 NOTE — Patient Instructions (Addendum)
Chronic headache: I agree with Dr. Owens Shark.  It sounds like your typical headache is most consistent with a tension type headache.  This is the kind of headache that usually improves with Tylenol and Motrin.  Because you have been dealing with this headache for several years, you may have slowly developed something called a medication overuse headache.  This can be difficult to treat and would be best to do with the help of a specialist.  I am going to put in a specialist referral today and you should get a call in the next 1-2 weeks to set up an appointment.  In the meantime, we can also try starting a medicine called Elavil (amitriptyline) at a low dose.  I will start you on 25 mg today recommend that you stay on that dose for the next 4 days.  You can increase your dose by 1 pill every 4 days.  I think you will probably start experiencing MS benefit once you get to 100 mg/day (4 pills).  Once you get to 100 mg/day, do not increase your dose anymore.  Lets plan on bringing you back to clinic in 1 month.  If you are able to get an appointment with the neurologist before then, simply go to your neurologist.  I simply want to make sure that someone is keeping an eye on you with your headaches and this new medication.

## 2020-01-02 ENCOUNTER — Ambulatory Visit (INDEPENDENT_AMBULATORY_CARE_PROVIDER_SITE_OTHER): Payer: Medicaid Other | Admitting: Family Medicine

## 2020-01-02 ENCOUNTER — Other Ambulatory Visit: Payer: Self-pay

## 2020-01-02 ENCOUNTER — Encounter: Payer: Self-pay | Admitting: Family Medicine

## 2020-01-02 VITALS — BP 118/70 | HR 66 | Ht 71.0 in | Wt 208.0 lb

## 2020-01-02 DIAGNOSIS — G44229 Chronic tension-type headache, not intractable: Secondary | ICD-10-CM

## 2020-01-02 DIAGNOSIS — J302 Other seasonal allergic rhinitis: Secondary | ICD-10-CM

## 2020-01-02 MED ORDER — FLUTICASONE PROPIONATE 50 MCG/ACT NA SUSP: 2.0000 | Freq: Every day | NASAL | 6 refills | Status: DC

## 2020-01-02 NOTE — Patient Instructions (Signed)
It was a pleasure to see you today!  Thank you for choosing [facility] for your primary care.   Our plans for today were:  Start taking Claritin or Zyrtec daily to help with your allergies  Use Flonase: 1 spray in each nostril daily until allergy symptoms improve, then as needed  Take Tylenol 500-650mg  every 6 hours as needed for headaches  Wean off of Excedrin slowly  Start amitriptyline 25 mg daily, increase every 4 days until you get to 100 mg daily  Schedule appointment with neurologist   Best Wishes,   Mina Marble, DO

## 2020-01-02 NOTE — Progress Notes (Signed)
Subjective:   Patient ID: Stephen Frank    DOB: 03-24-1990, 29 y.o. male   MRN: [id number]  Stephen Frank is a 29 y.o. male with a history of GERD, lichen simplex chronicus, bipolar disorder, cannabis use, chronic headache, dyspepsia, eosinophilia, tobacco abuse here for headache  Acute on Chronic Headaches: Patient notes that allergies started acting up on Monday when at work.  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  His eyes were significantly itchy, sneezing, rhinorrhea. He took Benadryl that day with some improvement in his allergy symptoms.  He woke up the following morning with a headache that he notes was located all over with pain behind his eyes.  The headache lasted all day thus he took Excedrin throughout the day which did help.  He did improve by that evening however he woke up with another headache on Wednesday morning.  He took some more Excedrin which led to resolution of the headache.  He woke up with a headache this morning again.  He has not taken anything.  He currently has a headache at this time.  Denies any weakness, changes or slurred speech.  Or infectious symptoms other than his allergies.  Denies lacrimation.  Denies photophobia and photophobia. Review patient has chronic history of headaches.  He was instructed to start amitriptyline however patient was hesitant given its a antidepressant just never started it.  He has been using Excedrin almost daily.  He also smokes marijuana daily.  Review of Systems:  Per HPI.   Objective:   BP 118/70   Pulse 66   Ht 5\' 11"  (1.803 m)   Wt 208 lb (94.3 kg)   SpO2 98%   BMI 29.01 kg/m  Vitals and nursing note reviewed.  General: pleasant young male, sitting comfortably in exam chair, well nourished, well developed, in no acute distress with non-toxic appearance HEENT: normocephalic, atraumatic, moist mucous membranes, oropharynx clear without erythema or exudate, cobblestoning present in posterior  oropharynx, TM normal bilaterally with significant cerumen, no pain to palpation of frontal or maxillary sinuses, swollen turbinates R>L CV: regular rate and rhythm without murmurs, rubs, or gallops Lungs: clear to auscultation bilaterally with normal work of breathing on room air, speaking in full sentences Skin: warm, dry MSK:  gait normal Neuro: Alert and oriented, speech normal, neurologically intact  Assessment & Plan:   Chronic headache Acute on chronic likely exacerbated from recent allergy flare.  Also suspect medication overuse headache in setting of daily Excedrin use.  No red flag symptoms or infectious symptoms with normal neurologic exam is reassuring. Has not started amitriptyline as instructed.  Provided extensive education and reassurance amitriptyline.  Patient was open to trial of amitriptyline. -We will manage allergies with over-the-counter daily Zyrtec/Claritin and Flonase -Wean off Excedrin slowly to avoid rebound headache -Use Tylenol 500 to 650 mg every 6 hours.  Instructed that he may use up to 1000 mg to 8 hours if needed.  Not to exceed greater than 4 g/day. - Start amitriptyline 25 mg daily, increase every 4 days until you get to 100 mg daily -Encouraged avoiding marijuana and tobacco use -Stay well-hydrated and get plenty of sleep -Schedule an appointment with neurologist for further evaluation  Seasonal allergic rhinitis History and physical exam consistent with environmental allergy flare. -Daily over-the-counter Claritin/Zyrtec -Flonase: 1 spray in each nostril daily until symptoms improve then as needed  No orders of the defined types were placed in this encounter.  Meds ordered this encounter  Medications  . fluticasone (FLONASE) 50 MCG/ACT nasal spray    Sig: Place 2 sprays into both nostrils daily.    Dispense:  16 g    Refill:  Andrew[place], DO PGY-3, [facility] 01/02/2020 12:19 PM

## 2020-01-02 NOTE — Assessment & Plan Note (Signed)
Acute on chronic likely exacerbated from recent allergy flare.  Also suspect medication overuse headache in setting of daily Excedrin use.  No red flag symptoms or infectious symptoms with normal neurologic exam is reassuring. Has not started amitriptyline as instructed.  Provided extensive education and reassurance amitriptyline.  Patient was open to trial of amitriptyline. -We will manage allergies with over-the-counter daily Zyrtec/Claritin and Flonase -Wean off Excedrin slowly to avoid rebound headache -Use Tylenol 500 to 650 mg every 6 hours.  Instructed that he may use up to 1000 mg to 8 hours if needed.  Not to exceed greater than 4 g/day. - Start amitriptyline 25 mg daily, increase every 4 days until you get to 100 mg daily -Encouraged avoiding marijuana and tobacco use -Stay well-hydrated and get plenty of sleep -Schedule an appointment with neurologist for further evaluation

## 2020-01-02 NOTE — Assessment & Plan Note (Signed)
History and physical exam consistent with environmental allergy flare. -Daily over-the-counter Claritin/Zyrtec -Flonase: 1 spray in each nostril daily until symptoms improve then as needed

## 2020-06-08 NOTE — Progress Notes (Signed)
    SUBJECTIVE:   CHIEF COMPLAINT / HPI:   Back pain: stinging pain bw the shoulder blades.  Only occurs when he's driving. He drives a long distance semi truck. Nothing radiates down the arms.  Sometimes arms 'go to sleep' during driving.  No recent injury. This has been ongoing for  6 months.  Stinging pain lasts longer if he continues to drive. If he stops driving for the day it improves.      PERTINENT  PMH / PSH: none  OBJECTIVE:   BP 122/78   Pulse 82   Wt 214 lb (97.1 kg)   SpO2 97%   BMI 29.85 kg/m   Gen: alert, oriented. No acute distress.  Msk: no midline tenderness.  Normal spinal rom. Normal upper body strength. 5/5 bilaterally.    ASSESSMENT/PLAN:   Thoracic nerve root impingement Secondary to poor posture and prolonged sitting while driving.  Prescribed mobic 15mg  for 4 weeks.  Recommended PT, but pt unable to at this time due to work.  Discussed home exercises he can do.  Discussed sitting in neutral posture while driving.  F/u in 4 weeks.    Routine screening for STI (sexually transmitted infection) Pt desires sti testing. Sexually active.  No symptoms.   - urine gc/ct - hiv,  - rpr     Benay Pike, MD [place]

## 2020-06-09 ENCOUNTER — Encounter: Payer: Self-pay | Admitting: Family Medicine

## 2020-06-09 ENCOUNTER — Ambulatory Visit (INDEPENDENT_AMBULATORY_CARE_PROVIDER_SITE_OTHER): Payer: Medicaid Other | Admitting: Family Medicine

## 2020-06-09 ENCOUNTER — Other Ambulatory Visit: Payer: Self-pay

## 2020-06-09 ENCOUNTER — Other Ambulatory Visit (HOSPITAL_COMMUNITY)
Admission: RE | Admit: 2020-06-09 | Discharge: 2020-06-09 | Disposition: A | Discharge status: Home / Self Care | Payer: Medicaid Other | Source: Ambulatory Visit | Attending: Family Medicine | Admitting: Family Medicine

## 2020-06-09 VITALS — BP 122/78 | HR 82 | Wt 214.0 lb

## 2020-06-09 DIAGNOSIS — M5414 Radiculopathy, thoracic region: Secondary | ICD-10-CM

## 2020-06-09 DIAGNOSIS — R7303 Prediabetes: Secondary | ICD-10-CM

## 2020-06-09 DIAGNOSIS — Z113 Encounter for screening for infections with a predominantly sexual mode of transmission: Secondary | ICD-10-CM

## 2020-06-09 LAB — POCT GLYCOSYLATED HEMOGLOBIN (HGB A1C): Hemoglobin A1C: 5.8 % — AB (ref 4.0–5.6)

## 2020-06-09 MED ORDER — MELOXICAM 15 MG PO TABS: 15.0000 mg | ORAL_TABLET | Freq: Every day | ORAL | 0 refills | Status: DC

## 2020-06-09 NOTE — Patient Instructions (Addendum)
It was nice to see you today,  Your pain is likely due to your posture when you sit in the car.  This is aggravating your paraspinal muscles and causing inflammation.  I have prescribed a medication for you to take once a day for the next 2 weeks to help calm the inflammation.  I would also like you to go to physical therapy if possible.  If not there are also home exercises you can do that I discussed with you.  I have also prescribed you meloxicam.  This is enough for 1 month supply.  There are no refills.  I would like you to follow-up in 4 weeks.  Have a good day,  Clemetine Marker, MD

## 2020-06-10 LAB — URINE CYTOLOGY ANCILLARY ONLY
Chlamydia: NEGATIVE
Comment: NEGATIVE
Comment: NORMAL
Neisseria Gonorrhea: NEGATIVE

## 2020-06-10 LAB — HIV ANTIBODY (ROUTINE TESTING W REFLEX): HIV Screen 4th Generation wRfx: NONREACTIVE

## 2020-06-10 LAB — RPR: RPR Ser Ql: NONREACTIVE

## 2020-06-11 ENCOUNTER — Encounter: Payer: Self-pay | Admitting: Family Medicine

## 2020-06-11 DIAGNOSIS — M5414 Radiculopathy, thoracic region: Secondary | ICD-10-CM | POA: Insufficient documentation

## 2020-06-11 DIAGNOSIS — Z113 Encounter for screening for infections with a predominantly sexual mode of transmission: Secondary | ICD-10-CM | POA: Insufficient documentation

## 2020-06-11 NOTE — Assessment & Plan Note (Signed)
Pt desires sti testing. Sexually active.  No symptoms.   - urine gc/ct - hiv,  - rpr

## 2020-06-11 NOTE — Assessment & Plan Note (Signed)
Secondary to poor posture and prolonged sitting while driving.  Prescribed mobic 15mg  for 4 weeks.  Recommended PT, but pt unable to at this time due to work.  Discussed home exercises he can do.  Discussed sitting in neutral posture while driving.  F/u in 4 weeks.

## 2020-07-27 ENCOUNTER — Encounter (HOSPITAL_COMMUNITY): Payer: Self-pay

## 2020-07-27 ENCOUNTER — Ambulatory Visit (HOSPITAL_COMMUNITY)
Admission: EM | Admit: 2020-07-27 | Discharge: 2020-07-27 | Disposition: A | Discharge status: Home / Self Care | Payer: Medicaid Other | Attending: Urgent Care | Admitting: Urgent Care

## 2020-07-27 ENCOUNTER — Other Ambulatory Visit: Payer: Self-pay

## 2020-07-27 DIAGNOSIS — R5383 Other fatigue: Secondary | ICD-10-CM

## 2020-07-27 DIAGNOSIS — B349 Viral infection, unspecified: Secondary | ICD-10-CM

## 2020-07-27 DIAGNOSIS — R52 Pain, unspecified: Secondary | ICD-10-CM

## 2020-07-27 MED ORDER — KETOROLAC TROMETHAMINE 60 MG/2ML IM SOLN
INTRAMUSCULAR | Status: AC
  Filled 2020-07-27: qty 2

## 2020-07-27 MED ORDER — OSELTAMIVIR PHOSPHATE 75 MG PO CAPS: 75.0000 mg | ORAL_CAPSULE | Freq: Two times a day (BID) | ORAL | 0 refills | Status: DC

## 2020-07-27 MED ORDER — PROMETHAZINE-DM 6.25-15 MG/5ML PO SYRP: 5.0000 mL | ORAL_SOLUTION | Freq: Every evening | ORAL | 0 refills | Status: DC | PRN

## 2020-07-27 MED ORDER — ACETAMINOPHEN 325 MG PO TABS
ORAL_TABLET | ORAL | Status: AC
  Filled 2020-07-27: qty 3

## 2020-07-27 MED ORDER — KETOROLAC TROMETHAMINE 60 MG/2ML IM SOLN
60.0000 mg | Freq: Once | INTRAMUSCULAR | Status: AC
  Administered 2020-07-27: 60 mg via INTRAMUSCULAR

## 2020-07-27 MED ORDER — BENZONATATE 100 MG PO CAPS: 100.0000 mg | ORAL_CAPSULE | Freq: Three times a day (TID) | ORAL | 0 refills | Status: DC | PRN

## 2020-07-27 MED ORDER — ACETAMINOPHEN 325 MG PO TABS
975.0000 mg | ORAL_TABLET | Freq: Once | ORAL | Status: AC
  Administered 2020-07-27: 975 mg via ORAL

## 2020-07-27 NOTE — ED Provider Notes (Signed)
[place]   MRN: [id number] DOB: Jun 09, 1990  Subjective:   Stephen Frank is a 30 y.o. male presenting for acute onset and 1 AM today of severe body aches, fatigue, malaise, subjective fever, headache.  Denies runny or stuffy nose, sore throat, cough, chest pain, shortness of breath.  Patient is a former smoker.  He does have a medical history of asthma but does not use an inhaler currently.  He took an at-home COVID test that was negative, does not want this done.  No current facility-administered medications for this encounter.  Current Outpatient Medications:    amitriptyline (ELAVIL) 25 MG tablet, Take 1 tablet (25 mg total) by mouth at bedtime., Disp: 60 tablet, Rfl: 2   aspirin-acetaminophen-caffeine (EXCEDRIN MIGRAINE) 250-250-65 MG per tablet, Take 2 tablets by mouth every 6 (six) hours as needed for headache., Disp: , Rfl:    fluticasone (FLONASE) 50 MCG/ACT nasal spray, Place 2 sprays into both nostrils daily., Disp: 16 g, Rfl: 6   meloxicam (MOBIC) 15 MG tablet, Take 1 tablet (15 mg total) by mouth daily., Disp: 30 tablet, Rfl: 0   omeprazole (PRILOSEC) 40 MG capsule, Take 1 capsule (40 mg total) by mouth daily., Disp: 90 capsule, Rfl: 3   Allergies  Allergen Reactions   Bee Venom Hives and Swelling   Soy Allergy     Per the pt he eats soy products without sx.  He said he tested positive to soy on an allergy test.    Past Medical History:  Diagnosis Date   ADHD (attention deficit hyperactivity disorder)    Allergy    Asthma    Bipolar disorder ([place])    GERD (gastroesophageal reflux disease) 04/25/2012   H/O chlamydia infection    History of seasonal allergies    Hypertension      Past Surgical History:  Procedure Laterality Date   ESOPHAGOGASTRODUODENOSCOPY     TONSILLECTOMY AND ADENOIDECTOMY      Family History  Problem Relation Age of Onset   Diabetes Mother    Hypertension Mother    Colon cancer Maternal Grandfather    Hyperlipidemia  Maternal Grandfather    Diabetes Maternal Grandfather    Pancreatic cancer Maternal Grandfather    Diabetes Maternal Grandmother    Esophageal cancer Neg Hx    Rectal cancer Neg Hx    Stomach cancer Neg Hx    Prostate cancer Neg Hx     Social History   Tobacco Use   Smoking status: Former    Packs/day: 1.00    Pack years: 0.00    Types: Cigarettes    Start date: 08/25/2014   Smokeless tobacco: Never  Vaping Use   Vaping Use: Never used  Substance Use Topics   Alcohol use: No    Alcohol/week: 0.0 standard drinks   Drug use: Yes    Types: Marijuana    Comment: twice  to three times per week.  Last smoked 10-19-2013    ROS   Objective:   Vitals: BP 132/81 (BP Location: Right Arm)   Pulse (!) 101   Temp 99.6 F (37.6 C) (Oral)   Resp 20   SpO2 99%   Physical Exam Constitutional:      General: He is not in acute distress.    Appearance: Normal appearance. He is well-developed. He is ill-appearing. He is not toxic-appearing or diaphoretic.  HENT:     Head: Normocephalic and atraumatic.     Right Ear: External ear normal.  Left Ear: External ear normal.     Nose: Nose normal.     Mouth/Throat:     Mouth: Mucous membranes are moist.     Pharynx: Oropharynx is clear.  Eyes:     General: No scleral icterus.    Extraocular Movements: Extraocular movements intact.     Pupils: Pupils are equal, round, and reactive to light.  Cardiovascular:     Rate and Rhythm: Normal rate and regular rhythm.     Heart sounds: Normal heart sounds. No murmur heard.   No friction rub. No gallop.  Pulmonary:     Effort: Pulmonary effort is normal. No respiratory distress.     Breath sounds: Normal breath sounds. No stridor. No wheezing, rhonchi or rales.  Neurological:     Mental Status: He is alert and oriented to person, place, and time.  Psychiatric:        Mood and Affect: Mood normal.        Behavior: Behavior normal.        Thought Content: Thought content normal.     Assessment and Plan :   PDMP not reviewed this encounter.  1. Viral syndrome   2. Body aches   3. Other fatigue     As patient does not want any nasal swabs, recommended empiric treatment with Tamiflu for clinical diagnosis of influenza.  Use supportive care otherwise.  Counseled patient on potential for adverse effects with medications prescribed/recommended today, ER and return-to-clinic precautions discussed, patient verbalized understanding.    Jaynee Eagles, PA-C 07/27/20 1218

## 2020-07-27 NOTE — ED Triage Notes (Signed)
Pt presents with generalized body aches and fatigue.

## 2020-07-27 NOTE — Discharge Instructions (Addendum)
We will manage this as a viral illness. For sore throat or cough try using a honey-based tea. Use 3 teaspoons of honey with juice squeezed from half lemon. Place shaved pieces of ginger into 1/2-1 cup of water and warm over stove top. Then mix the ingredients and repeat every 4 hours as needed. Please take ibuprofen 600mg  every 6 hours with food alternating with OR taken together with Tylenol 500mg -650mg  every 6 hours for throat pain, fevers, aches and pains. Hydrate very well with at least 2 liters of water. Eat light meals such as soups (chicken and noodles, vegetable, chicken and wild rice).  Do not eat foods that you are allergic to.  Taking an antihistamine like Zyrtec, Allegra or Claritin can help against postnasal drainage, sinus congestion.  You can take this together with pseudoephedrine (Sudafed) at a dose of 60 mg 3 times a day or twice daily as needed for the same kind of nasal drip, congestion.

## 2020-08-07 ENCOUNTER — Ambulatory Visit (INDEPENDENT_AMBULATORY_CARE_PROVIDER_SITE_OTHER): Payer: Medicaid Other | Admitting: Student

## 2020-08-07 ENCOUNTER — Other Ambulatory Visit: Payer: Self-pay

## 2020-08-07 ENCOUNTER — Other Ambulatory Visit (HOSPITAL_COMMUNITY)
Admission: RE | Admit: 2020-08-07 | Discharge: 2020-08-07 | Disposition: A | Discharge status: Home / Self Care | Payer: Medicaid Other | Source: Ambulatory Visit | Attending: Family Medicine | Admitting: Family Medicine

## 2020-08-07 ENCOUNTER — Encounter: Payer: Self-pay | Admitting: Student

## 2020-08-07 ENCOUNTER — Encounter: Payer: Self-pay | Admitting: Family Medicine

## 2020-08-07 VITALS — BP 124/60 | HR 81 | Ht 71.0 in | Wt 214.0 lb

## 2020-08-07 DIAGNOSIS — N489 Disorder of penis, unspecified: Secondary | ICD-10-CM | POA: Insufficient documentation

## 2020-08-07 NOTE — Patient Instructions (Signed)
It was great to see you! Thank you for allowing me to participate in your care!  I recommend that you always bring your medications to each appointment as this makes it easy to ensure you are on the correct medications and helps [place] not miss when refills are needed.  Our plans for today:  - We have ordered blood tests to check for sexually transmitted infections - We will check your urine for sexually transmitted infections  -Please follow up in 2 weeks   We are checking some labs today, I will call you if they are abnormal will send you a MyChart message or a letter if they are normal.  If you do not hear about your labs in the next 2 weeks please let us know.  Take care and seek immediate care sooner if you develop any concerns.   Dr. Precious Gilding, DO [facility]

## 2020-08-07 NOTE — Progress Notes (Signed)
    SUBJECTIVE:   CHIEF COMPLAINT / HPI:  Stephen Frank is a 30 y.o. male presenting with a slightly erythematous circular papule about 1/2 cm in diameter on the dorsal aspect of the shaft of his penis.  He states it appeared a couple days ago, itches a little on occasion but is not painful. He has a new sexual partner and states he has had two sexual partners in the last 6 months. He states he uses condoms regularly for the entire duration. He last had intercourse about a week ago. He denies any fever.    OBJECTIVE:   BP 124/60   Pulse 81   Ht 5\' 11"  (1.803 m)   Wt 214 lb (97.1 kg)   SpO2 98%   BMI 29.85 kg/m    General: NAD, pleasant, able to participate in exam Skin: slightly erythematous circular papule about 1/2 cm in diameter on dorsal aspect of the shaft of the penis Neuro: alert, no obvious focal deficits Psych: Normal affect and mood  ASSESSMENT/PLAN:   Penile lesion Slightly erythematous circular papule about 1/2 cm in diameter on dorsal aspect of the shaft of the penis present for a couple days after intercourse with a new partner. Concern for STIs, specifically syphilis. Possibly HPV. Pt was initially concerned about cancer or insect bite, however these are very low on the differential.  -Urine test for gonorrhea and chlamydia  -RPR -Hep B surface antigen test -Hep C Ab test -HIV test -Return for f/u with Dr. Owens Shark on 8/8 Dr. Precious Gilding, [place]

## 2020-08-08 ENCOUNTER — Telehealth: Payer: Self-pay | Admitting: Family Medicine

## 2020-08-08 LAB — HEPATITIS C ANTIBODY: Hep C Virus Ab: <0.1 s/co ratio (ref 0.0–0.9)

## 2020-08-08 LAB — RPR: RPR Ser Ql: NONREACTIVE

## 2020-08-08 LAB — HIV ANTIBODY (ROUTINE TESTING W REFLEX): HIV Screen 4th Generation wRfx: NONREACTIVE

## 2020-08-08 LAB — HEPATITIS B SURFACE ANTIGEN: Hepatitis B Surface Ag: NEGATIVE

## 2020-08-08 NOTE — Telephone Encounter (Signed)
Called ID on call (Dr. Tommy Medal) as RPR negative.  Discussed patient's care in case.  Discussed some concern for poxvirus given the patient's presentation.  Given absence of contact with individual with lesions or high risk sexual activity he recommended monitoring and follow-up.  Repeat RPR at follow-up and monitor for additional symptoms.  Will call patient on Monday to discuss.   Dorris Singh, MD  Family Medicine Teaching Service

## 2020-08-10 ENCOUNTER — Telehealth: Payer: Self-pay | Admitting: Student

## 2020-08-10 ENCOUNTER — Encounter: Payer: Self-pay | Admitting: Family Medicine

## 2020-08-10 ENCOUNTER — Other Ambulatory Visit: Payer: Self-pay

## 2020-08-10 ENCOUNTER — Telehealth: Payer: Self-pay | Admitting: Family Medicine

## 2020-08-10 ENCOUNTER — Ambulatory Visit (INDEPENDENT_AMBULATORY_CARE_PROVIDER_SITE_OTHER): Payer: Medicaid Other | Admitting: Family Medicine

## 2020-08-10 VITALS — BP 142/84 | HR 94 | Ht 71.0 in

## 2020-08-10 DIAGNOSIS — N489 Disorder of penis, unspecified: Secondary | ICD-10-CM

## 2020-08-10 LAB — URINE CYTOLOGY ANCILLARY ONLY
Chlamydia: NEGATIVE
Comment: NEGATIVE
Comment: NEGATIVE
Comment: NORMAL
Neisseria Gonorrhea: NEGATIVE
Trichomonas: NEGATIVE

## 2020-08-10 NOTE — Patient Instructions (Signed)
It was great seeing you today!  Today we checked your lesion for HSV. I will call you if the result is abnormal. We have taken a picture so we can monitor it if it progresses.   Keep your follow up appointment with Dr. Owens Shark on 8/8. If the lesion has not gone away by then we will likely take a biopsy sample. If it worsens, becomes painful or spreads prior to then please come in sooner.    If you haven't already, sign up for My Chart to have easy access to your labs results, and communication with your primary care physician.  Feel free to call with any questions or concerns at any time, at [phone].   Take care,  Dr. Shary Key [facility]

## 2020-08-10 NOTE — Progress Notes (Signed)
    SUBJECTIVE:   CHIEF COMPLAINT / HPI:   Stephen Frank is a 30 yo who presents for a follow up on his penile lesion. He reports first noticing the lesion last Wednesday (7/13) because the underside of his penis was itching which he thought was another bug bite since he had been outside working on his lawn prior. He then noted a circular lesion toward the tip of his penis in an area different from where it was itching. He stated it looked like a welt at first. States it was non painful and did not itch. Since his last office visit on 7/15 he states the lesion had some drainage.   Endorses being with with 2 partners in the past few weeks and using condoms. He endorses recent Hx COVID in early July with fevers and body aches. He went to the ED for this on 7/4 but declined a nasal swab.   OBJECTIVE:   BP (!) 142/84   Pulse 94   Ht 5\' 11"  (1.803 m)   SpO2 98%   BMI 29.85 kg/m    General: alert, anxious, NAD CV: RRR no murmurs  Resp: CTAB normal WOB GU: single indurated well circumcised .5cm non erythematous ulcer appearing lesion at dorsal aspect of shaft of penis. Non tender to palpation and without drainage. No swollen inguinal lymph nodes palpated. See image below   Exam chaperoned by Dr. Erin Hearing       ASSESSMENT/PLAN:   No problem-specific Assessment & Plan notes found for this encounter.   Penile lesion Patient presents with a non painful, non pruritic single indurated ulcer on the dorsal shaft of penis. At office visit on 7/15 lesion appeared more like a papule without the induration. Work up completed thus far: Negative for syphilis, gonorrhea, chlamydia, HIV, Hep B, Hep C. Given its evolving appearance, recent sexual intercourse, prior lesions on his legs, recent febrile illness, and negative STI work up, monkey box has moved higher on our differential. Also considering more benign causes such as a bug bite given c/f prior bug bites and outdoor exposure. Today obtained HSV swab.  Dr. Owens Shark will reach out to ID and see if any other testing is indicated. Advised patient to return for f/u appt with Dr. Owens Shark on 8/8 and that if we need to do any further workup prior we will reach out to him. Discussed if lesion still present at follow up appointment may need to biopsy and rule out cancer. Gave strict return precautions.     [place]

## 2020-08-10 NOTE — Telephone Encounter (Signed)
Call patient.  Confirm date of birth.  Given change in lesion recommended evaluation as RPR testing returned negative.  Patient scheduled for 4 PM today.  Discussed with provider and preceptor.  Recommend careful examination of lower extremities as patient said he had prior lesions in that area.  Also recommend HSV PCR of area. Could consider repeat testing of RPR now, PCP will repeat in August.

## 2020-08-11 ENCOUNTER — Telehealth: Payer: Self-pay | Admitting: Family Medicine

## 2020-08-11 NOTE — Telephone Encounter (Signed)
Spoke with ID regarding change in lesion. Reviewed case with Dr. Linus Salmons. Has no further recommendations, low suspicion for pox virus.  Will follow up patient in August, repeat RPR at that time.  Dorris Singh, MD  Family Medicine Teaching Service

## 2020-08-12 ENCOUNTER — Encounter: Payer: Self-pay | Admitting: Family Medicine

## 2020-08-12 LAB — HERPES SIMPLEX VIRUS CULTURE

## 2020-08-13 ENCOUNTER — Telehealth: Payer: Self-pay | Admitting: Family Medicine

## 2020-08-13 NOTE — Telephone Encounter (Signed)
Called patient to discuss multiple MyChart messages.  Discussed test results with patient.  He reports the lesions getting smaller at this time.  Advised against applying antibiotic ointment.  Will monitor he is to message if any new symptoms or signs occur.  Reviewed reasons to call and return to care we will plan to retest for syphilis and other infections at follow-up.

## 2020-08-21 NOTE — Telephone Encounter (Signed)
Error

## 2020-08-31 ENCOUNTER — Ambulatory Visit: Payer: Medicaid Other | Admitting: Family Medicine

## 2020-09-14 ENCOUNTER — Ambulatory Visit: Payer: Medicaid Other | Admitting: Family Medicine

## 2020-10-05 ENCOUNTER — Ambulatory Visit: Payer: Medicaid Other | Admitting: Family Medicine

## 2021-01-10 ENCOUNTER — Ambulatory Visit
Admission: EM | Admit: 2021-01-10 | Discharge: 2021-01-10 | Disposition: A | Discharge status: Home / Self Care | Payer: Medicaid Other | Attending: Internal Medicine | Admitting: Internal Medicine

## 2021-01-10 ENCOUNTER — Encounter: Payer: Self-pay | Admitting: Emergency Medicine

## 2021-01-10 ENCOUNTER — Other Ambulatory Visit: Payer: Self-pay

## 2021-01-10 DIAGNOSIS — G8929 Other chronic pain: Secondary | ICD-10-CM

## 2021-01-10 DIAGNOSIS — R519 Headache, unspecified: Secondary | ICD-10-CM | POA: Diagnosis not present

## 2021-01-10 MED ORDER — KETOROLAC TROMETHAMINE 30 MG/ML IJ SOLN
30.0000 mg | Freq: Once | INTRAMUSCULAR | Status: AC
  Administered 2021-01-10: 09:00:00 30 mg via INTRAMUSCULAR

## 2021-01-10 MED ORDER — DEXAMETHASONE SODIUM PHOSPHATE 10 MG/ML IJ SOLN
10.0000 mg | Freq: Once | INTRAMUSCULAR | Status: AC
  Administered 2021-01-10: 09:00:00 10 mg via INTRAMUSCULAR

## 2021-01-10 NOTE — ED Provider Notes (Signed)
[facility]    CSN: [id number] Arrival date & time: 01/10/21  0806      History   Chief Complaint Chief Complaint  Patient presents with   Headache    HPI DIMA FERRUFINO is a 30 y.o. male.   Patient complains of headache that has been present for approximately 2 days.  Denies nausea, vomiting, blurred vision but does endorse some dizziness at times.  Patient reports that he has a history of chronic headaches and is followed by PCP for this.  He has been prescribed amitriptyline for migraine prevention but reports that he does not take this medication.  He has taken Tylenol twice with minimal improvement in headache.  Denies chest pain, shortness of breath, upper respiratory symptoms.  Patient reports that headache is present to the front of his head and the back of his head, is constant, and is described as a "pressure".     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   Routine screening for STI (sexually transmitted infection) 06/11/2020   Chronic headache 11/01/2019   Dyspepsia 07/22/2019   Eosinophilia 07/22/2019   Cannabis use, uncomplicated 16/96/7893   Tobacco abuse 81/01/7508   Lichen simplex, chronic 11/06/2014   Seasonal allergic rhinitis 05/08/2014   GERD (gastroesophageal reflux disease) 04/25/2012   Bipolar disorder ([place]) 12/14/2011    Past Surgical History:  Procedure Laterality Date   ESOPHAGOGASTRODUODENOSCOPY     TONSILLECTOMY AND ADENOIDECTOMY         Home Medications    Prior to Admission medications   Medication Sig Start Date End Date Taking? Authorizing Provider  amitriptyline (ELAVIL) 25 MG  tablet Take 1 tablet (25 mg total) by mouth at bedtime. 11/01/19   Matilde Haymaker, MD  aspirin-acetaminophen-caffeine (EXCEDRIN MIGRAINE) [phone] MG per tablet Take 2 tablets by mouth every 6 (six) hours as needed for headache.    [provider]  benzonatate (TESSALON) 100 MG capsule Take 1-2 capsules (100-200 mg total) by mouth 3 (three) times daily as needed. 07/27/20   Jaynee Eagles, PA-C  fluticasone (FLONASE) 50 MCG/ACT nasal spray Place 2 sprays into both nostrils daily. 01/02/20   Mullis, Kiersten P, DO  meloxicam (MOBIC) 15 MG tablet Take 1 tablet (15 mg total) by mouth daily. 06/09/20   Benay Pike, MD  omeprazole (PRILOSEC) 40 MG capsule Take 1 capsule (40 mg total) by mouth daily. 07/22/19   Martyn Malay, MD  oseltamivir (TAMIFLU) 75 MG capsule Take 1 capsule (75 mg total) by mouth 2 (two) times daily. 07/27/20   Jaynee Eagles, PA-C  promethazine-dextromethorphan (PROMETHAZINE-DM) 6.25-15 MG/5ML syrup Take 5 mLs by mouth at bedtime as needed for cough. 07/27/20   Jaynee Eagles, PA-C    Family History Family History  Problem Relation Age of Onset   Diabetes Mother    Hypertension Mother    Colon cancer Maternal Grandfather    Hyperlipidemia Maternal Grandfather    Diabetes Maternal Grandfather    Pancreatic cancer Maternal Grandfather    Diabetes Maternal Grandmother    Esophageal cancer Neg Hx    Rectal cancer Neg Hx  Stomach cancer Neg Hx    Prostate cancer Neg Hx     Social History Social History   Tobacco Use   Smoking status: Former    Packs/day: 1.00    Types: Cigarettes    Start date: 08/25/2014   Smokeless tobacco: Never  Vaping Use   Vaping Use: Never used  Substance Use Topics   Alcohol use: No    Alcohol/week: 0.0 standard drinks   Drug use: Yes    Types: Marijuana    Comment: twice  to three times per week.  Last smoked 10-19-2013     Allergies   Bee venom and Soy allergy   Review of Systems Review of Systems Per HPI  Physical Exam Triage  Vital Signs ED Triage Vitals  Enc Vitals Group     BP 01/10/21 0815 (!) 148/89     Pulse Rate 01/10/21 0815 71     Resp 01/10/21 0815 20     Temp 01/10/21 0815 98.7 F (37.1 C)     Temp Source 01/10/21 0815 Oral     SpO2 01/10/21 0815 98 %     Weight 01/10/21 0816 215 lb (97.5 kg)     Height 01/10/21 0816 6' (1.829 m)     Head Circumference --      Peak Flow --      Pain Score 01/10/21 0815 10     Pain Loc --      Pain Edu? --      Excl. in [place]? --    No data found.  Updated Vital Signs BP (!) 148/89 (BP Location: Left Arm)    Pulse 71    Temp 98.7 F (37.1 C) (Oral)    Resp 20    Ht 6' (1.829 m)    Wt 215 lb (97.5 kg)    SpO2 98%    BMI 29.16 kg/m   Visual Acuity Right Eye Distance:   Left Eye Distance:   Bilateral Distance:    Right Eye Near:   Left Eye Near:    Bilateral Near:     Physical Exam Constitutional:      General: He is not in acute distress.    Appearance: Normal appearance. He is not toxic-appearing or diaphoretic.  HENT:     Head: Normocephalic and atraumatic.     Right Ear: Tympanic membrane and ear canal normal.     Left Ear: Tympanic membrane and ear canal normal.     Nose: Nose normal.     Mouth/Throat:     Mouth: Mucous membranes are moist.     Pharynx: No posterior oropharyngeal erythema.  Eyes:     Extraocular Movements: Extraocular movements intact.     Conjunctiva/sclera: Conjunctivae normal.     Pupils: Pupils are equal, round, and reactive to light.  Cardiovascular:     Rate and Rhythm: Normal rate and regular rhythm.     Pulses: Normal pulses.     Heart sounds: Normal heart sounds.  Pulmonary:     Effort: Pulmonary effort is normal. No respiratory distress.     Breath sounds: Normal breath sounds.  Neurological:     General: No focal deficit present.     Mental Status: He is alert and oriented to person, place, and time. Mental status is at baseline.     Cranial Nerves: Cranial nerves 2-12 are intact.     Sensory: Sensation is  intact.     Motor: Motor function is intact.     Coordination: Coordination  is intact.     Gait: Gait is intact.  Psychiatric:        Mood and Affect: Mood normal.        Behavior: Behavior normal.        Thought Content: Thought content normal.        Judgment: Judgment normal.     UC Treatments / Results  Labs (all labs ordered are listed, but only abnormal results are displayed) Labs Reviewed - No data to display  EKG   Radiology No results found.  Procedures Procedures (including critical care time)  Medications Ordered in UC Medications  ketorolac (TORADOL) 30 MG/ML injection 30 mg (30 mg Intramuscular Given 01/10/21 0842)  dexamethasone (DECADRON) injection 10 mg (10 mg Intramuscular Given 01/10/21 0842)    Initial Impression / Assessment and Plan / UC Course  I have reviewed the triage vital signs and the nursing notes.  Pertinent labs & imaging results that were available during my care of the patient were reviewed by me and considered in my medical decision making (see chart for details).     Patient's physical exam appears consistent with migraine or chronic headache.  Patient also has history of this.  Neuro exam is normal so no red flags on exam.  Ketorolac and Decadron IM administered in urgent care today.  Patient advised to go to the hospital if no improvement in the next 24 to 48 hours.  Do not think patient is in need of immediate medical attention at the hospital at this time as physical exam is benign and patient has history of chronic headaches.  Discussed strict return precautions.  Patient verbalized  understanding and was agreeable with plan. Final Clinical Impressions(s) / UC Diagnoses   Final diagnoses:  Chronic nonintractable headache, unspecified headache type     Discharge Instructions      You were given 2 injections in urgent care today to help alleviate your migraine.  Please do not take any over-the-counter ibuprofen, Advil, Aleve for  at least 24 hours following injection.  Please go the hospital if no improvement in headache in the next 24 to 48 hours.    ED Prescriptions   None    PDMP not reviewed this encounter.   Teodora Medici, [place] 01/10/21 [phone]

## 2021-01-10 NOTE — Discharge Instructions (Signed)
You were given 2 injections in urgent care today to help alleviate your migraine.  Please do not take any over-the-counter ibuprofen, Advil, Aleve for at least 24 hours following injection.  Please go the hospital if no improvement in headache in the next 24 to 48 hours.

## 2021-01-10 NOTE — ED Triage Notes (Signed)
Patient c/o headache x 2 days, no nausea or vomiting.  Patient has taken Tylenol w/o relief.  Patient does have a history of migraines.

## 2021-01-12 ENCOUNTER — Other Ambulatory Visit: Payer: Self-pay

## 2021-01-12 ENCOUNTER — Ambulatory Visit (INDEPENDENT_AMBULATORY_CARE_PROVIDER_SITE_OTHER): Payer: BC Managed Care – PPO | Admitting: Family Medicine

## 2021-01-12 VITALS — BP 102/62 | HR 63 | Wt 221.0 lb

## 2021-01-12 DIAGNOSIS — G44229 Chronic tension-type headache, not intractable: Secondary | ICD-10-CM | POA: Diagnosis not present

## 2021-01-12 NOTE — Progress Notes (Signed)
° ° °  SUBJECTIVE:   CHIEF COMPLAINT / HPI:   Mr. Kostick is a 30 yo who presents for recurrent headaches.  Reports that he went to urgent care on Sunday for persistent headache that started on Friday and continued despite using over the counter pain medication, and they gave a migraine cocktail at about 8am and went away but then returned about 3pm. Reports the pain normally occurs in the front of head and back of head, sometimes hurts all over. Currently hurting in front of head and going down back. Light and sound worsens it. Denies nausea. Does notice that when he woke up it seemed darker from his right eye. States he was referred to neurologist in the past given his chronic headaches but wasn't able to see them so requesting another referral. He was prescribed Amitriptyline in the past but declines taking it or wanting to take any other medication because he wants to see a specialist first.   OBJECTIVE:   BP 102/62    Pulse 63    Wt 221 lb (100.2 kg)    SpO2 98%    BMI 29.97 kg/m    Physical exam General: well appearing, NAD HEENT: PERRL, EOMI. Normal conjunctiva. MMM  Cardiovascular: RRR, no murmurs Lungs: CTAB. Normal WOB Abdomen: soft, non-distended, non-tender Skin: warm, dry. No edema Neuro: CN 2-12 in tact. Moving all extremities spontaneously   ASSESSMENT/PLAN:   No problem-specific Assessment & Plan notes found for this encounter.   Headache, chronic  Patient presents with headache occurring primarily in his frontal area for the past 4 days. Was seen at urgent care on Sunday and received Ketoralac and Decadron with relief of pain for about 6 hours. Not relieved with Tylenol. On exam vitals stable and patient neurologically in tact without any focal deficits. Patient requesting another referral to neurology and would prefer to see them first before getting a prescription for a different medication for headache relief. Recommended Tylenol or Excedrin for now and discussed strict  return precautions.   [place]

## 2021-01-12 NOTE — Patient Instructions (Signed)
It was great seeing you today!  You came in for persistent migraines and per your request we are submitting another referral to Neurology. They will contact you in the next 1 to 2 weeks, regular here anything after that give [place] a call clinic and we will check on it.  In the meantime he can continue to alternate between Tylenol and Excedrin for the pain.   Return to the ED for acute vision changes, worsening migraine despite using your pain medication, or any new and concerning symptoms.   Feel free to call with any questions or concerns at any time, at [phone].   Take care,  Dr. Shary Key [facility]

## 2021-01-28 ENCOUNTER — Telehealth: Payer: Self-pay

## 2021-01-28 NOTE — Telephone Encounter (Signed)
Patient calls nurse line checking the status of Neurotology referral. Per chart review I do not see where one was placed. The current on has expired as of October 2022. Patient reports he was never able to make the apt, therefore is not an active Neurology patient.   Will forward to provider who saw patient in December. Please place referral if appropriate.

## 2021-01-29 ENCOUNTER — Other Ambulatory Visit: Payer: Self-pay | Admitting: Family Medicine

## 2021-01-29 DIAGNOSIS — G44229 Chronic tension-type headache, not intractable: Secondary | ICD-10-CM

## 2021-02-26 ENCOUNTER — Ambulatory Visit (INDEPENDENT_AMBULATORY_CARE_PROVIDER_SITE_OTHER): Payer: Medicaid Other | Admitting: Neurology

## 2021-02-26 ENCOUNTER — Encounter: Payer: Self-pay | Admitting: Neurology

## 2021-02-26 VITALS — BP 128/70 | HR 71 | Ht 72.0 in | Wt 221.0 lb

## 2021-02-26 DIAGNOSIS — H539 Unspecified visual disturbance: Secondary | ICD-10-CM | POA: Diagnosis not present

## 2021-02-26 DIAGNOSIS — R51 Headache with orthostatic component, not elsewhere classified: Secondary | ICD-10-CM

## 2021-02-26 DIAGNOSIS — K219 Gastro-esophageal reflux disease without esophagitis: Secondary | ICD-10-CM

## 2021-02-26 DIAGNOSIS — R519 Headache, unspecified: Secondary | ICD-10-CM

## 2021-02-26 DIAGNOSIS — G43709 Chronic migraine without aura, not intractable, without status migrainosus: Secondary | ICD-10-CM | POA: Diagnosis not present

## 2021-02-26 MED ORDER — AJOVY 225 MG/1.5ML ~~LOC~~ SOAJ: 225.0000 mg | SUBCUTANEOUS | 11 refills | Status: DC

## 2021-02-26 MED ORDER — RIZATRIPTAN BENZOATE 10 MG PO TBDP: 10.0000 mg | ORAL_TABLET | ORAL | 11 refills | Status: DC | PRN

## 2021-02-26 NOTE — Patient Instructions (Addendum)
MRI of the brain w/wo contrast Prevention: Ajovy As needed: Rizatriptan for acute/emergent use: Please take one tablet at the onset of your headache. If it does not improve the symptoms please take one additional tablet in 2 hours. Do not take more then 2 tablets in 24hrs. Do not take use more then 2 to 3 times in a week.   Migraine Headache A migraine headache is an intense, throbbing pain on one side or both sides of the head. Migraine headaches may also cause other symptoms, such as nausea, vomiting, and sensitivity to light and noise. A migraine headache can last from 4 hours to 3 days. Talk with your doctor about what things may bring on (trigger) your migraine headaches. What are the causes? The exact cause of this condition is not known. However, a migraine may be caused when nerves in the brain become irritated and release chemicals that cause inflammation of blood vessels. This inflammation causes pain. This condition may be triggered or caused by: Drinking alcohol. Smoking. Taking medicines, such as: Medicine used to treat chest pain (nitroglycerin). Birth control pills. Estrogen. Certain blood pressure medicines. Eating or drinking products that contain nitrates, glutamate, aspartame, or tyramine. Aged cheeses, chocolate, or caffeine may also be triggers. Doing physical activity. Other things that may trigger a migraine headache include: Menstruation. Pregnancy. Hunger. Stress. Lack of sleep or too much sleep. Weather changes. Fatigue. What increases the risk? The following factors may make you more likely to experience migraine headaches: Being a certain age. This condition is more common in people who are 29-97 years old. Being male. Having a family history of migraine headaches. Being Caucasian. Having a mental health condition, such as depression or anxiety. Being obese. What are the signs or symptoms? The main symptom of this condition is pulsating or throbbing  pain. This pain may: Happen in any area of the head, such as on one side or both sides. Interfere with daily activities. Get worse with physical activity. Get worse with exposure to bright lights or loud noises. Other symptoms may include: Nausea. Vomiting. Dizziness. General sensitivity to bright lights, loud noises, or smells. Before you get a migraine headache, you may get warning signs (an aura). An aura may include: Seeing flashing lights or having blind spots. Seeing bright spots, halos, or zigzag lines. Having tunnel vision or blurred vision. Having numbness or a tingling feeling. Having trouble talking. Having muscle weakness. Some people have symptoms after a migraine headache (postdromal phase), such as: Feeling tired. Difficulty concentrating. How is this diagnosed? A migraine headache can be diagnosed based on: Your symptoms. A physical exam. Tests, such as: CT scan or an MRI of the head. These imaging tests can help rule out other causes of headaches. Taking fluid from the spine (lumbar puncture) and analyzing it (cerebrospinal fluid analysis, or CSF analysis). How is this treated? This condition may be treated with medicines that: Relieve pain. Relieve nausea. Prevent migraine headaches. Treatment for this condition may also include: Acupuncture. Lifestyle changes like avoiding foods that trigger migraine headaches. Biofeedback. Cognitive behavioral therapy. Follow these instructions at home: Medicines Take over-the-counter and prescription medicines only as told by your health care provider. Ask your health care provider if the medicine prescribed to you: Requires you to avoid driving or using heavy machinery. Can cause constipation. You may need to take these actions to prevent or treat constipation: Drink enough fluid to keep your urine pale yellow. Take over-the-counter or prescription medicines. Eat foods that are high in fiber,  such as beans, whole  grains, and fresh fruits and vegetables. Limit foods that are high in fat and processed sugars, such as fried or sweet foods. Lifestyle Do not drink alcohol. Do not use any products that contain nicotine or tobacco, such as cigarettes, e-cigarettes, and chewing tobacco. If you need help quitting, ask your health care provider. Get at least 8 hours of sleep every night. Find ways to manage stress, such as meditation, deep breathing, or yoga. General instructions   Keep a journal to find out what may trigger your migraine headaches. For example, write down: What you eat and drink. How much sleep you get. Any change to your diet or medicines. If you have a migraine headache: Avoid things that make your symptoms worse, such as bright lights. It may help to lie down in a dark, quiet room. Do not drive or use heavy machinery. Ask your health care provider what activities are safe for you while you are experiencing symptoms. Keep all follow-up visits as told by your health care provider. This is important. Contact a health care provider if: You develop symptoms that are different or more severe than your usual migraine headache symptoms. You have more than 15 headache days in one month. Get help right away if: Your migraine headache becomes severe. Your migraine headache lasts longer than 72 hours. You have a fever. You have a stiff neck. You have vision loss. Your muscles feel weak or like you cannot control them. You start to lose your balance often. You have trouble walking. You faint. You have a seizure. Summary A migraine headache is an intense, throbbing pain on one side or both sides of the head. Migraines may also cause other symptoms, such as nausea, vomiting, and sensitivity to light and noise. This condition may be treated with medicines and lifestyle changes. You may also need to avoid certain things that trigger a migraine headache. Keep a journal to find out what may  trigger your migraine headaches. Contact your health care provider if you have more than 15 headache days in a month or you develop symptoms that are different or more severe than your usual migraine headache symptoms. This information is not intended to replace advice given to you by your health care provider. Make sure you discuss any questions you have with your health care provider. Document Revised: 05/04/2018 Document Reviewed: 02/22/2018 Elsevier Patient Education  [place] Tablets What is this medication? RIZATRIPTAN (rye za TRIP tan) treats migraines. It works by blocking pain signals and narrowing blood vessels in the brain. It belongs to a group of medications called triptans. It is not used to prevent migraines. This medicine may be used for other purposes; ask your health care provider or pharmacist if you have questions. COMMON BRAND NAME(S): Maxalt-MLT What should I tell my care team before I take this medication? They need to know if you have any of these conditions: Cigarette smoker Circulation problems in fingers and toes Diabetes Heart disease High blood pressure High cholesterol History of irregular heartbeat History of stroke Kidney disease Liver disease Stomach or intestine problems An unusual or allergic reaction to rizatriptan, other medications, foods, dyes, or preservatives Pregnant or trying to get pregnant Breast-feeding How should I use this medication? Take this medication by mouth. Follow the directions on the prescription label. Leave the tablet in the sealed blister pack until you are ready to take it. With dry hands, open the blister and gently remove the tablet.  If the tablet breaks or crumbles, throw it away and take a new tablet out of the blister pack. Place the tablet in the mouth and allow it to dissolve, and then swallow. Do not cut, crush, or chew this medication. You do not need water to take this  medication. Do not take it more often than directed. Talk to your care team regarding the use of this medication in children. While this medication may be prescribed for children as young as 6 years for selected conditions, precautions do apply. Overdosage: If you think you have taken too much of this medicine contact a poison control center or emergency room at once. NOTE: This medicine is only for you. Do not share this medicine with others. What if I miss a dose? This does not apply. This medication is not for regular use. What may interact with this medication? Do not take this medication with any of the following medications: Certain medications for migraine headache like almotriptan, eletriptan, frovatriptan, naratriptan, rizatriptan, sumatriptan, zolmitriptan Ergot alkaloids like dihydroergotamine, ergonovine, ergotamine, methylergonovine MAOIs like Carbex, Eldepryl, Marplan, Nardil, and Parnate This medication may also interact with the following medications: Certain medications for depression, anxiety, or psychotic disorders Propranolol This list may not describe all possible interactions. Give your health care provider a list of all the medicines, herbs, non-prescription drugs, or dietary supplements you use. Also tell them if you smoke, drink alcohol, or use illegal drugs. Some items may interact with your medicine. What should I watch for while using this medication? Visit your care team for regular checks on your progress. Tell your care team if your symptoms do not start to get better or if they get worse. You may get drowsy or dizzy. Do not drive, use machinery, or do anything that needs mental alertness until you know how this medication affects you. Do not stand up or sit up quickly, especially if you are an older patient. This reduces the risk of dizzy or fainting spells. Alcohol may interfere with the effect of this medication. Your mouth may get dry. Chewing sugarless gum or  sucking hard candy and drinking plenty of water may help. Contact your care team if the problem does not go away or is severe. If you take migraine medications for 10 or more days a month, your migraines may get worse. Keep a diary of headache days and medication use. Contact your care team if your migraine attacks occur more frequently. What side effects may I notice from receiving this medication? Side effects that you should report to your care team as soon as possible: Allergic reactions--skin rash, itching, hives, swelling of the face, lips, tongue, or throat Burning, pain, tingling, or color changes in the legs or feet Heart attack--pain or tightness in the chest, shoulders, arms, or jaw, nausea, shortness of breath, cold or clammy skin, feeling faint or lightheaded Heart rhythm changes--fast or irregular heartbeat, dizziness, feeling faint or lightheaded, chest pain, trouble breathing Increase in blood pressure Irritability, confusion, fast or irregular heartbeat, muscle stiffness, twitching muscles, sweating, high fever, seizure, chills, vomiting, diarrhea, which may be signs of serotonin syndrome Raynaud's--cool, numb, or painful fingers or toes that may change color from pale, to blue, to red Seizures Stroke--sudden numbness or weakness of the face, arm, or leg, trouble speaking, confusion, trouble walking, loss of balance or coordination, dizziness, severe headache, change in vision Sudden or severe stomach pain, nausea, vomiting, fever, or bloody diarrhea Vision loss Side effects that usually do not require medical attention (  report to your care team if they continue or are bothersome): Dizziness General discomfort or fatigue This list may not describe all possible side effects. Call your doctor for medical advice about side effects. You may report side effects to FDA at 1-800-FDA-1088. Where should I keep my medication? Keep out of the reach of children and pets. Store at room  temperature between 15 and 30 degrees C (59 and 86 degrees F). Protect from light and moisture. Throw away any unused medication after the expiration date. NOTE: This sheet is a summary. It may not cover all possible information. If you have questions about this medicine, talk to your doctor, pharmacist, or health care provider.  2022 Elsevier/Gold Standard (2020-02-19 00:00:00) Rolanda Lundborg injection What is this medication? FREMANEZUMAB (fre ma NEZ ue mab) is used to prevent migraine headaches. This medicine may be used for other purposes; ask your health care provider or pharmacist if you have questions. COMMON BRAND NAME(S): AJOVY What should I tell my care team before I take this medication? They need to know if you have any of these conditions: an unusual or allergic reaction to fremanezumab, other medicines, foods, dyes, or preservatives pregnant or trying to get pregnant breast-feeding How should I use this medication? This medicine is for injection under the skin. You will be taught how to prepare and give this medicine. Use exactly as directed. Take your medicine at regular intervals. Do not take your medicine more often than directed. It is important that you put your used needles and syringes in a special sharps container. Do not put them in a trash can. If you do not have a sharps container, call your pharmacist or healthcare provider to get one. Talk to your pediatrician regarding the use of this medicine in children. Special care may be needed. Overdosage: If you think you have taken too much of this medicine contact a poison control center or emergency room at once. NOTE: This medicine is only for you. Do not share this medicine with others. What if I miss a dose? If you miss a dose, take it as soon as you can. If it is almost time for your next dose, take only that dose. Do not take double or extra doses. What may interact with this medication? Interactions are not  expected. This list may not describe all possible interactions. Give your health care provider a list of all the medicines, herbs, non-prescription drugs, or dietary supplements you use. Also tell them if you smoke, drink alcohol, or use illegal drugs. Some items may interact with your medicine. What should I watch for while using this medication? Tell your doctor or healthcare professional if your symptoms do not start to get better or if they get worse. What side effects may I notice from receiving this medication? Side effects that you should report to your doctor or health care professional as soon as possible: allergic reactions like skin rash, itching or hives, swelling of the face, lips, or tongue Side effects that usually do not require medical attention (report these to your doctor or health care professional if they continue or are bothersome): pain, redness, or irritation at site where injected This list may not describe all possible side effects. Call your doctor for medical advice about side effects. You may report side effects to FDA at 1-800-FDA-1088. Where should I keep my medication? Keep out of the reach of children. You will be instructed on how to store this medicine. Throw away any unused medicine after the  expiration date on the label. NOTE: This sheet is a summary. It may not cover all possible information. If you have questions about this medicine, talk to your doctor, pharmacist, or health care provider.  2022 Elsevier/Gold Standard (2016-10-11 00:00:00)

## 2021-02-26 NOTE — Progress Notes (Signed)
[facility]    Provider:  Dr Jaynee Eagles Requesting Provider: Lenoria Chime, MD Primary Care Provider:  Martyn Malay, MD  CC:  Migraines  HPI:  Stephen Frank is a 31 y.o. male here as requested by Lenoria Chime, MD for chronic headache 4-5 years.   Headaches started 5+ years ago. 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 He does take energy supplements, discussed the risks including strokes, headaches of taking energy drinks. Tylenol, excedrin may help a little no medication overuse. SOund, light are triggers. Behind the eyes, eye pain. 8 migraine days a month moderate to severe. Worsening. Other days has milder 4/10 headaches. 15 headache days a month total. No other focal neurologic deficits, associated symptoms, inciting events or modifiable factors.   Reviewed notes, labs and imaging from outside physicians, which showed:   From a thorough review of records, Medications tried that can be used in headache/migraine management include: Tylenol, amitriptyline, Norvasc (calcium channel blocker similar to verapamil), Flexeril, Voltaren, Advil, Toradol injections, Mobic tablets, methocarbamol tablets, Reglan injections, naproxen, promethazine injections,imitrex, topamax(tried as a child so likely had migraines).   XR cervical 04/02/2019: personally reviewed images and agree FINDINGS: There is no evidence of cervical spine fracture or prevertebral soft tissue swelling. Alignment is normal. No  other significant bone abnormalities are identified.   IMPRESSION: Negative cervical spine radiographs.     Review of Systems: Patient complains of symptoms per HPI as well as the following symptoms headache. Pertinent negatives and positives per HPI. All others negative.   Social History   Socioeconomic History   Marital status: Single    Spouse name: Not on file   Number of children: Not on file   Years of education: 12 +   Highest education level: Not on file  Occupational History   Occupation: Truck Education administrator: [employer]  Tobacco Use   Smoking status: Former    Packs/day: 1.00    Types: Cigarettes    Start date: 08/25/2014   Smokeless tobacco: Never  Vaping Use   Vaping Use: Never used  Substance and Sexual Activity   Alcohol use: No    Alcohol/week: 0.0 standard drinks   Drug use: Yes    Types: Marijuana    Comment: twice  to three times per week.  Last smoked 10-19-2013   Sexual activity: Yes    Birth control/protection: Condom  Other Topics Concern   Not on file  Social History Narrative   Single, truck driver Engineer, agricultural)   R handed   Caffeine: 1 energy drink daily or every other day    Previously incarcerated    Smoker - marijuana   No EtOH, other drugs   Social Determinants of Radio broadcast assistant Strain: Not on file  Food Insecurity: Not on file  Transportation Needs: Not on file  Physical Activity: Not on file  Stress: Not on file  Social Connections: Not on file  Intimate Partner Violence: Not on  file    Family History  Problem Relation Age of Onset   Diabetes Mother    Hypertension Mother    Colon cancer Maternal Grandfather    Hyperlipidemia Maternal Grandfather    Diabetes Maternal Grandfather    Pancreatic cancer Maternal Grandfather    Diabetes Maternal Grandmother    Esophageal cancer Neg Hx    Rectal cancer Neg Hx    Stomach cancer Neg Hx    Prostate cancer Neg Hx     Past Medical History:   Diagnosis Date   ADHD (attention deficit hyperactivity disorder)    Allergy    Asthma    Bipolar disorder ([place])    GERD (gastroesophageal reflux disease) 04/25/2012   H/O chlamydia infection    History of seasonal allergies    Hypertension     Patient Active Problem List   Diagnosis Date Noted   Chronic migraine without aura without status migrainosus, not intractable 02/26/2021   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   Routine screening for STI (sexually transmitted infection) 06/11/2020   Chronic headache 11/01/2019   Dyspepsia 07/22/2019   Eosinophilia 07/22/2019   Cannabis use, uncomplicated 94/85/4627   Tobacco abuse 03/50/0938   Lichen simplex, chronic 11/06/2014   Seasonal allergic rhinitis 05/08/2014   GERD (gastroesophageal reflux disease) 04/25/2012   Bipolar disorder ([place]) 12/14/2011    Past Surgical History:  Procedure Laterality Date   ESOPHAGOGASTRODUODENOSCOPY     TONSILLECTOMY AND ADENOIDECTOMY      Current Outpatient Medications  Medication Sig Dispense Refill   amitriptyline (ELAVIL) 25 MG tablet Take 1 tablet (25 mg total) by mouth at bedtime. 60 tablet 2   aspirin-acetaminophen-caffeine (EXCEDRIN MIGRAINE) 182-993-71 MG per tablet Take 2 tablets by mouth every 6 (six) hours as needed for headache.     fluticasone (FLONASE) 50 MCG/ACT nasal spray Place 2 sprays into both nostrils daily. 16 g 6   Fremanezumab-vfrm (AJOVY) 225 MG/1.5ML SOAJ Inject 225 mg into the skin every 30 (thirty) days. 1.5 mL 11   rizatriptan (MAXALT-MLT) 10 MG disintegrating tablet Take 1 tablet (10 mg total) by mouth as needed for migraine. May repeat in 2 hours if needed 9 tablet 11   No current facility-administered medications for this visit.    Allergies as of 02/26/2021 - Review Complete 02/26/2021  Allergen Reaction Noted   Bee venom Hives and Swelling 09/01/2015   Soy allergy  08/26/2013    Vitals: BP 128/70    Pulse 71    Ht 6' (1.829 m)    Wt 221 lb (100.2 kg)     BMI 29.97 kg/m  Last Weight:  Wt Readings from Last 1 Encounters:  02/26/21 221 lb (100.2 kg)   Last Height:   Ht Readings from Last 1 Encounters:  02/26/21 6' (1.829 m)     Physical exam: Exam: Gen: NAD, conversant, well nourised, overweight, well groomed                     CV: RRR, no MRG. No Carotid Bruits. No peripheral edema, warm, nontender Eyes: Conjunctivae clear without exudates or hemorrhage  Neuro: Detailed Neurologic Exam  Speech:    Speech is normal; fluent and spontaneous with normal comprehension.  Cognition:    The patient is oriented to person, place, and time;     recent and remote memory intact;     language fluent;     normal attention, concentration,     fund of knowledge Cranial Nerves:    The pupils  are equal, round, and reactive to light. The fundi are flat. Visual fields are full to finger confrontation. Extraocular movements are intact. Trigeminal sensation is intact and the muscles of mastication are normal. The face is symmetric. The palate elevates in the midline. Hearing intact. Voice is normal. Shoulder shrug is normal. The tongue has normal motion without fasciculations.   Coordination:    Normal    Gait:     normal.   Motor Observation:    No asymmetry, no atrophy, and no involuntary movements noted. Tone:    Normal muscle tone.    Posture:    Posture is normal. normal erect    Strength:    Strength is V/V in the upper and lower limbs.      Sensation: intact to LT     Reflex Exam:  DTR's:    Deep tendon reflexes in the upper and lower extremities are normal bilaterally.   Toes:    The toes are downgoing bilaterally.   Clonus:    Clonus is absent.    Assessment/Plan:  31 year old with chronic migraines. Has tried and failed multiple medications over the years. Worsening. Needs thorough examination.  MRI brain due to concerning symptoms of morning headaches, positional headaches,vision changes, worsening headaches to  look for space occupying mass, chiari or intracranial hypertension (pseudotumor), stroke, demyelination or other.   Patient instructions:  MRI of the brain w/wo contrast Prevention: Ajovy (trained in the office on injection use) As needed: Rizatriptan for acute/emergent use: Please take one tablet at the onset of your headache. If it does not improve the symptoms please take one additional tablet in 2 hours. Do not take more then 2 tablets in 24hrs. Do not take use more then 2 to 3 times in a week.   Orders Placed This Encounter  Procedures   MR BRAIN W WO CONTRAST   CBC with Differential/Platelets   Comprehensive metabolic panel   TSH   Meds ordered this encounter  Medications   rizatriptan (MAXALT-MLT) 10 MG disintegrating tablet    Sig: Take 1 tablet (10 mg total) by mouth as needed for migraine. May repeat in 2 hours if needed    Dispense:  9 tablet    Refill:  11   Fremanezumab-vfrm (AJOVY) 225 MG/1.5ML SOAJ    Sig: Inject 225 mg into the skin every 30 (thirty) days.    Dispense:  1.5 mL    Refill:  11    Cc: Pray, Norwood Levo, MD,  Martyn Malay, MD  [name], MD  [facility] [address] [place], [place] [address]  Phone [phone] Fax [phone]

## 2021-02-27 LAB — CBC WITH DIFFERENTIAL/PLATELET
Basophils Absolute: 0.1 10*3/uL (ref 0.0–0.2)
Basos: 1 %
EOS (ABSOLUTE): 0.5 10*3/uL — ABNORMAL HIGH (ref 0.0–0.4)
Eos: 7 %
Hematocrit: 41.4 % (ref 37.5–51.0)
Hemoglobin: 14.2 g/dL (ref 13.0–17.7)
Immature Grans (Abs): 0 10*3/uL (ref 0.0–0.1)
Immature Granulocytes: 0 %
Lymphocytes Absolute: 3.7 10*3/uL — ABNORMAL HIGH (ref 0.7–3.1)
Lymphs: 53 %
MCH: 29.8 pg (ref 26.6–33.0)
MCHC: 34.3 g/dL (ref 31.5–35.7)
MCV: 87 fL (ref 79–97)
Monocytes Absolute: 0.5 10*3/uL (ref 0.1–0.9)
Monocytes: 7 %
Neutrophils Absolute: 2.2 10*3/uL (ref 1.4–7.0)
Neutrophils: 32 %
Platelets: 206 10*3/uL (ref 150–450)
RBC: 4.77 x10E6/uL (ref 4.14–5.80)
RDW: 12.8 % (ref 11.6–15.4)
WBC: 6.9 10*3/uL (ref 3.4–10.8)

## 2021-02-27 LAB — COMPREHENSIVE METABOLIC PANEL
ALT: 26 IU/L (ref 0–44)
AST: 20 IU/L (ref 0–40)
Albumin/Globulin Ratio: 2.2 (ref 1.2–2.2)
Albumin: 4.7 g/dL (ref 4.1–5.2)
Alkaline Phosphatase: 64 IU/L (ref 44–121)
BUN/Creatinine Ratio: 8 — ABNORMAL LOW (ref 9–20)
BUN: 7 mg/dL (ref 6–20)
Bilirubin Total: 0.2 mg/dL (ref 0.0–1.2)
CO2: 23 mmol/L (ref 20–29)
Calcium: 9.4 mg/dL (ref 8.7–10.2)
Chloride: 105 mmol/L (ref 96–106)
Creatinine, Ser: 0.83 mg/dL (ref 0.76–1.27)
Globulin, Total: 2.1 g/dL (ref 1.5–4.5)
Glucose: 85 mg/dL (ref 70–99)
Potassium: 4.2 mmol/L (ref 3.5–5.2)
Sodium: 144 mmol/L (ref 134–144)
Total Protein: 6.8 g/dL (ref 6.0–8.5)
eGFR: 121 mL/min/{1.73_m2} (ref >59)

## 2021-02-27 LAB — TSH: TSH: 0.705 u[IU]/mL (ref 0.450–4.500)

## 2021-02-28 ENCOUNTER — Encounter: Payer: Self-pay | Admitting: Neurology

## 2021-03-03 ENCOUNTER — Telehealth: Payer: Self-pay | Admitting: Neurology

## 2021-03-03 NOTE — Telephone Encounter (Signed)
[facility] community auth: [id number] (exp. 03/03/21 to 04/17/21) order sent to GI, they will reach out to the patient to schedule.

## 2021-03-11 ENCOUNTER — Ambulatory Visit
Admission: RE | Admit: 2021-03-11 | Discharge: 2021-03-11 | Disposition: A | Discharge status: Home / Self Care | Payer: Medicaid Other | Source: Ambulatory Visit | Attending: Neurology | Admitting: Neurology

## 2021-03-11 ENCOUNTER — Other Ambulatory Visit: Payer: Self-pay

## 2021-03-11 DIAGNOSIS — H539 Unspecified visual disturbance: Secondary | ICD-10-CM

## 2021-03-11 DIAGNOSIS — R51 Headache with orthostatic component, not elsewhere classified: Secondary | ICD-10-CM

## 2021-03-11 DIAGNOSIS — R519 Headache, unspecified: Secondary | ICD-10-CM

## 2021-03-11 MED ORDER — GADOBENATE DIMEGLUMINE 529 MG/ML IV SOLN
20.0000 mL | Freq: Once | INTRAVENOUS | Status: AC | PRN
  Administered 2021-03-11: 20 mL via INTRAVENOUS

## 2021-04-13 ENCOUNTER — Other Ambulatory Visit: Payer: Self-pay | Admitting: *Deleted

## 2021-04-13 DIAGNOSIS — J302 Other seasonal allergic rhinitis: Secondary | ICD-10-CM

## 2021-04-13 MED ORDER — FLUTICASONE PROPIONATE 50 MCG/ACT NA SUSP: 2.0000 | Freq: Every day | NASAL | 6 refills | Status: DC

## 2021-04-13 NOTE — Telephone Encounter (Signed)
Pt needing refill on Flonase and Claritan / zyrtec ( he does not remember which one).  Advised I would send message about flonase, but claritan and zyrtec are likely not covered as they are OTC.  He will call his insurance to see if they cover daily antihistamine. Christen Bame, CMA ? ?

## 2021-04-29 IMAGING — CT CT ABDOMEN W/ CM
2 of 4 series · 16 of 46 positions shown, 18 images · IV contrast (OMNIPAQUE)
Comparison: CT AP 10/18/2010 and abdominal sonogram from 10/24/2018

CLINICAL DATA: Epigastric pain.  30 pound weight loss.

EXAM:
CT ABDOMEN WITH CONTRAST
TECHNIQUE: Multidetector CT imaging of the abdomen was performed using the
standard protocol following bolus administration of intravenous
contrast.
CONTRAST:  75mL OMNIPAQUE IOHEXOL 300 MG/ML  SOLN

[Series 2: axial st · axial · 0.66mm/px · z∈[-240,-10]mm · 13 of 54 slices shown, 15 images]
[im 4/54  soft-tissue]
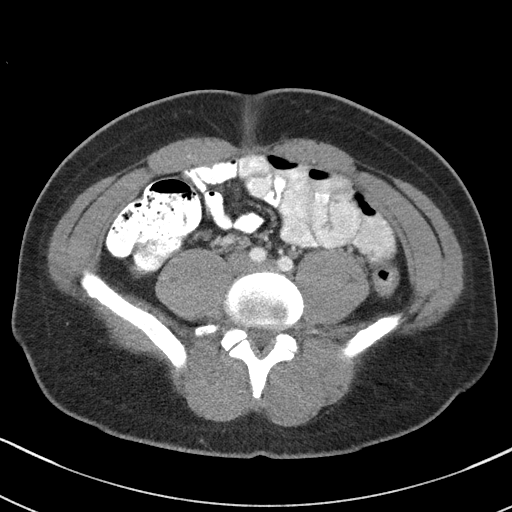
[im 4/54  bone]
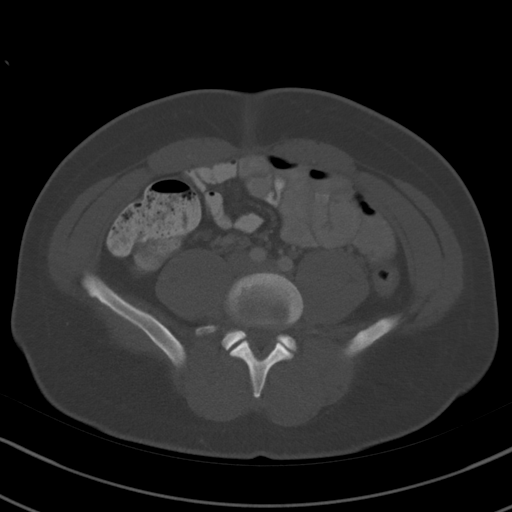
[im 8/54  soft-tissue]
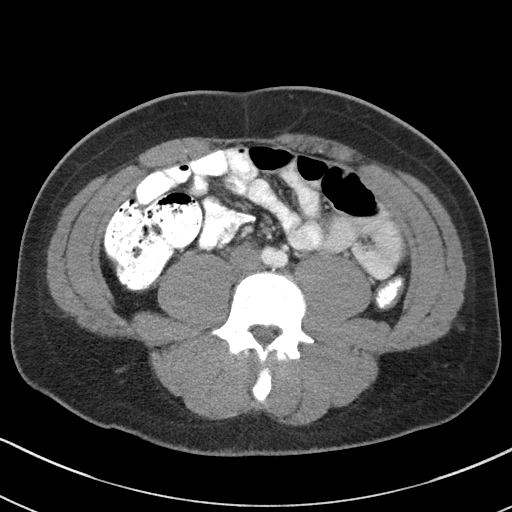
[im 12/54  soft-tissue]
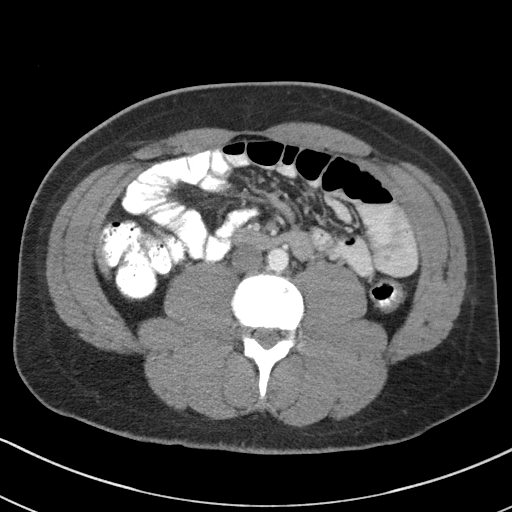
[im 16/54  soft-tissue]
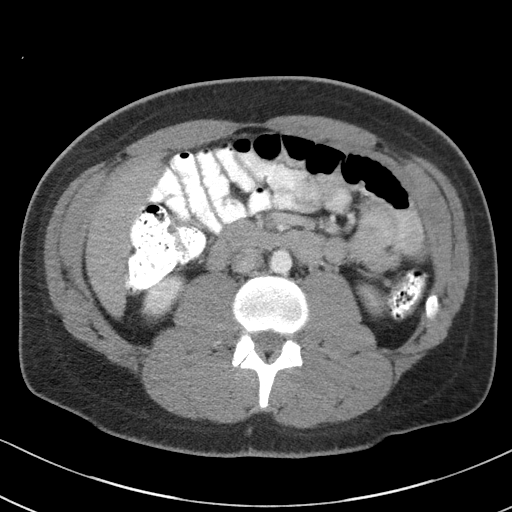
[im 19/54  soft-tissue]
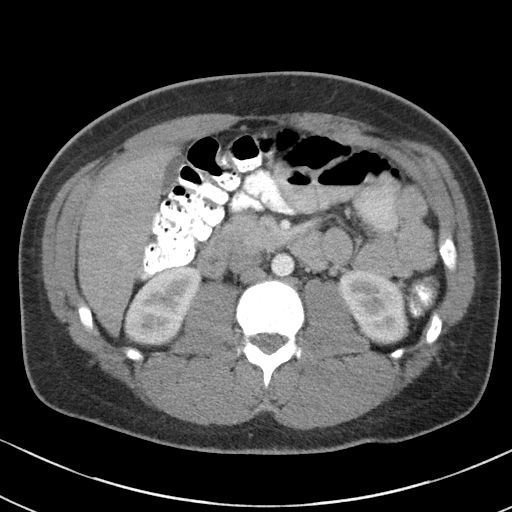
[im 23/54  soft-tissue]
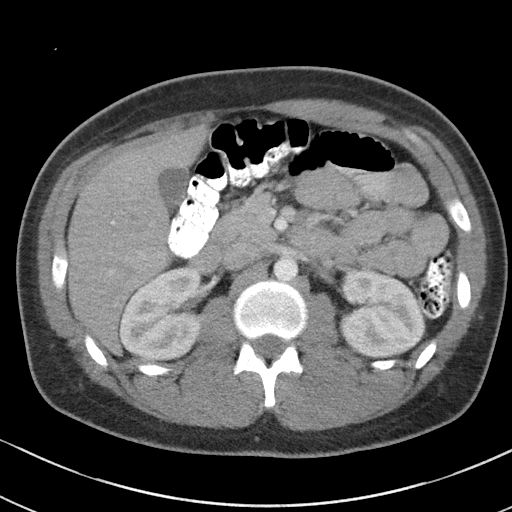
[im 27/54  soft-tissue]
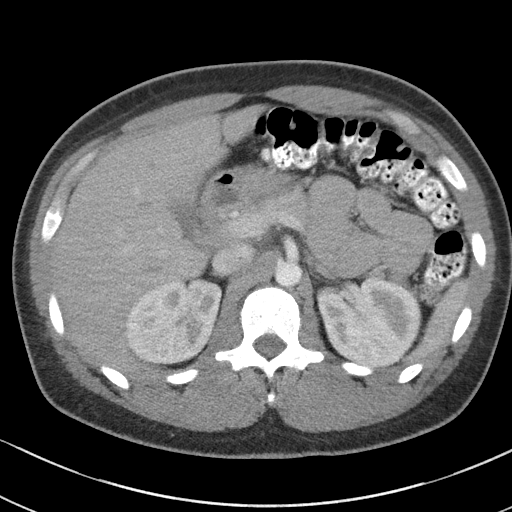
[im 31/54  soft-tissue]
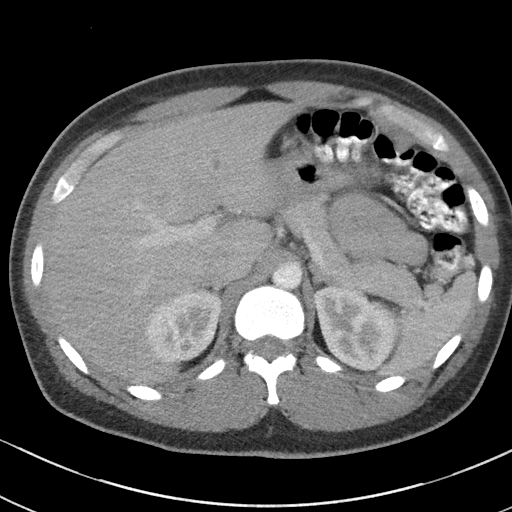
[im 35/54  soft-tissue]
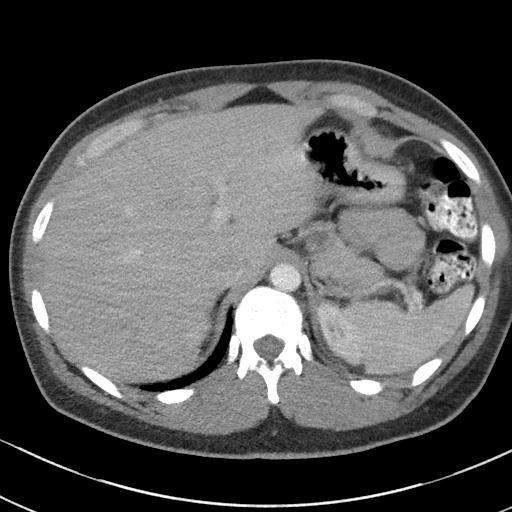
[im 35/54  bone]
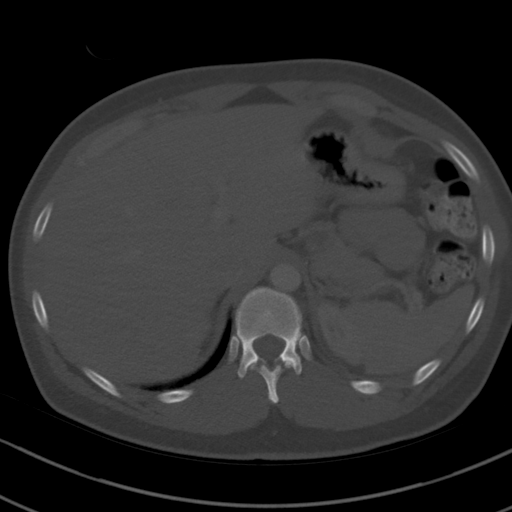
[im 38/54  soft-tissue]
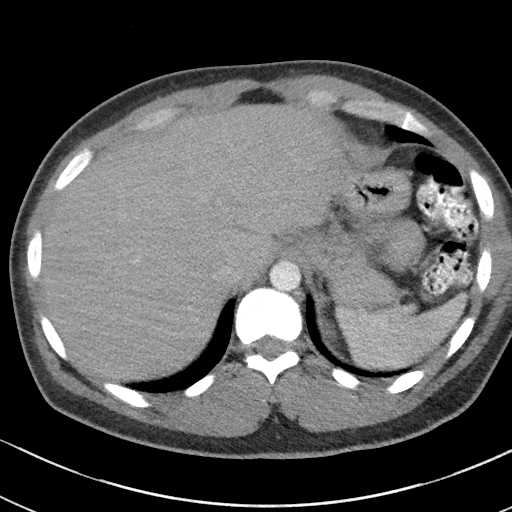
[im 42/54  soft-tissue]
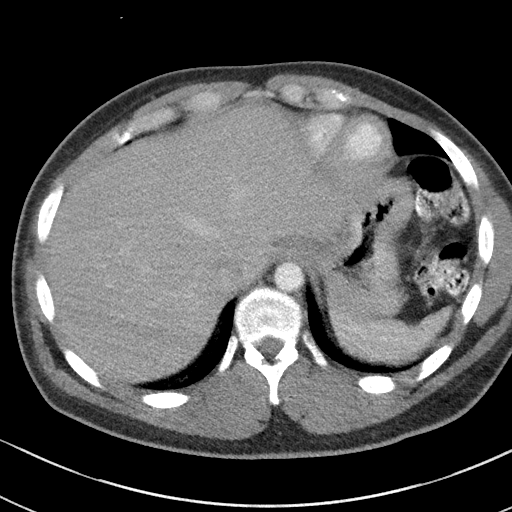
[im 46/54  soft-tissue]
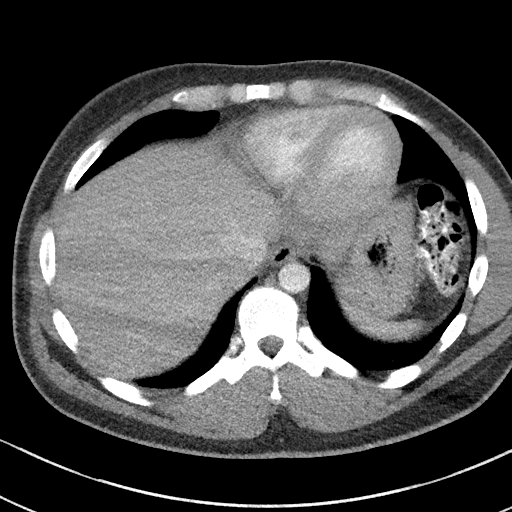
[im 50/54  soft-tissue]
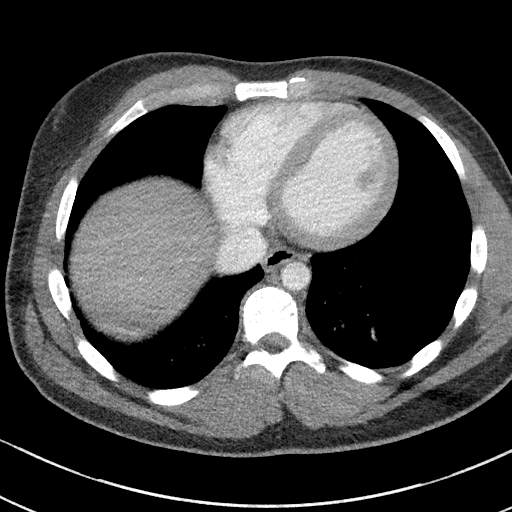

[Series 5: coronal st · coronal · 0.51mm/px · 3 of 82 slices shown]
[im 28/82  soft-tissue]
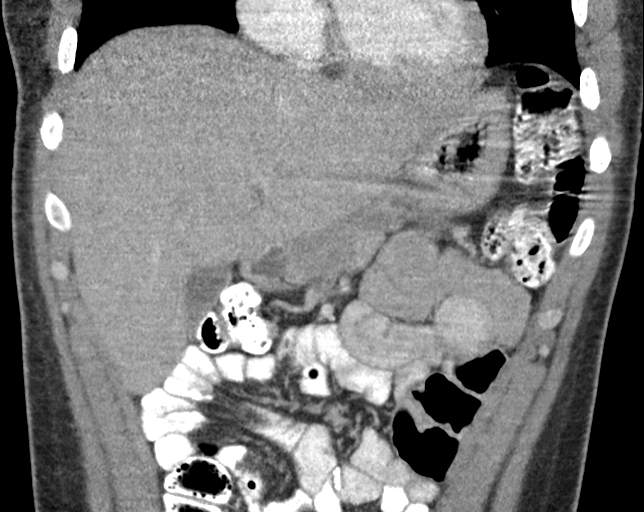
[im 37/82  soft-tissue]
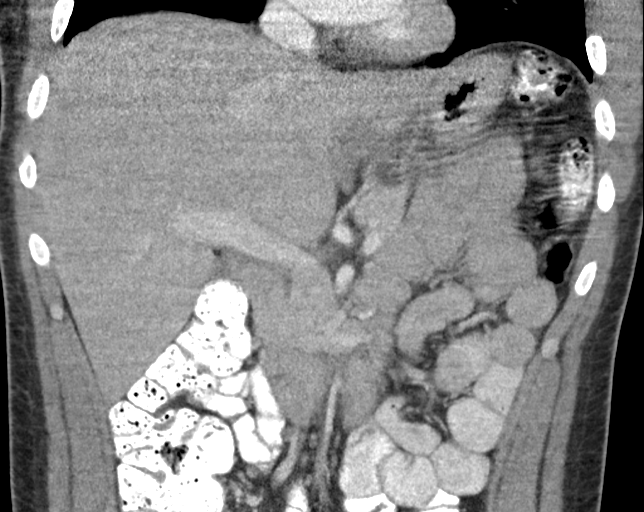
[im 46/82  soft-tissue]
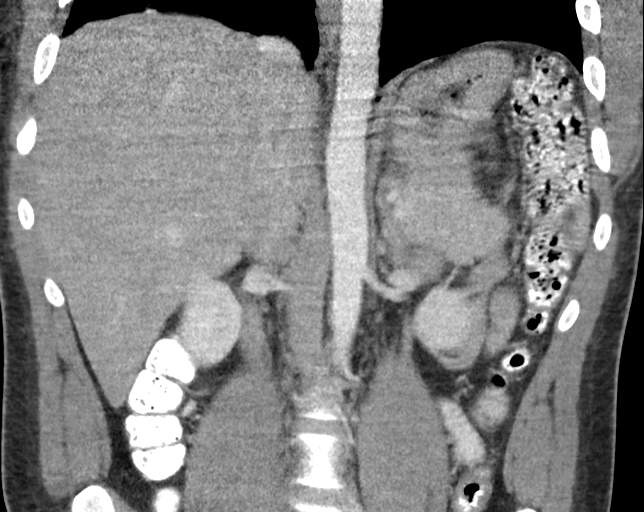

[16 of 46 positions shown; findings below may reference images not displayed]

FINDINGS: Lower chest: No acute abnormality.

Hepatobiliary: No focal liver abnormality identified. Gallbladder is
unremarkable. No biliary dilatation.

Pancreas: Unremarkable. No pancreatic ductal dilatation or
surrounding inflammatory changes.

Spleen: Normal in size without focal abnormality.

Adrenals/Urinary Tract: Normal appearance of the adrenal glands. The
kidneys are unremarkable. No mass or hydronephrosis identified.

Stomach/Bowel: The stomach is normal. The small bowel loops have a
normal course and caliber without evidence for bowel obstruction.
The appendix is not visualized. No colonic wall thickening,
inflammation or distension.

Vascular/Lymphatic: Normal appearance of the abdominal aorta. No
adenopathy identified within the abdomen.

Other: No free fluid or fluid collections within the upper abdomen.

Musculoskeletal: No acute or significant osseous findings.
IMPRESSION: 1. No acute findings within the abdomen. No mass or adenopathy
identified to explain patient's loss of weight.

## 2021-05-10 ENCOUNTER — Other Ambulatory Visit: Payer: Self-pay

## 2021-05-10 ENCOUNTER — Encounter: Payer: Self-pay | Admitting: Family Medicine

## 2021-05-10 ENCOUNTER — Ambulatory Visit (INDEPENDENT_AMBULATORY_CARE_PROVIDER_SITE_OTHER): Payer: Medicaid Other | Admitting: Family Medicine

## 2021-05-10 VITALS — BP 135/88 | HR 82 | Wt 219.6 lb

## 2021-05-10 DIAGNOSIS — E663 Overweight: Secondary | ICD-10-CM

## 2021-05-10 DIAGNOSIS — Z0000 Encounter for general adult medical examination without abnormal findings: Secondary | ICD-10-CM

## 2021-05-10 DIAGNOSIS — D721 Eosinophilia, unspecified: Secondary | ICD-10-CM | POA: Diagnosis not present

## 2021-05-10 DIAGNOSIS — J302 Other seasonal allergic rhinitis: Secondary | ICD-10-CM | POA: Diagnosis not present

## 2021-05-10 MED ORDER — FLUTICASONE PROPIONATE 50 MCG/ACT NA SUSP: 2.0000 | Freq: Every day | NASAL | 6 refills | Status: DC

## 2021-05-10 NOTE — Assessment & Plan Note (Signed)
Refilled Flonase discussed management. ?

## 2021-05-10 NOTE — Progress Notes (Signed)
? ? ?  SUBJECTIVE:  ? ?Chief compliant/HPI: annual examination ? ?Stephen Frank is a 31 y.o. who presents today for an annual exam.  ? ?Headaches- improved, follows with Neurology.  ? ?Family history is updated his grandfather had colorectal cancer in his 92s.  His mom recently had a colonoscopy which demonstrated benign polyp. ? ?Socially the patient is sex active with 2 male partners.  Both are HIV negative and he knows their status.  He does continue to use cannabis ? ?Review of systems form notable for none.  ? ?Updated history tabs and problem list .  ? ?OBJECTIVE:  ? ?BP 135/88   Pulse 82   Wt 219 lb 9.6 oz (99.6 kg)   SpO2 100%   BMI 29.78 kg/m?   ?HEENT: EOMI. Sclera without injection or icterus. MMM. External auditory canal examined and WNL. TM normal appearance, no erythema or bulging. ?Neck: No thyromegaly.  ?Cardiac: Regular rate and rhythm. Normal S1/S2. No murmurs, rubs, or gallops appreciated. ?Lungs: Clear bilaterally to ascultation.  ?Abdomen: Normoactive bowel sounds. No tenderness to deep or light palpation. No rebound or guarding.  ?Neuro:Normal speech  ?Ext: No edema  ?Psych: Pleasant and appropriate  ? ? ?ASSESSMENT/PLAN:  ? ?Seasonal allergic rhinitis ?Refilled Flonase discussed management. ?  ? ?Annual Examination  ?See AVS for age appropriate recommendations.  ? ?PHQ score 0, reviewed and discussed. ?Blood pressure reviewed and at goal yes .  ? ? ?Considered the following items based upon USPSTF recommendations: ?HIV testing: ordered ?Hepatitis C: ordered ?Hepatitis B: ordered ?Syphilis if at high risk: discussed ?GC/CT Declined  ?Lipid panel (nonfasting or fasting) discussed based upon AHA recommendations and ordered.  Consider repeat every 4-6 years.  ?Reviewed risk factors for latent tuberculosis and not indicated ? ?Follow up in 1  year or sooner if indicated.  ? ? ?Martyn Malay, MD ?[place]  ? ?

## 2021-05-10 NOTE — Assessment & Plan Note (Addendum)
Likely due to allergies.  Continue to monitor. Relatively mild elevation, no systemic symptoms.  ?

## 2021-05-10 NOTE — Patient Instructions (Addendum)
It was wonderful to see you today. ? ?Please bring ALL of your medications with you to every visit.  ? ?Today we talked about: ?--Reducing cannabis use ?- Going to the lab to get blood work checked ?- I will message you with results ? ? ? ?Thank you for choosing [facility].  ? ?Please call [phone] with any questions about today's appointment. ? ?Please be sure to schedule follow up at the front  desk before you leave today.  ? ?Dorris Singh, MD  ?Family Medicine  ? ?

## 2021-05-11 ENCOUNTER — Encounter: Payer: Self-pay | Admitting: Family Medicine

## 2021-05-11 ENCOUNTER — Other Ambulatory Visit: Payer: Medicaid Other

## 2021-05-11 LAB — LIPID PANEL
Chol/HDL Ratio: 4.7 ratio (ref 0.0–5.0)
Cholesterol, Total: 203 mg/dL — ABNORMAL HIGH (ref 100–199)
HDL: 43 mg/dL (ref >39)
LDL Chol Calc (NIH): 146 mg/dL — ABNORMAL HIGH (ref 0–99)
Triglycerides: 76 mg/dL (ref 0–149)
VLDL Cholesterol Cal: 14 mg/dL (ref 5–40)

## 2021-05-11 LAB — RPR: RPR Ser Ql: NONREACTIVE

## 2021-05-11 LAB — HCV INTERPRETATION

## 2021-05-11 LAB — HCV AB W REFLEX TO QUANT PCR: HCV Ab: NONREACTIVE

## 2021-05-11 LAB — HIV ANTIBODY (ROUTINE TESTING W REFLEX): HIV Screen 4th Generation wRfx: NONREACTIVE

## 2021-05-12 ENCOUNTER — Other Ambulatory Visit: Payer: Medicaid Other

## 2021-06-28 ENCOUNTER — Encounter: Payer: Self-pay | Admitting: Neurology

## 2021-06-28 ENCOUNTER — Telehealth: Payer: No Typology Code available for payment source | Admitting: Neurology

## 2021-06-28 NOTE — Progress Notes (Deleted)
[facility]    Provider:  Dr Jaynee Eagles Requesting Provider: Martyn Malay, MD Primary Care Provider:  Martyn Malay, MD  CC:  Migraines  HPI 02/26/2021:  Stephen Frank is a 31 y.o. male here as requested by Martyn Malay, MD for chronic headache 4-5 years.   Headaches started 5+ years ago. 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 He does take energy supplements, discussed the risks including strokes, headaches of taking energy drinks. Tylenol, excedrin may help a little no medication overuse. SOund, light are triggers. Behind the eyes, eye pain. 8 migraine days a month moderate to severe. Worsening. Other days has milder 4/10 headaches. 15 headache days a month total. No other focal neurologic deficits, associated symptoms, inciting events or modifiable factors.   Reviewed notes, labs and imaging from outside physicians, which showed:   From a thorough review of records, Medications tried that can be used in headache/migraine management include: Tylenol, amitriptyline, Norvasc (calcium channel blocker similar to verapamil), Flexeril, Voltaren, Advil, Toradol injections, Mobic tablets, methocarbamol tablets, Reglan injections, naproxen, promethazine injections,imitrex, topamax(tried as a child so likely had migraines).   XR cervical 04/02/2019: personally reviewed images and agree FINDINGS: There is no evidence of cervical spine fracture or prevertebral soft tissue swelling. Alignment is  normal. No other significant bone abnormalities are identified.   IMPRESSION: Negative cervical spine radiographs.     Review of Systems: Patient complains of symptoms per HPI as well as the following symptoms headache. Pertinent negatives and positives per HPI. All others negative.   Social History   Socioeconomic History   Marital status: Single    Spouse name: Not on file   Number of children: Not on file   Years of education: 12 +   Highest education level: Not on file  Occupational History   Occupation: Truck Education administrator: [employer]  Tobacco Use   Smoking status: Former    Packs/day: 1.00    Types: Cigarettes    Start date: 08/25/2014   Smokeless tobacco: Never  Vaping Use   Vaping Use: Never used  Substance and Sexual Activity   Alcohol use: No    Alcohol/week: 0.0 standard drinks   Drug use: Yes    Types: Marijuana    Comment: twice  to three times per week.  Last smoked 10-19-2013   Sexual activity: Yes    Birth control/protection: Condom  Other Topics Concern   Not on file  Social History Narrative   Single, truck driver Engineer, agricultural)   R handed   Caffeine: 1 energy drink daily or every other day    Previously incarcerated    Smoker - marijuana   No EtOH, other drugs   Social Determinants of Radio broadcast assistant Strain: Not on file  Food Insecurity: Not on file  Transportation Needs: Not on file  Physical Activity: Not on file  Stress: Not on file  Social Connections: Not on file  Intimate Partner Violence: Not  on file    Family History  Problem Relation Age of Onset   Diabetes Mother    Hypertension Mother    Colon cancer Maternal Grandfather    Hyperlipidemia Maternal Grandfather    Diabetes Maternal Grandfather    Pancreatic cancer Maternal Grandfather    Diabetes Maternal Grandmother    Esophageal cancer Neg Hx    Rectal cancer Neg Hx    Stomach cancer Neg Hx    Prostate cancer Neg Hx     Past Medical  History:  Diagnosis Date   ADHD (attention deficit hyperactivity disorder)    Allergy    Asthma    Bipolar disorder ([place])    GERD (gastroesophageal reflux disease) 04/25/2012   H/O chlamydia infection    History of seasonal allergies    Hypertension     Patient Active Problem List   Diagnosis Date Noted   Chronic migraine without aura without status migrainosus, not intractable 02/26/2021   Eosinophilia 07/22/2019   Cannabis use, uncomplicated 16/10/9602   Tobacco abuse 03/13/2017   Seasonal allergic rhinitis 05/08/2014   Bipolar disorder ([place]) 12/14/2011    Past Surgical History:  Procedure Laterality Date   ESOPHAGOGASTRODUODENOSCOPY     TONSILLECTOMY AND ADENOIDECTOMY      Current Outpatient Medications  Medication Sig Dispense Refill   amitriptyline (ELAVIL) 25 MG tablet Take 1 tablet (25 mg total) by mouth at bedtime. 60 tablet 2   aspirin-acetaminophen-caffeine (EXCEDRIN MIGRAINE) 540-981-19 MG per tablet Take 2 tablets by mouth every 6 (six) hours as needed for headache.     fluticasone (FLONASE) 50 MCG/ACT nasal spray Place 2 sprays into both nostrils daily. 16 g 6   Fremanezumab-vfrm (AJOVY) 225 MG/1.5ML SOAJ Inject 225 mg into the skin every 30 (thirty) days. 1.5 mL 11   rizatriptan (MAXALT-MLT) 10 MG disintegrating tablet Take 1 tablet (10 mg total) by mouth as needed for migraine. May repeat in 2 hours if needed 9 tablet 11   No current facility-administered medications for this visit.    Allergies as of 06/28/2021 - Review Complete 05/10/2021  Allergen Reaction Noted   Bee venom Hives and Swelling 09/01/2015   Soy allergy  08/26/2013    Vitals: There were no vitals taken for this visit. Last Weight:  Wt Readings from Last 1 Encounters:  05/10/21 219 lb 9.6 oz (99.6 kg)   Last Height:   Ht Readings from Last 1 Encounters:  02/26/21 6' (1.829 m)     Physical exam: Exam: Gen: NAD, conversant, well nourised, overweight, well groomed                      CV: RRR, no MRG. No Carotid Bruits. No peripheral edema, warm, nontender Eyes: Conjunctivae clear without exudates or hemorrhage  Neuro: Detailed Neurologic Exam  Speech:    Speech is normal; fluent and spontaneous with normal comprehension.  Cognition:    The patient is oriented to person, place, and time;     recent and remote memory intact;     language fluent;     normal attention, concentration,     fund of knowledge Cranial Nerves:    The pupils are equal, round, and reactive to light. The fundi are flat. Visual fields are full to finger confrontation. Extraocular movements are intact. Trigeminal sensation is intact and the muscles of mastication are normal. The face is symmetric. The palate elevates in the midline. Hearing intact. Voice is normal. Shoulder shrug is normal. The tongue has normal motion  without fasciculations.   Coordination:    Normal    Gait:     normal.   Motor Observation:    No asymmetry, no atrophy, and no involuntary movements noted. Tone:    Normal muscle tone.    Posture:    Posture is normal. normal erect    Strength:    Strength is V/V in the upper and lower limbs.      Sensation: intact to LT     Reflex Exam:  DTR's:    Deep tendon reflexes in the upper and lower extremities are normal bilaterally.   Toes:    The toes are downgoing bilaterally.   Clonus:    Clonus is absent.    Assessment/Plan:  31 year old with chronic migraines. Has tried and failed multiple medications over the years. Worsening. Needs thorough examination.  MRI brain due to concerning symptoms of morning headaches, positional headaches,vision changes, worsening headaches to look for space occupying mass, chiari or intracranial hypertension (pseudotumor), stroke, demyelination or other.   Patient instructions:  MRI of the brain w/wo contrast Prevention: Ajovy (trained in the office on injection use) As needed: Rizatriptan for acute/emergent use: Please take  one tablet at the onset of your headache. If it does not improve the symptoms please take one additional tablet in 2 hours. Do not take more then 2 tablets in 24hrs. Do not take use more then 2 to 3 times in a week.   No orders of the defined types were placed in this encounter.  No orders of the defined types were placed in this encounter.   Cc: Martyn Malay, MD,  Martyn Malay, MD  [name], MD  [facility] [address] [place], [place] [address]  Phone [phone] Fax [phone]

## 2021-06-29 ENCOUNTER — Encounter: Payer: Self-pay | Admitting: *Deleted

## 2021-06-30 ENCOUNTER — Other Ambulatory Visit: Payer: Medicaid Other

## 2021-06-30 DIAGNOSIS — Z0000 Encounter for general adult medical examination without abnormal findings: Secondary | ICD-10-CM

## 2021-07-01 LAB — HEMOGLOBIN A1C
Est. average glucose Bld gHb Est-mCnc: 126 mg/dL
Hgb A1c MFr Bld: 6 % — ABNORMAL HIGH (ref 4.8–5.6)

## 2021-07-07 ENCOUNTER — Encounter: Payer: Self-pay | Admitting: Family Medicine

## 2021-07-07 ENCOUNTER — Ambulatory Visit (INDEPENDENT_AMBULATORY_CARE_PROVIDER_SITE_OTHER): Payer: Medicaid Other | Admitting: Family Medicine

## 2021-07-07 ENCOUNTER — Other Ambulatory Visit (HOSPITAL_COMMUNITY)
Admission: RE | Admit: 2021-07-07 | Discharge: 2021-07-07 | Disposition: A | Discharge status: Home / Self Care | Payer: Medicaid Other | Source: Ambulatory Visit | Attending: Family Medicine | Admitting: Family Medicine

## 2021-07-07 VITALS — BP 138/89 | HR 87 | Ht 72.0 in | Wt 216.0 lb

## 2021-07-07 DIAGNOSIS — M7918 Myalgia, other site: Secondary | ICD-10-CM

## 2021-07-07 DIAGNOSIS — Z113 Encounter for screening for infections with a predominantly sexual mode of transmission: Secondary | ICD-10-CM | POA: Diagnosis present

## 2021-07-07 NOTE — Progress Notes (Signed)
    SUBJECTIVE:   CHIEF COMPLAINT / HPI:   Patient willing to get STD tested.  He notes that he has had 2 new male partners in the recent months.  He states he uses a condom but wanted to make sure that he did not have any other exposures or anything.  He is asymptomatic  Patient is reports concerns regarding a chest pain that he has in the left side.  He notes that it is intermittent, typically associated with certain physical activities that are weighted including push-ups and rowing movements.  There is not a significant amount of pain whenever he is pushing on it but sometimes he can elicit that pain.  Denies any shortness of breath, diaphoresis, nausea/vomiting or radiation of the pain during these episodes.  He was worried that gynecomastia was causing this problem.  PERTINENT  PMH / PSH: Reviewed  OBJECTIVE:   BP 138/89   Pulse 87   Ht 6' (1.829 m)   Wt 216 lb (98 kg)   SpO2 100%   BMI 29.29 kg/m   General: NAD, well-appearing, well-nourished Respiratory: No respiratory distress, breathing comfortably, able to speak in full sentences Skin: warm and dry, no rashes noted on exposed skin Psych: Appropriate affect and mood Chest: left chest non-tender to palpation, mild b/l gynecomastia present, some pain elicited with throwing movement of the left arm  ASSESSMENT/PLAN:   Left chest pain, likely musculoskeletal in etiology Physical examination and history less concerning for cardiac etiology, likely related to muscular. - Recommended patient monitor which movements/positions are aggravating this pain - Tylenol ibuprofen as needed - Gentle stretching exercises and limiting heavy weights until improvement - ER precautions for chest pain   STD screening -Urine gonorrhea/chlamydia ordered - HIV and RPR ordered  Rise Patience, [place]

## 2021-07-07 NOTE — Patient Instructions (Signed)
I think the pain in your chest is related to a muscle.  I would keep an eye on your activity and see what movements irritate it.  You can try over-the-counter creams to see if that helps area, he also use Tylenol ibuprofen to see if it improves as well.  Regarding the STD screening tests for you today, I will let you know if any of the results are abnormal through MyChart.

## 2021-07-08 LAB — HIV ANTIBODY (ROUTINE TESTING W REFLEX): HIV Screen 4th Generation wRfx: NONREACTIVE

## 2021-07-08 LAB — RPR: RPR Ser Ql: NONREACTIVE

## 2021-07-09 ENCOUNTER — Encounter: Payer: Self-pay | Admitting: Family Medicine

## 2021-07-09 LAB — URINE CYTOLOGY ANCILLARY ONLY
Chlamydia: NEGATIVE
Comment: NEGATIVE
Comment: NEGATIVE
Comment: NORMAL
Neisseria Gonorrhea: NEGATIVE
Trichomonas: NEGATIVE

## 2021-09-09 ENCOUNTER — Ambulatory Visit
Admission: EM | Admit: 2021-09-09 | Discharge: 2021-09-09 | Disposition: A | Discharge status: Home / Self Care | Payer: Medicaid Other | Attending: Internal Medicine | Admitting: Internal Medicine

## 2021-09-09 ENCOUNTER — Encounter: Payer: Self-pay | Admitting: Emergency Medicine

## 2021-09-09 DIAGNOSIS — U071 COVID-19: Secondary | ICD-10-CM | POA: Diagnosis not present

## 2021-09-09 DIAGNOSIS — J069 Acute upper respiratory infection, unspecified: Secondary | ICD-10-CM | POA: Insufficient documentation

## 2021-09-09 DIAGNOSIS — R059 Cough, unspecified: Secondary | ICD-10-CM | POA: Diagnosis not present

## 2021-09-09 DIAGNOSIS — Z79899 Other long term (current) drug therapy: Secondary | ICD-10-CM | POA: Diagnosis not present

## 2021-09-09 LAB — SARS CORONAVIRUS 2 BY RT PCR: SARS Coronavirus 2 by RT PCR: POSITIVE — AB

## 2021-09-09 MED ORDER — FLUTICASONE PROPIONATE 50 MCG/ACT NA SUSP: 1.0000 | Freq: Every day | NASAL | 0 refills | Status: DC

## 2021-09-09 MED ORDER — GUAIFENESIN 200 MG PO TABS: 200.0000 mg | ORAL_TABLET | ORAL | 0 refills | Status: DC | PRN

## 2021-09-09 NOTE — Discharge Instructions (Signed)
It appears that you have a viral illness that should run its course and self resolve with symptomatic treatment.  You have been prescribed medications to help alleviate symptoms.  COVID test is pending.  We will call if it is positive.

## 2021-09-09 NOTE — ED Provider Notes (Signed)
[facility]    CSN: [id number] Arrival date & time: 09/09/21  9024      History   Chief Complaint Chief Complaint  Patient presents with   Cough    Chest congestion   Nasal Congestion   Generalized Body Aches   Chills    HPI Stephen Frank is a 31 y.o. male.   Patient presents with cough, nasal congestion, feelings of chest congestion, generalized body aches, chills that started about 3 days ago.  Denies any known fevers at home.  Patient reports that his mother recently had COVID-19 so he is concerned for this and requesting COVID test.  Denies chest pain, shortness of breath, sore throat, ear pain, nausea, vomiting, diarrhea, abdominal pain.  Patient reports that he has taken "over-the-counter medications" with minimal improvement in symptoms.   Cough   Past Medical History:  Diagnosis Date   ADHD (attention deficit hyperactivity disorder)    Allergy    Asthma    Bipolar disorder ([place])    GERD (gastroesophageal reflux disease) 04/25/2012   H/O chlamydia infection    History of seasonal allergies    Hypertension     Patient Active Problem List   Diagnosis Date Noted   Chronic migraine without aura without status migrainosus, not intractable 02/26/2021   Eosinophilia 07/22/2019   Cannabis use, uncomplicated 09/73/5329   Tobacco abuse 03/13/2017   Seasonal allergic rhinitis 05/08/2014   Bipolar disorder ([place]) 12/14/2011    Past Surgical History:  Procedure Laterality Date   ESOPHAGOGASTRODUODENOSCOPY     TONSILLECTOMY AND ADENOIDECTOMY         Home Medications    Prior to Admission medications   Medication Sig Start Date End Date Taking? Authorizing Provider  fluticasone (FLONASE) 50 MCG/ACT nasal spray Place 1 spray into both nostrils daily for 3 days. 09/09/21 09/12/21 Yes Kristyn Obyrne, Michele Rockers, FNP  guaiFENesin 200 MG tablet Take 1 tablet (200 mg total) by mouth every 4 (four) hours as needed for cough or to loosen phlegm. 09/09/21  Yes Estiven Kohan,  Michele Rockers, FNP  amitriptyline (ELAVIL) 25 MG tablet Take 1 tablet (25 mg total) by mouth at bedtime. 11/01/19   Matilde Haymaker, MD  aspirin-acetaminophen-caffeine (EXCEDRIN MIGRAINE) [id number] MG per tablet Take 2 tablets by mouth every 6 (six) hours as needed for headache.    [provider]  Fremanezumab-vfrm (AJOVY) 225 MG/1.5ML SOAJ Inject 225 mg into the skin every 30 (thirty) days. 02/26/21   Melvenia Beam, MD  rizatriptan (MAXALT-MLT) 10 MG disintegrating tablet Take 1 tablet (10 mg total) by mouth as needed for migraine. May repeat in 2 hours if needed 02/26/21   Melvenia Beam, MD    Family History Family History  Problem Relation Age of Onset   Diabetes Mother    Hypertension Mother    Colon cancer Maternal Grandfather    Hyperlipidemia Maternal Grandfather    Diabetes Maternal Grandfather    Pancreatic cancer Maternal Grandfather    Diabetes Maternal Grandmother    Esophageal cancer Neg Hx    Rectal cancer Neg Hx    Stomach cancer Neg Hx    Prostate cancer Neg Hx     Social History Social History   Tobacco Use   Smoking status: Former    Packs/day: 1.00    Types: Cigarettes    Start date: 08/25/2014   Smokeless tobacco: Never  Vaping Use   Vaping Use: Never used  Substance Use Topics   Alcohol use: No  Alcohol/week: 0.0 standard drinks of alcohol   Drug use: Yes    Types: Marijuana    Comment: twice  to three times per week.  Last smoked 10-19-2013     Allergies   Bee venom and Soy allergy   Review of Systems Review of Systems Per HPI  Physical Exam Triage Vital Signs ED Triage Vitals  Enc Vitals Group     BP 09/09/21 1007 (!) 134/90     Pulse Rate 09/09/21 1007 69     Resp 09/09/21 1007 18     Temp 09/09/21 1007 98.1 F (36.7 C)     Temp src --      SpO2 09/09/21 1007 98 %     Weight --      Height --      Head Circumference --      Peak Flow --      Pain Score 09/09/21 1009 7     Pain Loc --      Pain Edu? --      Excl. in [place]? --     No data found.  Updated Vital Signs BP (!) 134/90   Pulse 69   Temp 98.1 F (36.7 C)   Resp 18   SpO2 98%   Visual Acuity Right Eye Distance:   Left Eye Distance:   Bilateral Distance:    Right Eye Near:   Left Eye Near:    Bilateral Near:     Physical Exam Constitutional:      General: He is not in acute distress.    Appearance: Normal appearance. He is not toxic-appearing or diaphoretic.  HENT:     Head: Normocephalic and atraumatic.     Right Ear: Tympanic membrane and ear canal normal.     Left Ear: Tympanic membrane and ear canal normal.     Nose: Congestion present.     Mouth/Throat:     Mouth: Mucous membranes are moist.     Pharynx: No posterior oropharyngeal erythema.  Eyes:     Extraocular Movements: Extraocular movements intact.     Conjunctiva/sclera: Conjunctivae normal.     Pupils: Pupils are equal, round, and reactive to light.  Cardiovascular:     Rate and Rhythm: Normal rate and regular rhythm.     Pulses: Normal pulses.     Heart sounds: Normal heart sounds.  Pulmonary:     Effort: Pulmonary effort is normal. No respiratory distress.     Breath sounds: Normal breath sounds. No stridor. No wheezing, rhonchi or rales.  Abdominal:     General: Abdomen is flat. Bowel sounds are normal.     Palpations: Abdomen is soft.  Musculoskeletal:        General: Normal range of motion.     Cervical back: Normal range of motion.  Skin:    General: Skin is warm and dry.  Neurological:     General: No focal deficit present.     Mental Status: He is alert and oriented to person, place, and time. Mental status is at baseline.  Psychiatric:        Mood and Affect: Mood normal.        Behavior: Behavior normal.      UC Treatments / Results  Labs (all labs ordered are listed, but only abnormal results are displayed) Labs Reviewed  SARS CORONAVIRUS 2 BY RT PCR    EKG   Radiology No results found.  Procedures Procedures (including critical care  time)  Medications Ordered in  UC Medications - No data to display  Initial Impression / Assessment and Plan / UC Course  I have reviewed the triage vital signs and the nursing notes.  Pertinent labs & imaging results that were available during my care of the patient were reviewed by me and considered in my medical decision making (see chart for details).     Patient presents with symptoms likely from a viral upper respiratory infection. Differential includes bacterial pneumonia, sinusitis, allergic rhinitis, covid 19, flu. Do not suspect underlying cardiopulmonary process. Symptoms seem unlikely related to ACS, CHF or COPD exacerbations, pneumonia, pneumothorax. Patient is nontoxic appearing and not in need of emergent medical intervention.  Covid 19 test pending.  Recommended symptom control with over the counter medications: Daily oral anti-histamine, Oral decongestant or IN corticosteroid, saline irrigations, cepacol lozenges, Robitussin, Delsym, honey tea.  If COVID test is positive and patient is within 5-day treatment window, he would qualify for Paxlovid OR molnupiravir given most recent GFR is normal.  Return if symptoms fail to improve in 1-2 weeks or you develop shortness of breath, chest pain, severe headache. Patient states understanding and is agreeable.  Discharged with PCP followup.  Final Clinical Impressions(s) / UC Diagnoses   Final diagnoses:  Viral upper respiratory tract infection with cough     Discharge Instructions      It appears that you have a viral illness that should run its course and self resolve with symptomatic treatment.  You have been prescribed medications to help alleviate symptoms.  COVID test is pending.  We will call if it is positive.    ED Prescriptions     Medication Sig Dispense Auth. Provider   guaiFENesin 200 MG tablet Take 1 tablet (200 mg total) by mouth every 4 (four) hours as needed for cough or to loosen phlegm. 30 suppository  Kala Ambriz, [place] E, [place]   fluticasone [facility]) 50 MCG/ACT nasal spray Place 1 spray into both nostrils daily for 3 days. 16 g Teodora Medici, [place]      PDMP not reviewed this encounter.   Teodora Medici, [place] 09/09/21 1031

## 2021-09-09 NOTE — ED Triage Notes (Signed)
Pt is present today with c/o cough, chest congestion, nasal congestion, body aches, and chills. Pt sx started Tuesday

## 2021-09-10 ENCOUNTER — Telehealth (HOSPITAL_COMMUNITY): Payer: Self-pay | Admitting: Emergency Medicine

## 2021-09-10 ENCOUNTER — Encounter: Payer: Self-pay | Admitting: Family Medicine

## 2021-09-10 ENCOUNTER — Telehealth: Payer: Self-pay | Admitting: Family Medicine

## 2021-09-10 DIAGNOSIS — U071 COVID-19: Secondary | ICD-10-CM

## 2021-09-10 MED ORDER — PAXLOVID (300/100) 20 X 150 MG & 10 X 100MG PO TBPK: ORAL_TABLET | ORAL | 0 refills | Status: DC

## 2021-09-10 NOTE — Telephone Encounter (Signed)
Attempted to call patient. Reached voicemail, left generic voicemail to call back. If calls back please let him know I sent Paxlovid to his pharmacy. Nausea is greatest side effect. He should push fluids. He should return to the ED if he develops chest pain, worsening dyspnea, or other symptoms.    Dorris Singh, MD  Family Medicine Teaching Service

## 2022-01-24 ENCOUNTER — Encounter (HOSPITAL_BASED_OUTPATIENT_CLINIC_OR_DEPARTMENT_OTHER): Payer: Self-pay

## 2022-01-24 ENCOUNTER — Other Ambulatory Visit: Payer: Self-pay

## 2022-01-24 ENCOUNTER — Emergency Department (HOSPITAL_BASED_OUTPATIENT_CLINIC_OR_DEPARTMENT_OTHER)
Admission: EM | Admit: 2022-01-24 | Discharge: 2022-01-24 | Disposition: A | Discharge status: Home / Self Care | Payer: Medicaid Other | Attending: Emergency Medicine | Admitting: Emergency Medicine

## 2022-01-24 DIAGNOSIS — Y93B9 Activity, other involving muscle strengthening exercises: Secondary | ICD-10-CM | POA: Diagnosis not present

## 2022-01-24 DIAGNOSIS — M545 Low back pain, unspecified: Secondary | ICD-10-CM | POA: Diagnosis present

## 2022-01-24 DIAGNOSIS — X58XXXA Exposure to other specified factors, initial encounter: Secondary | ICD-10-CM | POA: Insufficient documentation

## 2022-01-24 DIAGNOSIS — M5432 Sciatica, left side: Secondary | ICD-10-CM

## 2022-01-24 DIAGNOSIS — Z7982 Long term (current) use of aspirin: Secondary | ICD-10-CM | POA: Insufficient documentation

## 2022-01-24 DIAGNOSIS — S39012A Strain of muscle, fascia and tendon of lower back, initial encounter: Secondary | ICD-10-CM

## 2022-01-24 DIAGNOSIS — M5442 Lumbago with sciatica, left side: Secondary | ICD-10-CM | POA: Insufficient documentation

## 2022-01-24 MED ORDER — ACETAMINOPHEN 500 MG PO TABS: 500.0000 mg | ORAL_TABLET | Freq: Four times a day (QID) | ORAL | 1 refills | Status: DC | PRN

## 2022-01-24 MED ORDER — IBUPROFEN 400 MG PO TABS: 600.0000 mg | ORAL_TABLET | Freq: Four times a day (QID) | ORAL | Status: DC

## 2022-01-24 MED ORDER — METHOCARBAMOL 500 MG PO TABS: 500.0000 mg | ORAL_TABLET | Freq: Two times a day (BID) | ORAL | 0 refills | Status: DC

## 2022-01-24 MED ORDER — NAPROXEN 375 MG PO TABS: 375.0000 mg | ORAL_TABLET | Freq: Two times a day (BID) | ORAL | 0 refills | Status: DC

## 2022-01-24 NOTE — ED Notes (Signed)
Discharge paperwork given and verbally understood. 

## 2022-01-24 NOTE — ED Triage Notes (Signed)
Onset of two weeks left lower back radiating down left leg.  States has been treated with muscle relaxant and naproxen  ambulatory to triage without difficulty No known injury

## 2022-01-24 NOTE — Discharge Instructions (Addendum)
Please use Tylenol or ibuprofen for pain.  You may use 600 mg ibuprofen every 6 hours or 1000 mg of Tylenol every 6 hours.  You may choose to alternate between the 2.  This would be most effective.  Not to exceed 4 g of Tylenol within 24 hours.  Not to exceed 3200 mg ibuprofen 24 hours.  

## 2022-01-24 NOTE — ED Notes (Signed)
Left flank/lower back pain that radiates down to the knee.... Pain dependent on position and movement... No palpable pain... No numbness/tingling.Stephen KitchenMarland Frank

## 2022-01-24 NOTE — ED Provider Notes (Signed)
[facility] Provider Note   CSN: [id number] Arrival date & time: 01/24/22  0710     History  Chief Complaint  Patient presents with   Back Pain    Stephen Frank is a 32 y.o. male with overall noncontributory past medical history who presents with concern for left lower back pain radiating down left leg.  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  Patient reports he has been doing some "stretches", but has not been provided specific rehab exercises, patient has not followed up with orthopedics.  Denies any new injury, fever, chills, numbness, tingling.  Patient denies any previous history of cancer, chronic corticosteroid use, IV drug use.   Back Pain      Home Medications Prior to Admission medications   Medication Sig Start Date End Date Taking? Authorizing Provider  acetaminophen (TYLENOL) 500 MG tablet Take 1 tablet (500 mg total) by mouth every 6 (six) hours as needed. Take 1-2 tablets by mouth every 6 hours 01/24/22  Yes Donley Harland H, PA-C  methocarbamol (ROBAXIN) 500 MG tablet Take 1 tablet (500 mg total) by mouth 2 (two) times daily. 01/24/22  Yes Kalik Hoare H, PA-C  naproxen (NAPROSYN) 375 MG tablet Take 1 tablet (375 mg total) by mouth 2 (two) times daily. 01/24/22  Yes Yandriel Boening H, PA-C  amitriptyline (ELAVIL) 25 MG tablet Take 1 tablet (25 mg total) by mouth at bedtime. 11/01/19   Matilde Haymaker, MD  aspirin-acetaminophen-caffeine (EXCEDRIN MIGRAINE) [phone] MG per tablet Take 2 tablets by mouth every 6 (six) hours as needed for headache.    [provider]  fluticasone (FLONASE) 50 MCG/ACT nasal spray Place 1 spray into both nostrils daily for 3 days. 09/09/21 09/12/21  Teodora Medici, FNP  Fremanezumab-vfrm  (AJOVY) 225 MG/1.5ML SOAJ Inject 225 mg into the skin every 30 (thirty) days. 02/26/21   Melvenia Beam, MD  guaiFENesin 200 MG tablet Take 1 tablet (200 mg total) by mouth every 4 (four) hours as needed for cough or to loosen phlegm. 09/09/21   Teodora Medici, FNP  nirmatrelvir & ritonavir (PAXLOVID, 300/100,) 20 x 150 MG & 10 x '100MG'$  TBPK Take nirmatrelvir (150 mg) two tablets twice daily for 5 days and ritonavir (100 mg) one tablet twice daily for 5 days. 09/10/21   Martyn Malay, MD  rizatriptan (MAXALT-MLT) 10 MG disintegrating tablet Take 1 tablet (10 mg total) by mouth as needed for migraine. May repeat in 2 hours if needed 02/26/21   Melvenia Beam, MD      Allergies    Bee venom and Soy allergy    Review of Systems   Review of Systems  Musculoskeletal:  Positive for back pain.  All other systems reviewed and are negative.   Physical Exam Updated Vital Signs BP 128/83 (BP Location: Right Arm)   Pulse 69   Temp 97.9 F (36.6 C) (Oral)   Resp 18   Ht 6' (1.829 m)   Wt 90.7 kg   SpO2 100%   BMI 27.12 kg/m  Physical Exam Vitals and nursing note reviewed.  Constitutional:      General: He is not in acute distress.    Appearance: Normal appearance.  HENT:     Head: Normocephalic and atraumatic.  Eyes:  General:        Right eye: No discharge.        Left eye: No discharge.  Cardiovascular:     Rate and Rhythm: Normal rate and regular rhythm.  Pulmonary:     Effort: Pulmonary effort is normal. No respiratory distress.  Musculoskeletal:        General: No deformity.     Comments: Patient with intact strength 5/5 bilateral upper and lower extremities.  He has some tenderness in the lumbar paraspinous muscles on the left, with positive straight leg raise on the left.  No midline spinal tenderness throughout the cervical, thoracic, lumbar segments.  He is able to ambulate without difficulty.  Skin:    General: Skin is warm and dry.  Neurological:     Mental Status: He  is alert and oriented to person, place, and time.  Psychiatric:        Mood and Affect: Mood normal.        Behavior: Behavior normal.     ED Results / Procedures / Treatments   Labs (all labs ordered are listed, but only abnormal results are displayed) Labs Reviewed - No data to display  EKG None  Radiology No results found.  Procedures Procedures    Medications Ordered in ED Medications  ibuprofen (ADVIL) tablet 600 mg (has no administration in time range)    ED Course/ Medical Decision Making/ A&P                           Medical Decision Making Risk OTC drugs. Prescription drug management.   Patient with back pain.  My emergent differential diagnosis includes slipped disc, compression fracture, spondylolisthesis, less clinical concern for epidural abscess or osteomyelitis based on patient history.  However given the presentation for this patient, I think that muscle spasm, sciatic nerve inflammation is most likely. No neurological deficits. Patient is ambulatory. No warning symptoms of back pain including: fecal incontinence, urinary retention or overflow incontinence, night sweats, waking from sleep with back pain, unexplained fevers or weight loss, h/o cancer, IVDU, recent trauma. No concern for cauda equina, epidural abscess, or other serious cause of back pain.  Given this work-up, evaluation, physical exam I do not believe that radiographic imaging is indicated at this time.  Conservative measures such as rest, ice/heat, ibuprofen, Tylenol, and  prescription for Robaxin indicated with orthopedic follow-up if no improvement with conservative management.  Extensive return precautions given, patient discharged in stable condition at this time.  Final Clinical Impression(s) / ED Diagnoses Final diagnoses:  Strain of lumbar region, initial encounter  Sciatica of left side    Rx / DC Orders ED Discharge Orders          Ordered    methocarbamol (ROBAXIN) 500 MG  tablet  2 times daily        01/24/22 0949    acetaminophen (TYLENOL) 500 MG tablet  Every 6 hours PRN        01/24/22 0949    naproxen (NAPROSYN) 375 MG tablet  2 times daily        01/24/22 1003              Olamide Lahaie, [place] H, PA-C 01/24/22 1004    Tegeler, Gwenyth Allegra, MD 01/24/22 [phone]

## 2022-02-01 ENCOUNTER — Ambulatory Visit: Payer: Medicaid Other | Admitting: Family Medicine

## 2022-02-01 ENCOUNTER — Encounter: Payer: Self-pay | Admitting: Neurology

## 2022-02-01 ENCOUNTER — Ambulatory Visit: Payer: Medicaid Other | Admitting: Neurology

## 2022-02-01 VITALS — BP 130/75 | HR 67 | Ht 72.0 in | Wt 212.4 lb

## 2022-02-01 DIAGNOSIS — G43709 Chronic migraine without aura, not intractable, without status migrainosus: Secondary | ICD-10-CM

## 2022-02-01 NOTE — Progress Notes (Signed)
[facility]    Provider:  Dr Jaynee Eagles Requesting Provider: Martyn Malay, MD Primary Care Provider:  Martyn Malay, MD  CC:  Migraines  HPI:  Stephen Frank is a 32 y.o. male here as requested by Martyn Malay, MD for chronic headache 4-5 years. Started him on Ajovy. He loved Ajovy, his headaches and migraines completely went away but he did not pick it up from the pharmacy will re-prescribe and check up on him,  MRI brain 02/01/2022: personally reviewed images and agree Narrative & Impression    [facility] [address] [place], [place] [address] [phone]   NEUROIMAGING REPORT     STUDY DATE: 03/11/2021 PATIENT NAME: Stephen Frank DOB: 07-03-1990 MRN: [id number]   EXAM: MRI Brain with and without contrast   ORDERING CLINICIAN: [name], MD CLINICAL HISTORY: 32 year old man with headaches COMPARISON FILMS: None   TECHNIQUE:MRI of the brain with and without contrast was obtained utilizing 5 mm axial slices with T1, T2, T2 flair, SWI and diffusion weighted views.  T1 sagittal, T2 coronal and postcontrast views in the axial and coronal plane were obtained.  CONTRAST: 20 ml Multihance IMAGING SITE: [facility], [place], [employer] [place], [place]    FINDINGS: On sagittal images, the spinal cord is imaged caudally to C2-C3 and is normal in caliber.   The cerebellar tonsils extend 2 mm below the foramen magnum, not enough to be considered a Chiari malformation..  Adjacent brain has normal signal.  The pituitary gland and optic chiasm appear normal.    Brain volume appears normal.   The ventricles are normal in size and without distortion.  There are no abnormal extra-axial collections of fluid.     The cerebellum and brainstem appears normal.   The deep gray matter appears normal.  The cerebral hemispheres appear normal.   Diffusion weighted images are normal.  Susceptibility weighted images are normal.      The  orbits appear normal.   The VIIth/VIIIth nerve complex appears normal.  The mastoid air cells appear normal.   There is minimal mucoperiosteal thickening in the left frontal sinus.  The other paranasal sinuses appear normal.  Flow voids are identified within the major intracerebral arteries.     After the infusion of contrast material, a normal enhancement pattern is noted.     IMPRESSION: This MRI of the brain with and without contrast shows the following: 1.   There were no acute findings.  Normal enhancement pattern. 2.   Brain parenchyma appears normal. 3.   The cerebellar tonsils extend 2 mm below the foramen magnum.  This is not enough to be considered a Chiari malformation and is unlikely to be clinically significant.   Patient complains of symptoms per HPI as well as the following symptoms: migraines . Pertinent negatives and positives per HPI. All others negative    Headaches started 5+ years ago. 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 He does take energy supplements, discussed  the risks including strokes, headaches of taking energy drinks. Tylenol, excedrin may help a little no medication overuse. SOund, light are triggers. Behind the eyes, eye pain. 8 migraine days a month moderate to severe. Worsening. Other days has milder 4/10 headaches. 15 headache days a month total. No other focal neurologic deficits, associated symptoms, inciting events or modifiable factors.   Reviewed notes, labs and imaging from  outside physicians, which showed:   From a thorough review of records, Medications tried that can be used in headache/migraine management include: Tylenol, amitriptyline(side effects), Norvasc (calcium channel blocker similar to verapamil hypotension), Flexeril, Voltaren, Advil, Toradol injections, Mobic tablets, methocarbamol tablets, Reglan injections, naproxen, promethazine injections,imitrex, topamax(tried as a child so likely had migraines had side effects). Aimovig contraindicated due to constipation, propranolol(and other BP meds) contraindicated due to bradycardia when on it and hypotenstion, depakote  XR cervical 04/02/2019: personally reviewed images and agree FINDINGS: There is no evidence of cervical spine fracture or prevertebral soft tissue swelling. Alignment is normal. No other significant bone abnormalities are identified.   IMPRESSION: Negative cervical spine radiographs.     Review of Systems: Patient complains of symptoms per HPI as well as the following symptoms headache. Pertinent negatives and positives per HPI. All others negative.   Social History   Socioeconomic History   Marital status: Single    Spouse name: Not on file   Number of children: Not on file   Years of education: 12 +   Highest education level: Not on file  Occupational History   Occupation: Truck Education administrator: [employer]  Tobacco Use   Smoking status: Former    Packs/day: 1.00    Types: Cigarettes    Start date: 08/25/2014   Smokeless tobacco: Never  Vaping Use   Vaping Use: Never used  Substance and Sexual Activity   Alcohol use: No    Alcohol/week: 0.0 standard drinks of alcohol   Drug use: Yes    Types: Marijuana    Comment: twice  to three times per week.  Last smoked 10-19-2013   Sexual activity: Yes    Birth control/protection: Condom  Other Topics Concern   Not on file  Social History Narrative   Single, truck driver Engineer, agricultural)   R handed   Caffeine:  1 energy drink daily or every other day    Previously incarcerated    Smoker - marijuana   No EtOH, other drugs   Social Determinants of Radio broadcast assistant Strain: Not on file  Food Insecurity: Not on file  Transportation Needs: Not on file  Physical Activity: Not on file  Stress: Not on file  Social Connections: Not on file  Intimate Partner Violence: Not on file    Family History  Problem Relation Age of Onset   Diabetes Mother    Hypertension Mother    Colon cancer Maternal Grandfather    Hyperlipidemia Maternal Grandfather    Diabetes Maternal Grandfather    Pancreatic cancer Maternal Grandfather    Diabetes Maternal Grandmother    Esophageal cancer Neg Hx    Rectal cancer Neg Hx    Stomach cancer Neg Hx    Prostate cancer Neg Hx     Past Medical History:  Diagnosis Date   ADHD (attention deficit hyperactivity disorder)    Allergy    Asthma    Bipolar disorder ([place])    GERD (gastroesophageal reflux disease) 04/25/2012   H/O chlamydia infection    History of seasonal  allergies    Hypertension     Patient Active Problem List   Diagnosis Date Noted   Chronic migraine without aura without status migrainosus, not intractable 02/26/2021   Eosinophilia 07/22/2019   Cannabis use, uncomplicated 84/69/6295   Tobacco abuse 03/13/2017   Seasonal allergic rhinitis 05/08/2014   Bipolar disorder ([place]) 12/14/2011    Past Surgical History:  Procedure Laterality Date   ESOPHAGOGASTRODUODENOSCOPY     TONSILLECTOMY AND ADENOIDECTOMY      Current Outpatient Medications  Medication Sig Dispense Refill   amitriptyline (ELAVIL) 25 MG tablet Take 1 tablet (25 mg total) by mouth at bedtime. 60 tablet 2   aspirin-acetaminophen-caffeine (EXCEDRIN MIGRAINE) 284-132-44 MG per tablet Take 2 tablets by mouth every 6 (six) hours as needed for headache.     Fremanezumab-vfrm (AJOVY) 225 MG/1.5ML SOAJ Inject 225 mg into the skin every 30 (thirty) days. 1.5 mL 11   guaiFENesin  200 MG tablet Take 1 tablet (200 mg total) by mouth every 4 (four) hours as needed for cough or to loosen phlegm. 30 suppository 0   nirmatrelvir & ritonavir (PAXLOVID, 300/100,) 20 x 150 MG & 10 x '100MG'$  TBPK Take nirmatrelvir (150 mg) two tablets twice daily for 5 days and ritonavir (100 mg) one tablet twice daily for 5 days. 30 tablet 0   rizatriptan (MAXALT-MLT) 10 MG disintegrating tablet Take 1 tablet (10 mg total) by mouth as needed for migraine. May repeat in 2 hours if needed 9 tablet 11   fluticasone (FLONASE) 50 MCG/ACT nasal spray Place 1 spray into both nostrils daily for 3 days. 16 g 0   Fremanezumab-vfrm (AJOVY) 225 MG/1.5ML SOAJ Inject 225 mg into the skin every 30 (thirty) days. (Patient not taking: Reported on 02/01/2022) 1.5 mL 11   No current facility-administered medications for this visit.    Allergies as of 02/01/2022 - Review Complete 02/01/2022  Allergen Reaction Noted   Bee venom Hives and Swelling 09/01/2015   Soy allergy  08/26/2013    Vitals: BP 130/75   Pulse 67   Ht 6' (1.829 m)   Wt 212 lb 6.4 oz (96.3 kg)   BMI 28.81 kg/m  Last Weight:  Wt Readings from Last 1 Encounters:  02/01/22 212 lb 6.4 oz (96.3 kg)   Last Height:   Ht Readings from Last 1 Encounters:  02/01/22 6' (1.829 m)    Exam: NAD, pleasant                  Speech:    Speech is normal; fluent and spontaneous with normal comprehension.  Cognition:    The patient is oriented to person, place, and time;     recent and remote memory intact;     language fluent;    Cranial Nerves:    The pupils are equal, round, and reactive to light.Trigeminal sensation is intact and the muscles of mastication are normal. The face is symmetric. The palate elevates in the midline. Hearing intact. Voice is normal. Shoulder shrug is normal. The tongue has normal motion without fasciculations.   Coordination:  No dysmetria  Motor Observation:    No asymmetry, no atrophy, and no involuntary movements  noted. Tone:    Normal muscle tone.     Strength:    Strength is V/V in the upper and lower limbs.      Sensation: intact to LT     Assessment/Plan:  32 year old with chronic migraines. Has tried and failed multiple medications over the years. Worsening.  Needs thorough examination. Started him on Ajovy. He loved Ajovy, his headaches and migraines completely went away but he did not pick it up from the pharmacy will re-prescribe and check up on him, > 8 migraine days a month moderate to severe. Worsening. Other days has milder 4/10 headaches. >15 headache days a month total. Ongoing years.   MRI brain unreamrkable  Patient instructions:  MRI of the brain w/wo contrast: unremarkable Prevention: Ajovy (trained in the office on injection use) gave 3 samples As needed: Rizatriptan for acute/emergent use: Please take one tablet at the onset of your headache. If it does not improve the symptoms please take one additional tablet in 2 hours. Do not take more then 2 tablets in 24hrs. Do not take use more then 2 to 3 times in a week.  From a thorough review of records, Medications tried that can be used in headache/migraine management include: Tylenol, amitriptyline(side effects), Norvasc (calcium channel blocker similar to verapamil hypotension), Flexeril, Voltaren, Advil, Toradol injections, Mobic tablets, methocarbamol tablets, Reglan injections, naproxen, promethazine injections,imitrex, topamax(tried as a child so likely had migraines had side effects). Aimovig contraindicated due to constipation, propranolol(and other BP meds) contraindicated due to bradycardia when on it and hypotenstion, depakote  Meds ordered this encounter  Medications   Fremanezumab-vfrm (AJOVY) 225 MG/1.5ML SOAJ    Sig: Inject 225 mg into the skin every 30 (thirty) days.    Dispense:  1.5 mL    Refill:  11    Cc: Martyn Malay, MD,  Martyn Malay, MD  [name], MD  [facility] [address] [place], [place] [address]  Phone [phone] Fax [phone]  I spent over 30 minutes of face-to-face and non-face-to-face time with patient on the  1. Chronic migraine without aura without status migrainosus, not intractable    diagnosis.  This included previsit chart review, lab review, study review, order entry, electronic health record documentation, patient education on the different diagnostic and therapeutic options, counseling and coordination of care, risks and benefits of management, compliance, or risk factor reduction

## 2022-02-01 NOTE — Patient Instructions (Signed)
Continue Ajovy  Fremanezumab Injection What is this medication? FREMANEZUMAB (fre ma NEZ ue mab) prevents migraines. It works by blocking a substance in the body that causes migraines. It is a monoclonal antibody. This medicine may be used for other purposes; ask your health care provider or pharmacist if you have questions. COMMON BRAND NAME(S): AJOVY What should I tell my care team before I take this medication? They need to know if you have any of these conditions: An unusual or allergic reaction to fremanezumab, other medications, foods, dyes, or preservatives Pregnant or trying to get pregnant Breast-feeding How should I use this medication? This medication is injected under the skin. You will be taught how to prepare and give it. Take it as directed on the prescription label. Keep taking it unless your care team tells you to stop. It is important that you put your used needles and syringes in a special sharps container. Do not put them in a trash can. If you do not have a sharps container, call your pharmacist or care team to get one. Talk to your care team about the use of this medication in children. Special care may be needed. Overdosage: If you think you have taken too much of this medicine contact a poison control center or emergency room at once. NOTE: This medicine is only for you. Do not share this medicine with others. What if I miss a dose? If you miss a dose, take it as soon as you can. If it is almost time for your next dose, take only that dose. Do not take double or extra doses. What may interact with this medication? Interactions are not expected. This list may not describe all possible interactions. Give your health care provider a list of all the medicines, herbs, non-prescription drugs, or dietary supplements you use. Also tell them if you smoke, drink alcohol, or use illegal drugs. Some items may interact with your medicine. What should I watch for while using this  medication? Tell your care team if your symptoms do not start to get better or if they get worse. What side effects may I notice from receiving this medication? Side effects that you should report to your care team as soon as possible: Allergic reactions or angioedema--skin rash, itching or hives, swelling of the face, eyes, lips, tongue, arms, or legs, trouble swallowing or breathing Side effects that usually do not require medical attention (report to your care team if they continue or are bothersome): Pain, redness, or irritation at injection site This list may not describe all possible side effects. Call your doctor for medical advice about side effects. You may report side effects to FDA at 1-800-FDA-1088. Where should I keep my medication? Keep out of the reach of children and pets. Store in a refrigerator or at room temperature between 20 and 25 degrees C (68 and 77 degrees F). Refrigeration (preferred): Store in the refrigerator. Do not freeze. Keep in the original container until you are ready to take it. Remove the dose from the carton about 30 minutes before it is time for you to use it. If the dose is not used, it may be stored in the original container at room temperature for 7 days. Get rid of any unused medication after the expiration date. Room Temperature: This medication may be stored at room temperature for up to 7 days. Keep it in the original container. Protect from light until time of use. If it is stored at room temperature, get rid of  any unused medication after 7 days or after it expires, whichever is first. To get rid of medications that are no longer needed or have expired: Take the medication to a medication take-back program. Check with your pharmacy or law enforcement to find a location. If you cannot return the medication, ask your pharmacist or care team how to get rid of this medication safely. NOTE: This sheet is a summary. It may not cover all possible information.  If you have questions about this medicine, talk to your doctor, pharmacist, or health care provider.  2023 Elsevier/Gold Standard (2016-10-11 00:00:00)

## 2022-02-05 MED ORDER — AJOVY 225 MG/1.5ML ~~LOC~~ SOAJ: 225.0000 mg | SUBCUTANEOUS | 11 refills | Status: DC

## 2022-02-08 ENCOUNTER — Ambulatory Visit: Payer: Medicaid Other | Admitting: Family Medicine

## 2022-02-10 ENCOUNTER — Encounter: Payer: Self-pay | Admitting: *Deleted

## 2022-02-14 NOTE — Telephone Encounter (Signed)
Spoke with patient. He has tried eating better, drinking more water. He has seen a chiropractor before.   Submitted Ajovy PA on CMM. Key: [id number]. Awaiting determination from [place].

## 2022-02-24 ENCOUNTER — Encounter: Payer: Self-pay | Admitting: Student

## 2022-02-24 ENCOUNTER — Ambulatory Visit (INDEPENDENT_AMBULATORY_CARE_PROVIDER_SITE_OTHER): Payer: Medicaid Other | Admitting: Student

## 2022-02-24 ENCOUNTER — Telehealth: Payer: Self-pay | Admitting: *Deleted

## 2022-02-24 ENCOUNTER — Other Ambulatory Visit (HOSPITAL_COMMUNITY)
Admission: RE | Admit: 2022-02-24 | Discharge: 2022-02-24 | Disposition: A | Discharge status: Home / Self Care | Payer: Medicaid Other | Source: Ambulatory Visit | Attending: Family Medicine | Admitting: Family Medicine

## 2022-02-24 VITALS — BP 144/85 | HR 70 | Wt 211.1 lb

## 2022-02-24 DIAGNOSIS — S76202A Unspecified injury of adductor muscle, fascia and tendon of left thigh, initial encounter: Secondary | ICD-10-CM

## 2022-02-24 DIAGNOSIS — Z113 Encounter for screening for infections with a predominantly sexual mode of transmission: Secondary | ICD-10-CM

## 2022-02-24 NOTE — Telephone Encounter (Signed)
-----  Message from Melvenia Beam, MD sent at 02/21/2022  3:07 PM EST ----- Can we ensure his Arie Sabina has been approved and he is getting it please?  ----- Message ----- From: Gildardo Griffes, RN Sent: 02/14/2022  12:04 PM EST To: Melvenia Beam, MD  I am waiting to hear back from [facility]. So far I don't see anything that says he needs to try Emgality. [place] [employer] this year has Ajovy, Teaching laboratory technician, Aimovig and Nurtec on the preferred list but NOT Vyepti or Qulipta (just FYI).  ----- Message ----- From: Melvenia Beam, MD Sent: 02/05/2022   4:37 PM EST To: [facility]  Can you let me know if his Ajovy PA is approved or we have to try emgality please

## 2022-02-24 NOTE — Telephone Encounter (Signed)
Spoke with patient. He has tried eating better, drinking more water. He has seen a chiropractor before.    Submitted Ajovy PA on CMM. Key: [id number]. Awaiting determination from [place].    Determination on CMM PF#[id number] denied.   Re-attempted PA on Ajovy.  The denial did not account for medications that he has tried and previously and determination pending.  KEY [id number].

## 2022-02-24 NOTE — Progress Notes (Signed)
    SUBJECTIVE:   CHIEF COMPLAINT / HPI:   Routine STD screening Sexually active. Asymptomatic. Infrequent condom use. Treated for chlamydia when 32 y.o.   Inner thigh pain, left Present for several weeks.  Pain worse with abduction. Not in testicle or penis "feels like his muscle". Not sure when injury occurred, but remembers getting out of car and abducting leg forcefully-then felt pain in pubic bone on left. Taking advil and tylenol/ muscle relaxer which helps a little bit. Requesting to see sports medicine.   OBJECTIVE:   BP (!) 140/66   Pulse 70   Wt 211 lb 2 oz (95.8 kg)   SpO2 100%   BMI 28.63 kg/m    General: NAD, pleasant Cardio: RRR, no MRG. Cap Refill <2s. Respiratory: CTAB, normal wob on RA MSK: Pain on pubic bone with resisted abduction and resisted abduction, no pain with palpation over adductor muscles.  Patient politely declines GU exam and hernia exam.   ASSESSMENT/PLAN:   Routine STD screening Unfortunately lab is closed, however we will have patient return tomorrow for RPR, HIV blood work as well as GC chlamydia urine cytology.   Injury of adductor muscles on left Based on injury after resisted abduction, and location in pubic region on left suspect this is an adductor muscle sprain.  Patient has tried Tylenol, ibuprofen and muscle relaxers with mild improvement.  On exam patient has reproducible pain with resisted abduction and abduction, pain over pubic bone on left-not reproducible with palpation of abductor muscles.  Patient declined GU exam, however hernia is still in differential.  Differential also includes: Osteitis pubis, stress fracture, apophysitis, nerve entrapment, hip impingement.  Due to mild symptoms that do not affect patient's function, will defer imaging at this time. - Continue conservative measures - Referral to PT for eval and treatment - Follow-up in approximately 4 weeks if not improved (highly recommend GU exam and hernia exam)  Leslie Dales, [place]

## 2022-02-25 ENCOUNTER — Other Ambulatory Visit: Payer: Medicaid Other

## 2022-02-26 LAB — RPR: RPR Ser Ql: NONREACTIVE

## 2022-02-26 LAB — HIV ANTIBODY (ROUTINE TESTING W REFLEX): HIV Screen 4th Generation wRfx: NONREACTIVE

## 2022-02-28 ENCOUNTER — Telehealth: Payer: Self-pay

## 2022-02-28 ENCOUNTER — Other Ambulatory Visit: Payer: Self-pay | Admitting: Family Medicine

## 2022-02-28 LAB — URINE CYTOLOGY ANCILLARY ONLY
Chlamydia: NEGATIVE
Comment: NEGATIVE
Comment: NEGATIVE
Comment: NORMAL
Neisseria Gonorrhea: NEGATIVE
Trichomonas: NEGATIVE

## 2022-02-28 MED ORDER — FEXOFENADINE HCL 180 MG PO TABS: 180.0000 mg | ORAL_TABLET | Freq: Every day | ORAL | 2 refills | Status: DC

## 2022-02-28 NOTE — Telephone Encounter (Signed)
Patient calls nurse line regarding results. He is asking about urine results. Advised that these are still pending and that we will let him know once we have these results.   Patient is also requesting a refill on Allegra. He states that he has been on this in the past, however, is not on current med list.   If appropriate, [facility] [address].   Talbot Grumbling, RN

## 2022-02-28 NOTE — Telephone Encounter (Signed)
Dear red Team I have refilled his allegra Dr. Nelda Bucks saw him for the urine tests and sent him a MyChart message, which looks like patient hasseen. I will reiterate, negative for gonorrhea, chlamydia and trichomnas. Any further questions about that visit should go to Dr. Nelda Bucks. THANKS! Dorcas Mcmurray

## 2022-03-01 NOTE — Telephone Encounter (Signed)
Approved on February 1 Request Reference Number: [id number]. AJOVY INJ 225/1.5 is approved through 05/25/2022. For further questions, call [employer] at [phone]. Authorization Expiration Date: 05/25/2022  Fax confirmation to pharmacy  [phone].

## 2022-03-01 NOTE — Telephone Encounter (Signed)
Called patient and informed him of RX at pharmacy.  He states that someone from the pharmacy called and said that his insurance would not cover the price of the medication. Patient states that he will have to get back in touch with pharmacy.  [name], [name]

## 2022-03-02 ENCOUNTER — Ambulatory Visit (INDEPENDENT_AMBULATORY_CARE_PROVIDER_SITE_OTHER): Payer: Medicaid Other | Admitting: Family Medicine

## 2022-03-02 ENCOUNTER — Ambulatory Visit (HOSPITAL_BASED_OUTPATIENT_CLINIC_OR_DEPARTMENT_OTHER)
Admission: RE | Admit: 2022-03-02 | Discharge: 2022-03-02 | Disposition: A | Discharge status: Home / Self Care | Payer: Medicaid Other | Source: Ambulatory Visit | Attending: Family Medicine | Admitting: Family Medicine

## 2022-03-02 ENCOUNTER — Encounter: Payer: Self-pay | Admitting: Family Medicine

## 2022-03-02 VITALS — BP 126/74 | Ht 72.0 in | Wt 205.0 lb

## 2022-03-02 DIAGNOSIS — S76219A Strain of adductor muscle, fascia and tendon of unspecified thigh, initial encounter: Secondary | ICD-10-CM | POA: Insufficient documentation

## 2022-03-02 DIAGNOSIS — S76212A Strain of adductor muscle, fascia and tendon of left thigh, initial encounter: Secondary | ICD-10-CM

## 2022-03-02 NOTE — Assessment & Plan Note (Signed)
Acutely occurring.  No specific injury.  May have a component of underlying irritation of the labrum. Less likely for a sports hernia.  -Counseled on home exercise therapy and supportive care - counseled on compression  - hip xray  - could consider injection or PT.

## 2022-03-02 NOTE — Progress Notes (Signed)
  Stephen Frank - 32 y.o. male MRN [id number]  Date of birth: 26-Aug-1990  SUBJECTIVE:  Including CC & ROS.  No chief complaint on file.   Stephen Frank is a 32 y.o. male that is presenting with acute groin pain.  The pain has been ongoing for the past few weeks.  He notices the pain with abduction of his left leg.  The pain is localized to the groin.  It is intermittent in nature.  He does take ibuprofen for the pain.    Review of Systems See HPI   HISTORY: Past Medical, Surgical, Social, and Family History Reviewed & Updated per EMR.   Pertinent Historical Findings include:  Past Medical History:  Diagnosis Date   ADHD (attention deficit hyperactivity disorder)    Allergy    Asthma    Bipolar disorder ([place])    GERD (gastroesophageal reflux disease) 04/25/2012   H/O chlamydia infection    History of seasonal allergies    Hypertension     Past Surgical History:  Procedure Laterality Date   ESOPHAGOGASTRODUODENOSCOPY     TONSILLECTOMY AND ADENOIDECTOMY       PHYSICAL EXAM:  VS: BP 126/74   Ht 6' (1.829 m)   Wt 205 lb (93 kg)   BMI 27.80 kg/m  Physical Exam Gen: NAD, alert, cooperative with exam, well-appearing MSK:  Neurovascularly intact       ASSESSMENT & PLAN:   Groin strain Acutely occurring.  No specific injury.  May have a component of underlying irritation of the labrum. Less likely for a sports hernia.  -Counseled on home exercise therapy and supportive care - counseled on compression  - hip xray  - could consider injection or PT.

## 2022-03-02 NOTE — Patient Instructions (Signed)
Good to see you Please try heat  Please try the exercises  You can consider the compression   Please send me a message in MyChart with any questions or updates.  Please see me back in 4-6 weeks.   --Dr. Raeford Razor

## 2022-03-03 NOTE — Telephone Encounter (Signed)
Spoke with patient and advised Ajovy approved. [facility] [address]. I encouraged him to call them to arrange for pickup. He verbalized appreciation.

## 2022-03-07 ENCOUNTER — Telehealth: Payer: Self-pay | Admitting: Family Medicine

## 2022-03-07 NOTE — Telephone Encounter (Signed)
Informed of results.   Rosemarie Ax, MD [facility] 03/07/2022, 4:26 PM

## 2022-03-14 ENCOUNTER — Ambulatory Visit: Payer: Medicaid Other | Attending: Family Medicine

## 2022-03-14 NOTE — Therapy (Incomplete)
OUTPATIENT PHYSICAL THERAPY LOWER EXTREMITY EVALUATION   Patient Name: Stephen Frank MRN: [id number] DOB:January 21, 1991, 32 y.o., male Today's Date: 03/14/2022  END OF SESSION:   Past Medical History:  Diagnosis Date   ADHD (attention deficit hyperactivity disorder)    Allergy    Asthma    Bipolar disorder ([place])    GERD (gastroesophageal reflux disease) 04/25/2012   H/O chlamydia infection    History of seasonal allergies    Hypertension    Past Surgical History:  Procedure Laterality Date   ESOPHAGOGASTRODUODENOSCOPY     TONSILLECTOMY AND ADENOIDECTOMY     Patient Active Problem List   Diagnosis Date Noted   Groin strain 03/02/2022   Chronic migraine without aura without status migrainosus, not intractable 02/26/2021   Eosinophilia 07/22/2019   Cannabis use, uncomplicated [id number]   Tobacco abuse 03/13/2017   Seasonal allergic rhinitis 05/08/2014   Bipolar disorder ([place]) 12/14/2011    PCP: Martyn Malay, MD  REFERRING PROVIDER: Zenia Resides, MD  REFERRING DIAG: S76.202A (ICD-10-CM) - Injury of adductor muscle and tendon of left thigh, initial encounter   THERAPY DIAG:  No diagnosis found.  Rationale for Evaluation and Treatment: Rehabilitation  ONSET DATE: ***  SUBJECTIVE:   SUBJECTIVE STATEMENT: ***  PERTINENT HISTORY: *** PAIN:  Are you having pain? {OPRCPAIN:27236}  PRECAUTIONS: {Therapy precautions:24002}  WEIGHT BEARING RESTRICTIONS: {Yes ***/No:24003}  FALLS:  Has patient fallen in last 6 months? {fallsyesno:27318}  LIVING ENVIRONMENT: Lives with: {[facility] lives with:25569::"lives with their family"} Lives in: {Lives in:25570} Stairs: {opstairs:27293} Has following equipment at home: {Assistive devices:23999}  OCCUPATION: ***  PLOF: {PLOF:24004}  PATIENT GOALS: ***  NEXT MD VISIT: ***  OBJECTIVE:   DIAGNOSTIC FINDINGS: ***  PATIENT SURVEYS:  {rehab surveys:24030}  COGNITION: Overall cognitive status:  {cognition:24006}     SENSATION: {sensation:27233}  EDEMA:  {edema:24020}  MUSCLE LENGTH: Hamstrings: Right *** deg; Left *** deg Thomas test: Right *** deg; Left *** deg  POSTURE: {posture:25561}  PALPATION: ***  LOWER EXTREMITY ROM:  {AROM/PROM:27142} ROM Right eval Left eval  Hip flexion    Hip extension    Hip abduction    Hip adduction    Hip internal rotation    Hip external rotation    Knee flexion    Knee extension    Ankle dorsiflexion    Ankle plantarflexion    Ankle inversion    Ankle eversion     (Blank rows = not tested)  LOWER EXTREMITY MMT:  MMT Right eval Left eval  Hip flexion    Hip extension    Hip abduction    Hip adduction    Hip internal rotation    Hip external rotation    Knee flexion    Knee extension    Ankle dorsiflexion    Ankle plantarflexion    Ankle inversion    Ankle eversion     (Blank rows = not tested)  LOWER EXTREMITY SPECIAL TESTS:  {LEspecialtests:26242}  FUNCTIONAL TESTS:  {Functional tests:24029}  GAIT: Distance walked: *** Assistive device utilized: {Assistive devices:23999} Level of assistance: {Levels of assistance:24026} Comments: ***  TREATMENT: [facility] Adult PT Treatment:                                                DATE: *** Therapeutic Exercise: *** Manual Therapy: *** Neuromuscular re-ed: *** Therapeutic Activity: *** Modalities: *** Self Care: ***  PATIENT EDUCATION:  Education details: *** Person educated: {Person educated:25204} Education method: {Education Method:25205} Education comprehension: {Education Comprehension:25206}  HOME EXERCISE PROGRAM: ***  ASSESSMENT:  CLINICAL IMPRESSION: Patient is a *** y.o. *** who was seen today for physical therapy evaluation and treatment for ***.   OBJECTIVE IMPAIRMENTS: {opptimpairments:25111}.   ACTIVITY LIMITATIONS: {activitylimitations:27494}  PARTICIPATION LIMITATIONS: {participationrestrictions:25113}  PERSONAL  FACTORS: {Personal factors:25162} are also affecting patient's functional outcome.   REHAB POTENTIAL: {rehabpotential:25112}  CLINICAL DECISION MAKING: {clinical decision making:25114}  EVALUATION COMPLEXITY: {Evaluation complexity:25115}   GOALS: Goals reviewed with patient? No  SHORT TERM GOALS: Target date: *** *** Baseline: Goal status: {GOALSTATUS:25110}  2.  *** Baseline:  Goal status: {GOALSTATUS:25110}  LONG TERM GOALS: Target date: ***  *** Baseline:  Goal status: {GOALSTATUS:25110}  2.  *** Baseline:  Goal status: {GOALSTATUS:25110}  3.  *** Baseline:  Goal status: {GOALSTATUS:25110}  4.  *** Baseline:  Goal status: {GOALSTATUS:25110}  5.  *** Baseline:  Goal status: {GOALSTATUS:25110}  6.  *** Baseline:  Goal status: {GOALSTATUS:25110}   PLAN:  PT FREQUENCY: {rehab frequency:25116}  PT DURATION: {rehab duration:25117}  PLANNED INTERVENTIONS: {rehab planned interventions:25118::"Therapeutic exercises","Therapeutic activity","Neuromuscular re-education","Balance training","Gait training","Patient/Family education","Self Care","Joint mobilization"}  PLAN FOR NEXT SESSION: ***   Ward Chatters, PT 03/14/2022, 11:18 AM

## 2022-03-17 ENCOUNTER — Ambulatory Visit (INDEPENDENT_AMBULATORY_CARE_PROVIDER_SITE_OTHER): Payer: Medicaid Other | Admitting: Student

## 2022-03-17 VITALS — BP 120/70 | HR 86 | Ht 72.0 in | Wt 215.4 lb

## 2022-03-17 DIAGNOSIS — G44229 Chronic tension-type headache, not intractable: Secondary | ICD-10-CM

## 2022-03-17 DIAGNOSIS — R030 Elevated blood-pressure reading, without diagnosis of hypertension: Secondary | ICD-10-CM | POA: Insufficient documentation

## 2022-03-17 DIAGNOSIS — I10 Essential (primary) hypertension: Secondary | ICD-10-CM | POA: Insufficient documentation

## 2022-03-17 NOTE — Progress Notes (Signed)
SUBJECTIVE:   CHIEF COMPLAINT / HPI:   Headaches Has a known history of difficult to control migraines for which she follows with neurology.  He is on Ajovy monthly injections with seem to be controlling his migraines quite well.  However, he notes an increase in the number of frontal headaches that he has had over the past 2 weeks or so.  He has always had these on and off in the past and notes that they are quite different from his migraines so he can distinguish pretty easily.  These tend to be bilateral, frontal, not associated with any nausea, vomiting, phono/photophobia.  They respond well to ibuprofen, he is not needing ibuprofen more than once daily.  He denies any new neurologic changes or vision changes associated with these headaches.  He is concerned that they may be related to his blood pressure because he checked his blood pressure at his mom's house and has noticed that he is sometimes high, the highest he has seen was a systolic of the [id number] but he is also had some readings in the 130s and 140s. He notes that he deals with quite a bit of chronic stress and has been told by doctors in the past that he should work on managing this, does not have a an action plan for doing so at this time.  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 Does smoke marijuana, no cigarettes.  Open to therapy. Has a history of pretty bad seasonal allergies but these are reasonably well-controlled with Allegra. No new medications or dose changes over this time period.    OBJECTIVE:   BP 120/70   Pulse 86   Ht 6' (1.829 m)   Wt 215 lb 6.4 oz (97.7 kg)   SpO2 100%   BMI 29.21 kg/m   Physical Exam Vitals reviewed.  Constitutional:      General: He is not in acute distress. HENT:     Mouth/Throat:     Mouth: Mucous membranes are moist.  Eyes:     General: No visual field deficit.    Extraocular Movements:  Extraocular movements intact.     Conjunctiva/sclera: Conjunctivae normal.     Pupils: Pupils are equal, round, and reactive to light.  Musculoskeletal:     Cervical back: Neck supple. No rigidity.  Lymphadenopathy:     Cervical: No cervical adenopathy.  Neurological:     Cranial Nerves: Cranial nerves 2-12 are intact. No cranial nerve deficit or facial asymmetry.     Sensory: Sensation is intact.     Motor: No weakness.     Coordination: Coordination is intact.     Gait: Gait is intact.     Comments: Excellent balance   Psychiatric:        Mood and Affect: Mood is anxious.      ASSESSMENT/PLAN:   Chronic tension-type headache, not intractable Has been seen for in our clinic for the same in the past.  Thought to be related to allergies in the past, I do wonder if there may be a very strong component of his acute on chronic stress playing into this.  We had a long discussion about stress management and he is open to pursuing CBT.  He is getting a reasonable response from ibuprofen for acute abortive therapy and I think this is a reasonable thing to continue as it  does not seem that he is over dosing NSAIDs otherwise.  Overall, I am very reassured by normal neuroexam today. -Ongoing engagement with neurology for control of his migraines -Patient to establish care with a therapist through [email or web address] -Ibuprofen 6103m daily PRN for symptoms -Encourage to stop smoking marijuana, discussed THC edibles as a safer alternative  Elevated blood pressure reading in office without diagnosis of hypertension Initial BP in office was elevated with normal BP on re-check. On chart review, seems this is a common pattern. Given his degree of anxiety, I am not surprised.  Encouraged him to continue checking his blood pressure at his mom's, while I do not think we need to start an antihypertensive now, if he is persistently high when checking out of the office he may be a good candidate for a 24-hour  ambulatory monitor.     JMarnee Guarneri MD [facility] [place]

## 2022-03-17 NOTE — Assessment & Plan Note (Signed)
Has been seen for in our clinic for the same in the past.  Thought to be related to allergies in the past, I do wonder if there may be a very strong component of his acute on chronic stress playing into this.  We had a long discussion about stress management and he is open to pursuing CBT.  He is getting a reasonable response from ibuprofen for acute abortive therapy and I think this is a reasonable thing to continue as it does not seem that he is over dosing NSAIDs otherwise.  Overall, I am very reassured by normal neuroexam today. -Ongoing engagement with neurology for control of his migraines -Patient to establish care with a therapist through [email or web address] -Ibuprofen 624m daily PRN for symptoms -Encourage to stop smoking marijuana, discussed THC edibles as a safer alternative

## 2022-03-17 NOTE — Patient Instructions (Addendum)
I want you to find a therapist who can help with stress management. Go to [email or web address] to search by zip code and insurance.   Ibuprofen is fine when you're having these headaches. I wouldn't want you taking it round the clock every day, but it doesn't sound like that's what you're doing. 671m should be the sweet spot here.   Managing sleep and stress are your friends. There are apps to help with this too.   Switching from smoking weed to edibles could help with blood pressure control.   JMarnee Guarneri MD

## 2022-03-17 NOTE — Assessment & Plan Note (Signed)
Initial BP in office was elevated with normal BP on re-check. On chart review, seems this is a common pattern. Given his degree of anxiety, I am not surprised.  Encouraged him to continue checking his blood pressure at his mom's, while I do not think we need to start an antihypertensive now, if he is persistently high when checking out of the office he may be a good candidate for a 24-hour ambulatory monitor.

## 2022-03-21 ENCOUNTER — Ambulatory Visit: Payer: Medicaid Other | Admitting: Neurology

## 2022-03-22 ENCOUNTER — Other Ambulatory Visit: Payer: Self-pay | Admitting: Neurology

## 2022-03-22 DIAGNOSIS — G43709 Chronic migraine without aura, not intractable, without status migrainosus: Secondary | ICD-10-CM

## 2022-03-31 ENCOUNTER — Ambulatory Visit: Payer: Medicaid Other | Admitting: Family Medicine

## 2022-04-05 ENCOUNTER — Ambulatory Visit: Payer: Medicaid Other

## 2022-05-09 ENCOUNTER — Encounter: Payer: Self-pay | Admitting: *Deleted

## 2022-05-23 ENCOUNTER — Telehealth: Payer: Self-pay | Admitting: Neurology

## 2022-05-23 NOTE — Telephone Encounter (Signed)
Sent mychart msg informing pt of appt change due to provider template change 

## 2022-06-14 ENCOUNTER — Other Ambulatory Visit: Payer: Self-pay

## 2022-06-14 ENCOUNTER — Ambulatory Visit (HOSPITAL_COMMUNITY)
Admission: EM | Admit: 2022-06-14 | Discharge: 2022-06-15 | Disposition: A | Discharge status: Home / Self Care | Payer: Medicaid Other | Attending: Emergency Medicine | Admitting: Emergency Medicine

## 2022-06-14 ENCOUNTER — Encounter (HOSPITAL_COMMUNITY): Payer: Self-pay

## 2022-06-14 DIAGNOSIS — K3580 Unspecified acute appendicitis: Secondary | ICD-10-CM | POA: Diagnosis not present

## 2022-06-14 DIAGNOSIS — K3589 Other acute appendicitis without perforation or gangrene: Secondary | ICD-10-CM | POA: Diagnosis present

## 2022-06-14 DIAGNOSIS — F17210 Nicotine dependence, cigarettes, uncomplicated: Secondary | ICD-10-CM | POA: Diagnosis not present

## 2022-06-14 DIAGNOSIS — Z8249 Family history of ischemic heart disease and other diseases of the circulatory system: Secondary | ICD-10-CM | POA: Diagnosis not present

## 2022-06-14 DIAGNOSIS — I10 Essential (primary) hypertension: Secondary | ICD-10-CM | POA: Diagnosis not present

## 2022-06-14 DIAGNOSIS — F319 Bipolar disorder, unspecified: Secondary | ICD-10-CM | POA: Diagnosis not present

## 2022-06-14 DIAGNOSIS — K3533 Acute appendicitis with perforation and localized peritonitis, with abscess: Secondary | ICD-10-CM | POA: Diagnosis not present

## 2022-06-14 DIAGNOSIS — J45909 Unspecified asthma, uncomplicated: Secondary | ICD-10-CM | POA: Insufficient documentation

## 2022-06-14 NOTE — ED Triage Notes (Signed)
Pt arrives c/o generalized abdominal pain and bloating x3 hours. Denies n/v, constipation or diarrhea. Took peppermint oil and gas-x without relief.

## 2022-06-15 ENCOUNTER — Encounter (HOSPITAL_COMMUNITY): Payer: Self-pay

## 2022-06-15 ENCOUNTER — Emergency Department (EMERGENCY_DEPARTMENT_HOSPITAL): Payer: Medicaid Other | Admitting: Anesthesiology

## 2022-06-15 ENCOUNTER — Emergency Department (HOSPITAL_COMMUNITY): Payer: Medicaid Other

## 2022-06-15 ENCOUNTER — Other Ambulatory Visit: Payer: Self-pay

## 2022-06-15 ENCOUNTER — Emergency Department (HOSPITAL_COMMUNITY): Payer: Medicaid Other | Admitting: Anesthesiology

## 2022-06-15 ENCOUNTER — Encounter (HOSPITAL_COMMUNITY)
Admission: EM | Disposition: A | Discharge status: Home / Self Care | Payer: Self-pay | Source: Home / Self Care | Attending: Emergency Medicine

## 2022-06-15 DIAGNOSIS — I10 Essential (primary) hypertension: Secondary | ICD-10-CM

## 2022-06-15 DIAGNOSIS — F17210 Nicotine dependence, cigarettes, uncomplicated: Secondary | ICD-10-CM

## 2022-06-15 DIAGNOSIS — J45909 Unspecified asthma, uncomplicated: Secondary | ICD-10-CM

## 2022-06-15 DIAGNOSIS — K3580 Unspecified acute appendicitis: Secondary | ICD-10-CM

## 2022-06-15 HISTORY — PX: LAPAROSCOPIC APPENDECTOMY: SHX408

## 2022-06-15 LAB — COMPREHENSIVE METABOLIC PANEL
ALT: 21 U/L (ref 0–44)
AST: 20 U/L (ref 15–41)
Albumin: 4.3 g/dL (ref 3.5–5.0)
Alkaline Phosphatase: 45 U/L (ref 38–126)
Anion gap: 9 (ref 5–15)
BUN: 12 mg/dL (ref 6–20)
CO2: 25 mmol/L (ref 22–32)
Calcium: 9.2 mg/dL (ref 8.9–10.3)
Chloride: 103 mmol/L (ref 98–111)
Creatinine, Ser: 0.96 mg/dL (ref 0.61–1.24)
GFR, Estimated: >60 mL/min (ref >60)
Glucose, Bld: 91 mg/dL (ref 70–99)
Potassium: 3.3 mmol/L — ABNORMAL LOW (ref 3.5–5.1)
Sodium: 137 mmol/L (ref 135–145)
Total Bilirubin: 0.7 mg/dL (ref 0.3–1.2)
Total Protein: 7.7 g/dL (ref 6.5–8.1)

## 2022-06-15 LAB — CBC WITH DIFFERENTIAL/PLATELET
Abs Immature Granulocytes: 0.02 10*3/uL (ref 0.00–0.07)
Basophils Absolute: 0.1 10*3/uL (ref 0.0–0.1)
Basophils Relative: 1 %
Eosinophils Absolute: 0.5 10*3/uL (ref 0.0–0.5)
Eosinophils Relative: 4 %
HCT: 44.6 % (ref 39.0–52.0)
Hemoglobin: 14.7 g/dL (ref 13.0–17.0)
Immature Granulocytes: 0 %
Lymphocytes Relative: 34 %
Lymphs Abs: 3.5 10*3/uL (ref 0.7–4.0)
MCH: 29.5 pg (ref 26.0–34.0)
MCHC: 33 g/dL (ref 30.0–36.0)
MCV: 89.4 fL (ref 80.0–100.0)
Monocytes Absolute: 0.8 10*3/uL (ref 0.1–1.0)
Monocytes Relative: 7 %
Neutro Abs: 5.5 10*3/uL (ref 1.7–7.7)
Neutrophils Relative %: 54 %
Platelets: 173 10*3/uL (ref 150–400)
RBC: 4.99 MIL/uL (ref 4.22–5.81)
RDW: 13 % (ref 11.5–15.5)
WBC: 10.3 10*3/uL (ref 4.0–10.5)
nRBC: 0 % (ref 0.0–0.2)

## 2022-06-15 LAB — URINALYSIS, ROUTINE W REFLEX MICROSCOPIC
Bilirubin Urine: NEGATIVE
Glucose, UA: NEGATIVE mg/dL
Hgb urine dipstick: NEGATIVE
Ketones, ur: NEGATIVE mg/dL
Leukocytes,Ua: NEGATIVE
Nitrite: NEGATIVE
Protein, ur: NEGATIVE mg/dL
Specific Gravity, Urine: 1.03 (ref 1.005–1.030)
pH: 5 (ref 5.0–8.0)

## 2022-06-15 LAB — LIPASE, BLOOD: Lipase: 36 U/L (ref 11–51)

## 2022-06-15 SURGERY — APPENDECTOMY, LAPAROSCOPIC
Anesthesia: General

## 2022-06-15 MED ORDER — ORAL CARE MOUTH RINSE: 15.0000 mL | Freq: Once | OROMUCOSAL | Status: DC

## 2022-06-15 MED ORDER — PANTOPRAZOLE SODIUM 20 MG PO TBEC
20.0000 mg | DELAYED_RELEASE_TABLET | Freq: Every day | ORAL | Status: DC
  Administered 2022-06-15: 20 mg via ORAL
  Filled 2022-06-15: qty 1

## 2022-06-15 MED ORDER — PROMETHAZINE HCL 25 MG/ML IJ SOLN: 6.2500 mg | INTRAMUSCULAR | Status: DC | PRN

## 2022-06-15 MED ORDER — DICYCLOMINE HCL 10 MG PO CAPS
10.0000 mg | ORAL_CAPSULE | Freq: Once | ORAL | Status: AC
  Administered 2022-06-15: 10 mg via ORAL
  Filled 2022-06-15: qty 1

## 2022-06-15 MED ORDER — FENTANYL CITRATE (PF) 250 MCG/5ML IJ SOLN
INTRAMUSCULAR | Status: AC
  Filled 2022-06-15: qty 5

## 2022-06-15 MED ORDER — LACTATED RINGERS IV SOLN: INTRAVENOUS | Status: DC

## 2022-06-15 MED ORDER — BUPIVACAINE HCL (PF) 0.5 % IJ SOLN
INTRAMUSCULAR | Status: AC
  Filled 2022-06-15: qty 30

## 2022-06-15 MED ORDER — ONDANSETRON HCL 4 MG/2ML IJ SOLN
INTRAMUSCULAR | Status: DC | PRN
  Administered 2022-06-15: 4 mg via INTRAVENOUS

## 2022-06-15 MED ORDER — OXYCODONE HCL 5 MG/5ML PO SOLN: 5.0000 mg | Freq: Once | ORAL | Status: AC | PRN

## 2022-06-15 MED ORDER — FENTANYL CITRATE PF 50 MCG/ML IJ SOSY
25.0000 ug | PREFILLED_SYRINGE | INTRAMUSCULAR | Status: DC | PRN
  Administered 2022-06-15 (×2): 50 ug via INTRAVENOUS

## 2022-06-15 MED ORDER — MIDAZOLAM HCL 5 MG/5ML IJ SOLN
INTRAMUSCULAR | Status: DC | PRN
  Administered 2022-06-15: 2 mg via INTRAVENOUS

## 2022-06-15 MED ORDER — FENTANYL CITRATE PF 50 MCG/ML IJ SOSY
PREFILLED_SYRINGE | INTRAMUSCULAR | Status: AC
  Administered 2022-06-15: 50 ug via INTRAVENOUS
  Filled 2022-06-15: qty 2

## 2022-06-15 MED ORDER — LACTATED RINGERS IV SOLN: INTRAVENOUS | Status: DC | PRN

## 2022-06-15 MED ORDER — FENTANYL CITRATE (PF) 100 MCG/2ML IJ SOLN
INTRAMUSCULAR | Status: DC | PRN
  Administered 2022-06-15: 50 ug via INTRAVENOUS
  Administered 2022-06-15: 100 ug via INTRAVENOUS

## 2022-06-15 MED ORDER — OXYCODONE HCL 5 MG PO TABS: 5.0000 mg | ORAL_TABLET | Freq: Four times a day (QID) | ORAL | 0 refills | Status: DC | PRN

## 2022-06-15 MED ORDER — OXYCODONE HCL 5 MG PO TABS: 5.0000 mg | ORAL_TABLET | Freq: Once | ORAL | Status: AC | PRN

## 2022-06-15 MED ORDER — CHLORHEXIDINE GLUCONATE 0.12 % MT SOLN: 15.0000 mL | Freq: Once | OROMUCOSAL | Status: DC

## 2022-06-15 MED ORDER — ROCURONIUM BROMIDE 100 MG/10ML IV SOLN
INTRAVENOUS | Status: DC | PRN
  Administered 2022-06-15: 50 mg via INTRAVENOUS

## 2022-06-15 MED ORDER — MIDAZOLAM HCL 2 MG/2ML IJ SOLN
INTRAMUSCULAR | Status: AC
  Filled 2022-06-15: qty 2

## 2022-06-15 MED ORDER — KETOROLAC TROMETHAMINE 30 MG/ML IJ SOLN
INTRAMUSCULAR | Status: AC
  Filled 2022-06-15: qty 1

## 2022-06-15 MED ORDER — LIDOCAINE HCL (CARDIAC) PF 100 MG/5ML IV SOSY
PREFILLED_SYRINGE | INTRAVENOUS | Status: DC | PRN
  Administered 2022-06-15: 100 mg via INTRAVENOUS

## 2022-06-15 MED ORDER — PROPOFOL 10 MG/ML IV BOLUS
INTRAVENOUS | Status: AC
  Filled 2022-06-15: qty 20

## 2022-06-15 MED ORDER — KETOROLAC TROMETHAMINE 15 MG/ML IJ SOLN
INTRAMUSCULAR | Status: DC | PRN
  Administered 2022-06-15: 30 mg via INTRAVENOUS

## 2022-06-15 MED ORDER — IOHEXOL 300 MG/ML  SOLN
100.0000 mL | Freq: Once | INTRAMUSCULAR | Status: AC | PRN
  Administered 2022-06-15: 100 mL via INTRAVENOUS

## 2022-06-15 MED ORDER — DEXAMETHASONE SODIUM PHOSPHATE 4 MG/ML IJ SOLN
INTRAMUSCULAR | Status: DC | PRN
  Administered 2022-06-15: 8 mg via INTRAVENOUS

## 2022-06-15 MED ORDER — SODIUM CHLORIDE (PF) 0.9 % IJ SOLN
INTRAMUSCULAR | Status: AC
  Filled 2022-06-15: qty 50

## 2022-06-15 MED ORDER — 0.9 % SODIUM CHLORIDE (POUR BTL) OPTIME
TOPICAL | Status: DC | PRN
  Administered 2022-06-15: 1000 mL

## 2022-06-15 MED ORDER — FENTANYL CITRATE PF 50 MCG/ML IJ SOSY
PREFILLED_SYRINGE | INTRAMUSCULAR | Status: AC
  Filled 2022-06-15: qty 1

## 2022-06-15 MED ORDER — SODIUM CHLORIDE 0.9 % IV BOLUS
1000.0000 mL | Freq: Once | INTRAVENOUS | Status: AC
  Administered 2022-06-15: 1000 mL via INTRAVENOUS

## 2022-06-15 MED ORDER — SODIUM CHLORIDE 0.9 % IV SOLN
2.0000 g | Freq: Once | INTRAVENOUS | Status: AC
  Administered 2022-06-15: 2 g via INTRAVENOUS
  Filled 2022-06-15: qty 20

## 2022-06-15 MED ORDER — SUGAMMADEX SODIUM 200 MG/2ML IV SOLN
INTRAVENOUS | Status: DC | PRN
  Administered 2022-06-15: 400 mg via INTRAVENOUS

## 2022-06-15 MED ORDER — PROPOFOL 10 MG/ML IV BOLUS
INTRAVENOUS | Status: DC | PRN
  Administered 2022-06-15: 200 mg via INTRAVENOUS
  Administered 2022-06-15: 50 mg via INTRAVENOUS

## 2022-06-15 MED ORDER — METRONIDAZOLE 500 MG/100ML IV SOLN
500.0000 mg | Freq: Once | INTRAVENOUS | Status: AC
  Administered 2022-06-15: 500 mg via INTRAVENOUS
  Filled 2022-06-15: qty 100

## 2022-06-15 MED ORDER — ACETAMINOPHEN 10 MG/ML IV SOLN
INTRAVENOUS | Status: DC | PRN
  Administered 2022-06-15: 1000 mg via INTRAVENOUS

## 2022-06-15 MED ORDER — BUPIVACAINE HCL (PF) 0.5 % IJ SOLN
INTRAMUSCULAR | Status: DC | PRN
  Administered 2022-06-15: 20 mL

## 2022-06-15 MED ORDER — LACTATED RINGERS IR SOLN
Status: DC | PRN
  Administered 2022-06-15: 1000 mL

## 2022-06-15 MED ORDER — ACETAMINOPHEN 10 MG/ML IV SOLN
INTRAVENOUS | Status: AC
  Filled 2022-06-15: qty 100

## 2022-06-15 MED ORDER — OXYCODONE HCL 5 MG PO TABS
ORAL_TABLET | ORAL | Status: AC
  Administered 2022-06-15: 5 mg via ORAL
  Filled 2022-06-15: qty 1

## 2022-06-15 MED ORDER — HYDROMORPHONE HCL 1 MG/ML IJ SOLN
0.5000 mg | Freq: Once | INTRAMUSCULAR | Status: AC
  Administered 2022-06-15: 0.5 mg via INTRAVENOUS
  Filled 2022-06-15: qty 1

## 2022-06-15 MED ORDER — DEXMEDETOMIDINE HCL IN NACL 80 MCG/20ML IV SOLN
INTRAVENOUS | Status: DC | PRN
  Administered 2022-06-15: 8 ug via INTRAVENOUS

## 2022-06-15 SURGICAL SUPPLY — 39 items
ADH SKN CLS APL DERMABOND .7 (GAUZE/BANDAGES/DRESSINGS) ×1
APL PRP STRL LF DISP 70% ISPRP (MISCELLANEOUS) ×1
APPLIER CLIP 5 13 M/L LIGAMAX5 (MISCELLANEOUS)
APR CLP MED LRG 5 ANG JAW (MISCELLANEOUS)
BAG COUNTER SPONGE SURGICOUNT (BAG) IMPLANT
BAG SPNG CNTER NS LX DISP (BAG)
CABLE HIGH FREQUENCY MONO STRZ (ELECTRODE) ×1 IMPLANT
CHLORAPREP W/TINT 26 (MISCELLANEOUS) ×1 IMPLANT
CLIP APPLIE 5 13 M/L LIGAMAX5 (MISCELLANEOUS) IMPLANT
COVER SURGICAL LIGHT HANDLE (MISCELLANEOUS) ×1 IMPLANT
CUTTER FLEX LINEAR 45M (STAPLE) IMPLANT
DERMABOND ADVANCED .7 DNX12 (GAUZE/BANDAGES/DRESSINGS) ×1 IMPLANT
DRAPE LAPAROSCOPIC ABDOMINAL (DRAPES) ×1 IMPLANT
ELECT REM PT RETURN 15FT ADLT (MISCELLANEOUS) ×1 IMPLANT
GLOVE BIO SURGEON STRL SZ7.5 (GLOVE) ×1 IMPLANT
GOWN STRL REUS W/ TWL XL LVL3 (GOWN DISPOSABLE) ×2 IMPLANT
GOWN STRL REUS W/TWL XL LVL3 (GOWN DISPOSABLE) ×2
IRRIG SUCT STRYKERFLOW 2 WTIP (MISCELLANEOUS) ×1
IRRIGATION SUCT STRKRFLW 2 WTP (MISCELLANEOUS) ×1 IMPLANT
KIT BASIN OR (CUSTOM PROCEDURE TRAY) ×1 IMPLANT
KIT TURNOVER KIT A (KITS) IMPLANT
PENCIL SMOKE EVACUATOR (MISCELLANEOUS) IMPLANT
RELOAD 45 VASCULAR/THIN (ENDOMECHANICALS) IMPLANT
RELOAD STAPLE 45 2.5 WHT GRN (ENDOMECHANICALS) IMPLANT
RELOAD STAPLE 45 3.5 BLU ETS (ENDOMECHANICALS) IMPLANT
RELOAD STAPLE TA45 3.5 REG BLU (ENDOMECHANICALS) ×1 IMPLANT
SET TUBE SMOKE EVAC HIGH FLOW (TUBING) ×1 IMPLANT
SHEARS HARMONIC ACE PLUS 36CM (ENDOMECHANICALS) IMPLANT
SLEEVE Z-THREAD 5X100MM (TROCAR) ×1 IMPLANT
SPIKE FLUID TRANSFER (MISCELLANEOUS) ×1 IMPLANT
SUT MNCRL AB 4-0 PS2 18 (SUTURE) ×1 IMPLANT
SYS BAG RETRIEVAL 10MM (BASKET) ×1
SYSTEM BAG RETRIEVAL 10MM (BASKET) ×1 IMPLANT
TOWEL OR 17X26 10 PK STRL BLUE (TOWEL DISPOSABLE) ×1 IMPLANT
TOWEL OR NON WOVEN STRL DISP B (DISPOSABLE) ×1 IMPLANT
TRAY FOLEY MTR SLVR 16FR STAT (SET/KITS/TRAYS/PACK) ×1 IMPLANT
TRAY LAPAROSCOPIC (CUSTOM PROCEDURE TRAY) ×1 IMPLANT
TROCAR BALLN 12MMX100 BLUNT (TROCAR) ×1 IMPLANT
TROCAR Z-THREAD OPTICAL 5X100M (TROCAR) ×1 IMPLANT

## 2022-06-15 NOTE — Anesthesia Procedure Notes (Signed)
Procedure Name: Intubation Date/Time: 06/15/2022 10:22 AM  Performed by: Beryle Lathe, MDPre-anesthesia Checklist: Patient identified, Emergency Drugs available, Suction available and Patient being monitored Patient Re-evaluated:Patient Re-evaluated prior to induction Oxygen Delivery Method: Circle system utilized Preoxygenation: Pre-oxygenation with 100% oxygen Induction Type: IV induction Ventilation: Mask ventilation without difficulty Laryngoscope Size: Glidescope and 4 Grade View: Grade I Tube type: Oral Tube size: 7.5 mm Number of attempts: 3 Airway Equipment and Method: Video-laryngoscopy and Rigid stylet Placement Confirmation: ETT inserted through vocal cords under direct vision, positive ETCO2 and breath sounds checked- equal and bilateral Secured at: 24 cm Tube secured with: Tape Dental Injury: Teeth and Oropharynx as per pre-operative assessment  Comments: Initial DL by CRNA with Mac 3, grade 4 view. Second look by Dr. Mal Amabile with Mil 3, grade 3 view. Third look with Glidescope 4, grade 1 view. No desaturation during intubation attempts.

## 2022-06-15 NOTE — Anesthesia Postprocedure Evaluation (Signed)
Anesthesia Post Note  Patient: Stephen Frank  Procedure(s) Performed: APPENDECTOMY LAPAROSCOPIC     Patient location during evaluation: PACU Anesthesia Type: General Level of consciousness: awake and alert Pain management: pain level controlled Vital Signs Assessment: post-procedure vital signs reviewed and stable Respiratory status: spontaneous breathing, nonlabored ventilation and respiratory function stable Cardiovascular status: stable and blood pressure returned to baseline Anesthetic complications: no   No notable events documented.  Last Vitals:  Vitals:   06/15/22 1203 06/15/22 1215  BP:  127/68  Pulse: 69 68  Resp: 10 13  Temp:  36.6 C  SpO2: 96% 100%    Last Pain:  Vitals:   06/15/22 1215  TempSrc:   PainSc: 5                  Beryle Lathe

## 2022-06-15 NOTE — H&P (Signed)
Stephen Frank is an 32 y.o. male.   Chief Complaint: abdominal pain HPI: This is a 32 year old gentleman who presented with abdominal pain that started last evening.  He reports it started central around his umbilicus and he developed bloating.  The pain is now moved to the right lower quadrant.  He had no nausea or vomiting.  He has had no similar history of abdominal pain.  He is otherwise healthy and without complaints.  He presented to the emergency department where he underwent a CT scan which showed findings consistent with acute appendicitis.  He has no cardiopulmonary issues and is otherwise healthy.  Denies fevers or chills.  Past Medical History:  Diagnosis Date   ADHD (attention deficit hyperactivity disorder)    Allergy    Asthma    Bipolar disorder (HCC)    GERD (gastroesophageal reflux disease) 04/25/2012   H/O chlamydia infection    History of seasonal allergies    Hypertension     Past Surgical History:  Procedure Laterality Date   ESOPHAGOGASTRODUODENOSCOPY     TONSILLECTOMY AND ADENOIDECTOMY      Family History  Problem Relation Age of Onset   Diabetes Mother    Hypertension Mother    Colon cancer Maternal Grandfather    Hyperlipidemia Maternal Grandfather    Diabetes Maternal Grandfather    Pancreatic cancer Maternal Grandfather    Diabetes Maternal Grandmother    Esophageal cancer Neg Hx    Rectal cancer Neg Hx    Stomach cancer Neg Hx    Prostate cancer Neg Hx    Social History:  reports that he has quit smoking. His smoking use included cigarettes. He started smoking about 7 years ago. He smoked an average of 1 pack per day. He has never used smokeless tobacco. He reports current drug use. Drug: Marijuana. He reports that he does not drink alcohol.  Allergies:  Allergies  Allergen Reactions   Bee Venom Hives and Swelling   Soy Allergy     Per the pt he eats soy products without sx.  He said he tested positive to soy on an allergy test.    (Not in a  hospital admission)   Results for orders placed or performed during the hospital encounter of 06/14/22 (from the past 48 hour(s))  CBC with Differential     Status: None   Collection Time: 06/15/22 12:18 AM  Result Value Ref Range   WBC 10.3 4.0 - 10.5 K/uL   RBC 4.99 4.22 - 5.81 MIL/uL   Hemoglobin 14.7 13.0 - 17.0 g/dL   HCT 09.8 11.9 - 14.7 %   MCV 89.4 80.0 - 100.0 fL   MCH 29.5 26.0 - 34.0 pg   MCHC 33.0 30.0 - 36.0 g/dL   RDW 82.9 56.2 - 13.0 %   Platelets 173 150 - 400 K/uL   nRBC 0.0 0.0 - 0.2 %   Neutrophils Relative % 54 %   Neutro Abs 5.5 1.7 - 7.7 K/uL   Lymphocytes Relative 34 %   Lymphs Abs 3.5 0.7 - 4.0 K/uL   Monocytes Relative 7 %   Monocytes Absolute 0.8 0.1 - 1.0 K/uL   Eosinophils Relative 4 %   Eosinophils Absolute 0.5 0.0 - 0.5 K/uL   Basophils Relative 1 %   Basophils Absolute 0.1 0.0 - 0.1 K/uL   Immature Granulocytes 0 %   Abs Immature Granulocytes 0.02 0.00 - 0.07 K/uL    Comment: Performed at [facility], [address] [place], [place] [address]  Urinalysis, Routine w reflex microscopic -Urine, Clean Catch     Status: None   Collection Time: 06/15/22  1:05 AM  Result Value Ref Range   Color, Urine YELLOW YELLOW   APPearance CLEAR CLEAR   Specific Gravity, Urine 1.030 1.005 - 1.030   pH 5.0 5.0 - 8.0   Glucose, UA NEGATIVE NEGATIVE mg/dL   Hgb urine dipstick NEGATIVE NEGATIVE   Bilirubin Urine NEGATIVE NEGATIVE   Ketones, ur NEGATIVE NEGATIVE mg/dL   Protein, ur NEGATIVE NEGATIVE mg/dL   Nitrite NEGATIVE NEGATIVE   Leukocytes,Ua NEGATIVE NEGATIVE    Comment: Performed at [facility], [address] [place], [place] [address]  Comprehensive metabolic panel     Status: Abnormal   Collection Time: 06/15/22  1:13 AM  Result Value Ref Range   Sodium 137 135 - 145 mmol/L   Potassium 3.3 (L) 3.5 - 5.1 mmol/L   Chloride 103 98 - 111 mmol/L   CO2 25 22 - 32 mmol/L   Glucose, Bld 91 70 - 99 mg/dL     Comment: Glucose reference range applies only to samples taken after fasting for at least 8 hours.   BUN 12 6 - 20 mg/dL   Creatinine, Ser 8.29 0.61 - 1.24 mg/dL   Calcium 9.2 8.9 - 56.2 mg/dL   Total Protein 7.7 6.5 - 8.1 g/dL   Albumin 4.3 3.5 - 5.0 g/dL   AST 20 15 - 41 U/L   ALT 21 0 - 44 U/L   Alkaline Phosphatase 45 38 - 126 U/L   Total Bilirubin 0.7 0.3 - 1.2 mg/dL   GFR, Estimated >13 >08 mL/min    Comment: (NOTE) Calculated using the CKD-EPI Creatinine Equation (2021)    Anion gap 9 5 - 15    Comment: Performed at [facility], [address], [place], [place] [address]  Lipase, blood     Status: None   Collection Time: 06/15/22  1:13 AM  Result Value Ref Range   Lipase 36 11 - 51 U/L    Comment: Performed at [facility], [address], [place], [place] [address]   CT ABDOMEN PELVIS W CONTRAST  Result Date: 06/15/2022 CLINICAL DATA:  Abdominal pain. EXAM: CT ABDOMEN AND PELVIS WITH CONTRAST TECHNIQUE: Multidetector CT imaging of the abdomen and pelvis was performed using the standard protocol following bolus administration of intravenous contrast. RADIATION DOSE REDUCTION: This exam was performed according to the departmental dose-optimization program which includes automated exposure control, adjustment of the mA and/or kV according to patient size and/or use of iterative reconstruction technique. CONTRAST:  OMNIPAQUE IOHEXOL 300 MG/ML  SOLN COMPARISON:  Abdominal CT dated 11/08/2018. FINDINGS: Lower chest: The visualized lung bases are clear. No intra-abdominal free air or free fluid. Hepatobiliary: No focal liver abnormality is seen. No gallstones, gallbladder wall thickening, or biliary dilatation. Pancreas: Unremarkable. No pancreatic ductal dilatation or surrounding inflammatory changes. Spleen: Normal in size without focal abnormality. Adrenals/Urinary Tract: The adrenal glands are unremarkable. The kidneys, visualized ureters, and  urinary bladder appear unremarkable. Stomach/Bowel: There is no bowel obstruction. Thickened appearance of the colon likely related to underdistention. The appendix is enlarged and inflamed measuring approximately 12 mm in diameter. The appendix is located medial to the cecum in the right lower quadrant and extends inferiorly into the right hemipelvis. No abscess or perforation. Vascular/Lymphatic: The abdominal aorta and IVC are unremarkable. No portal venous gas. There is no adenopathy. Reproductive: The  prostate and seminal vesicles are grossly unremarkable. No pelvic mass. Other: None Musculoskeletal: No acute or significant osseous findings. IMPRESSION: Acute appendicitis. No abscess or perforation. Electronically Signed   By: Elgie Collard M.D.   On: 06/15/2022 02:56    Review of Systems  All other systems reviewed and are negative.   Blood pressure 118/68, pulse 62, temperature 97.9 F (36.6 C), resp. rate 16, height 6' (1.829 m), weight 93 kg, SpO2 100 %. Physical Exam Constitutional:      General: He is not in acute distress.    Appearance: He is well-developed. He is not ill-appearing.  HENT:     Head: Normocephalic and atraumatic.  Cardiovascular:     Rate and Rhythm: Normal rate and regular rhythm.  Pulmonary:     Effort: Pulmonary effort is normal. No respiratory distress.  Abdominal:     Comments: Mildly distended with tenderness in the right lower quadrant and mild to moderate guarding  Skin:    General: Skin is warm and dry.     Findings: No erythema.  Neurological:     General: No focal deficit present.     Mental Status: He is alert and oriented to person, place, and time.  Psychiatric:        Mood and Affect: Mood normal.        Behavior: Behavior normal.      Assessment/Plan Acute appendicitis  I reviewed the patient's CT scan and labs and discussed diagnosis with him.  I laparoscopic appendectomy is recommended.  I discussed the reasons for this with him.  I  explained the surgical procedure in detail.  We discussed the risks which includes but is not limited to bleeding, infection, injury to surrounding structures, the need to convert to an open procedure, appendiceal stump leak, cardiopulmonary issues postoperatively, and postoperative recovery.  He understands and agrees to proceed with surgery which is scheduled for this morning  Moderate medical decision making  Abigail Miyamoto, MD 06/15/2022, 7:15 AM

## 2022-06-15 NOTE — ED Provider Notes (Signed)
[facility] AT [facility] Provider Note   CSN: [id number] Arrival date & time: 06/14/22  2334     History  Chief Complaint  Patient presents with   Abdominal Pain    Stephen Frank is a 32 y.o. male with medical history of GERD, hypertension, asthma.  Patient presents to ED for evaluation of abdominal bloating.  Patient reports that today he mowed his lawn and then was watching a basketball game.  Patient states that around 7 when he was watching this basketball game he had sudden onset of abdominal bloating.  The patient denies any abdominal pain, nausea, vomiting, diarrhea, constipation, fevers.  Patient reports that he attempted to have bowel movement as he thought this would help alleviate the pain.  The patient reports he had a bowel movement but it did not alleviate his pain.  He denies any urinary symptoms.  He states he has had this pain before in the past and the pain usually always goes away.  The patient reports that he took peppermint oil, Gas-X however it did not alleviate abdominal bloating.  He denies a history of abdominal surgeries.  He states that he is still passing gas.   Abdominal Pain Associated symptoms: no fever, no nausea and no vomiting        Home Medications Prior to Admission medications   Medication Sig Start Date End Date Taking? Authorizing Provider  aspirin-acetaminophen-caffeine (EXCEDRIN MIGRAINE) [phone] MG per tablet Take 2 tablets by mouth every 6 (six) hours as needed for headache.   Yes [provider]  simethicone (MYLICON) 125 MG chewable tablet Chew 125 mg by mouth every 6 (six) hours as needed for flatulence.   Yes [provider]  amitriptyline (ELAVIL) 25 MG tablet Take 1 tablet (25 mg total) by mouth at bedtime. Patient not taking: Reported on 06/15/2022 11/01/19   Mirian Mo, MD  fexofenadine (ALLEGRA) 180 MG tablet Take 1 tablet (180 mg total) by mouth daily. Patient not taking:  Reported on 06/15/2022 02/28/22   Nestor Ramp, MD  fluticasone [facility]) 50 MCG/ACT nasal spray Place 1 spray into both nostrils daily for 3 days. Patient not taking: Reported on 06/15/2022 09/09/21 09/12/21  Gustavus Bryant, FNP  Fremanezumab-vfrm (AJOVY) 225 MG/1.5ML SOAJ Inject 225 mg into the skin every 30 (thirty) days. Patient not taking: Reported on 02/01/2022 02/26/21   Anson Fret, MD  Fremanezumab-vfrm (AJOVY) 225 MG/1.5ML SOAJ INJECT 225 MG INTO THE SKIN EVERY 30 (THIRTY) DAYS. Patient not taking: Reported on 06/15/2022 03/24/22   Anson Fret, MD  guaiFENesin 200 MG tablet Take 1 tablet (200 mg total) by mouth every 4 (four) hours as needed for cough or to loosen phlegm. Patient not taking: Reported on 06/15/2022 09/09/21   Gustavus Bryant, FNP  nirmatrelvir & ritonavir (PAXLOVID, 300/100,) 20 x 150 MG & 10 x 100MG  TBPK Take nirmatrelvir (150 mg) two tablets twice daily for 5 days and ritonavir (100 mg) one tablet twice daily for 5 days. Patient not taking: Reported on 06/15/2022 09/10/21   Westley Chandler, MD  rizatriptan (MAXALT-MLT) 10 MG disintegrating tablet Take 1 tablet (10 mg total) by mouth as needed for migraine. May repeat in 2 hours if needed Patient not taking: Reported on 06/15/2022 02/26/21   Anson Fret, MD      Allergies    Bee venom and Soy allergy    Review of Systems   Review of Systems  Constitutional:  Negative for fever.  Gastrointestinal:  Positive for abdominal distention and abdominal pain. Negative for nausea and vomiting.  All other systems reviewed and are negative.   Physical Exam Updated Vital Signs BP (!) 142/95   Pulse 78   Temp 97.9 F (36.6 C)   Resp 20   Ht 6' (1.829 m)   Wt 93 kg   SpO2 100%   BMI 27.80 kg/m  Physical Exam Vitals and nursing note reviewed.  Constitutional:      General: He is not in acute distress.    Appearance: Normal appearance. He is not ill-appearing, toxic-appearing or diaphoretic.  HENT:     Head:  Normocephalic and atraumatic.     Nose: Nose normal.     Mouth/Throat:     Mouth: Mucous membranes are moist.     Pharynx: Oropharynx is clear.  Eyes:     Extraocular Movements: Extraocular movements intact.     Conjunctiva/sclera: Conjunctivae normal.     Pupils: Pupils are equal, round, and reactive to light.  Cardiovascular:     Rate and Rhythm: Normal rate.  Pulmonary:     Effort: Pulmonary effort is normal.     Breath sounds: Normal breath sounds. No wheezing.  Abdominal:     General: Abdomen is flat.     Tenderness: There is no abdominal tenderness.     Comments: No abdominal tenderness noted. No RLQ tenderness, no RUQ tenderness. No rebound or guarding. Abdomen soft and compressible  Skin:    General: Skin is warm and dry.  Neurological:     Mental Status: He is alert and oriented to person, place, and time.     ED Results / Procedures / Treatments   Labs (all labs ordered are listed, but only abnormal results are displayed) Labs Reviewed  COMPREHENSIVE METABOLIC PANEL - Abnormal; Notable for the following components:      Result Value   Potassium 3.3 (*)    All other components within normal limits  CBC WITH DIFFERENTIAL/PLATELET  URINALYSIS, ROUTINE W REFLEX MICROSCOPIC  LIPASE, BLOOD    EKG None  Radiology CT ABDOMEN PELVIS W CONTRAST  Result Date: 06/15/2022 CLINICAL DATA:  Abdominal pain. EXAM: CT ABDOMEN AND PELVIS WITH CONTRAST TECHNIQUE: Multidetector CT imaging of the abdomen and pelvis was performed using the standard protocol following bolus administration of intravenous contrast. RADIATION DOSE REDUCTION: This exam was performed according to the departmental dose-optimization program which includes automated exposure control, adjustment of the mA and/or kV according to patient size and/or use of iterative reconstruction technique. CONTRAST:  OMNIPAQUE IOHEXOL 300 MG/ML  SOLN COMPARISON:  Abdominal CT dated 11/08/2018. FINDINGS: Lower chest: The  visualized lung bases are clear. No intra-abdominal free air or free fluid. Hepatobiliary: No focal liver abnormality is seen. No gallstones, gallbladder wall thickening, or biliary dilatation. Pancreas: Unremarkable. No pancreatic ductal dilatation or surrounding inflammatory changes. Spleen: Normal in size without focal abnormality. Adrenals/Urinary Tract: The adrenal glands are unremarkable. The kidneys, visualized ureters, and urinary bladder appear unremarkable. Stomach/Bowel: There is no bowel obstruction. Thickened appearance of the colon likely related to underdistention. The appendix is enlarged and inflamed measuring approximately 12 mm in diameter. The appendix is located medial to the cecum in the right lower quadrant and extends inferiorly into the right hemipelvis. No abscess or perforation. Vascular/Lymphatic: The abdominal aorta and IVC are unremarkable. No portal venous gas. There is no adenopathy. Reproductive: The prostate and seminal vesicles are grossly unremarkable. No pelvic mass. Other: None Musculoskeletal: No acute or significant osseous findings.  IMPRESSION: Acute appendicitis. No abscess or perforation. Electronically Signed   By: Elgie Collard M.D.   On: 06/15/2022 02:56    Procedures Procedures   Medications Ordered in ED Medications  pantoprazole (PROTONIX) EC tablet 20 mg (20 mg Oral Given 06/15/22 0040)  dicyclomine (BENTYL) capsule 10 mg (10 mg Oral Given 06/15/22 0202)  iohexol (OMNIPAQUE) 300 MG/ML solution 100 mL (100 mLs Intravenous Contrast Given 06/15/22 0222)  HYDROmorphone (DILAUDID) injection 0.5 mg (0.5 mg Intravenous Given 06/15/22 0309)  sodium chloride 0.9 % bolus 1,000 mL (0 mLs Intravenous Stopped 06/15/22 0428)  cefTRIAXone (ROCEPHIN) 2 g in sodium chloride 0.9 % 100 mL IVPB (0 g Intravenous Stopped 06/15/22 0355)    And  metroNIDAZOLE (FLAGYL) IVPB 500 mg (0 mg Intravenous Stopped 06/15/22 0429)    ED Course/ Medical Decision Making/ A&P  Medical  Decision Making Amount and/or Complexity of Data Reviewed Labs: ordered. Radiology: ordered.  Risk Prescription drug management.   33 year old male presents to the ED for evaluation abdominal discomfort.  Please see HPI for further details.  On examination patient is afebrile, he is not tachycardic.  His lung sounds are clear bilaterally and he is not hypoxic.  His abdomen is soft and compressible without tenderness elicited.  Patient reports he just feels "bloated" but denies any abdominal pain.  He denies any nausea or vomiting.  He states he had a bowel movement prior to coming to the ED.  We worked patient up utilizing CBC, CMP, lipase and urinalysis.  Will provide patient with Bentyl for abdominal discomfort.  CBC without leukocytosis or anemia.  CMP with potassium 3.3 however no other electrolyte derangement, no elevated creatinine, no elevated LFT, no anion gap elevation.  Urinalysis unremarkable. Lipase WNL.  On reassessment patient reports that his abdominal discomfort is worsening.  He states that his abdominal pressure is growing.  Will CT scan the patient's abdomen and his pelvis to assess for underlying cause. Abdominal re-examination is still benign.  CT scan shows acute uncomplicated appendicitis.  Patient has had no nausea or vomiting, no fever, no white count elevation.  Will make patient n.p.o., will page on-call general surgeon.  Dr. Corliss Skains, general surgery, has returned my call and has stated that he will operate on the patient this morning at 7 AM.  The patient will stay in the ED until he is taken to the OR.  He has been made NPO.  The patient will be given 500 mg Flagyl, 2 g ceftriaxone, 1 L fluid.  Will control the patient pain with 0.5 mg Dilaudid.  Patient is stable at this time.  Final Clinical Impression(s) / ED Diagnoses Final diagnoses:  Other acute appendicitis    Rx / DC Orders ED Discharge Orders     None         Clent Ridges 06/15/22 0620    Paula Libra, MD 06/15/22 [phone]

## 2022-06-15 NOTE — Op Note (Signed)
Appendectomy, Lap, Procedure Note  Indications: The patient presented with a history of right-sided abdominal pain. A CT revealed findings consistent with acute appendicitis.  Pre-operative Diagnosis: acute appendicitis  Post-operative Diagnosis: same  Surgeon: Abigail Miyamoto   Assistants: none  Anesthesia: General endotracheal anesthesia  ASA Class: 1  Procedure Details  The patient was seen again in the Holding Room. The risks, benefits, complications, treatment options, and expected outcomes were discussed with the patient and/or family. The possibilities of reaction to medication, perforation of viscus, bleeding, recurrent infection, finding a normal appendix, the need for additional procedures, failure to diagnose a condition, and creating a complication requiring transfusion or operation were discussed. There was concurrence with the proposed plan and informed consent was obtained. The site of surgery was properly noted. The patient was taken to Operating Room, identified as Stephen Frank and the procedure verified as Appendectomy. A Time Out was held and the above information confirmed.  The patient was placed in the supine position and general anesthesia was induced, along with placement of orogastric tube, Venodyne boots, and a Foley catheter. The abdomen was prepped and draped in a sterile fashion. A one centimeter infraumbilical incision was made.  The umbilical  midline fascia was incised with a #15 blade.  A Kelly clamp was used to confirm entrance into the peritoneal cavity.  A pursestring suture was passed around the incision with a 0 Vicryl.  The Hasson was introduced into the abdomen and the tails of the suture were used to hold the Hasson in place.   The pneumoperitoneum was then established to steady pressure of 15 mmHg.  Additional 5 mm cannulas then placed in the left lower quadrant of the abdomen and the right upper quadrant under direct visualization. A careful  evaluation of the entire abdomen was carried out. The patient was placed in Trendelenburg and left lateral decubitus position. The small intestines were retracted in the cephalad and left lateral direction away from the pelvis and right lower quadrant. The patient was found to have an enlarged and inflamed appendix that was extending into the pelvis. There was no evidence of perforation.  The appendix was carefully dissected. The appendix was was skeletonized with the harmonic scalpel.   The appendix was divided at its base using an endo-GIA stapler. Minimal appendiceal stump was left in place. There was no evidence of bleeding, leakage, or complication after division of the appendix. Irrigation was also performed and irrigate suctioned from the abdomen as well.  The umbilical port site was closed with the purse string suture. There was no residual palpable fascial defect.  The trocar site skin wounds were closed with 4-0 Monocryl.  Instrument, sponge, and needle counts were correct at the conclusion of the case.   Findings: The appendix was found to be inflamed. There were not signs of necrosis.  There was not perforation. There was not abscess formation.  Estimated Blood Loss:  Minimal                 Complications:  None; patient tolerated the procedure well.         Disposition: PACU - hemodynamically stable.         Condition: stable

## 2022-06-15 NOTE — Anesthesia Procedure Notes (Deleted)
Procedure Name: Intubation Date/Time: 06/15/2022 10:41 AM  Performed by: Deri Fuelling, CRNAPre-anesthesia Checklist: Patient identified, Emergency Drugs available, Suction available and Patient being monitored Patient Re-evaluated:Patient Re-evaluated prior to induction Oxygen Delivery Method: Circle system utilized Preoxygenation: Pre-oxygenation with 100% oxygen Induction Type: IV induction Ventilation: Mask ventilation without difficulty Laryngoscope Size: Glidescope Tube type: Oral Number of attempts: 1 Airway Equipment and Method: Stylet and Oral airway Placement Confirmation: ETT inserted through vocal cords under direct vision, positive ETCO2 and breath sounds checked- equal and bilateral Tube secured with: Tape Dental Injury: Teeth and Oropharynx as per pre-operative assessment

## 2022-06-15 NOTE — Anesthesia Preprocedure Evaluation (Addendum)
Anesthesia Evaluation  Patient identified by MRN, date of birth, ID band Patient awake    Reviewed: Allergy & Precautions, NPO status , Patient's Chart, lab work & pertinent test results  History of Anesthesia Complications Negative for: history of anesthetic complications  Airway Mallampati: I  TM Distance: >3 FB Neck ROM: Full    Dental  (+) Dental Advisory Given, Teeth Intact   Pulmonary asthma , Current Smoker and Patient abstained from smoking.   Pulmonary exam normal        Cardiovascular hypertension (no meds currently), Normal cardiovascular exam     Neuro/Psych  Headaches PSYCHIATRIC DISORDERS   Bipolar Disorder      GI/Hepatic ,GERD  Controlled and Medicated,,(+)     substance abuse  marijuana use  Endo/Other  negative endocrine ROS    Renal/GU negative Renal ROS     Musculoskeletal negative musculoskeletal ROS (+)    Abdominal   Peds  (+) ADHD Hematology negative hematology ROS (+)   Anesthesia Other Findings   Reproductive/Obstetrics                             Anesthesia Physical Anesthesia Plan  ASA: 2  Anesthesia Plan: General   Post-op Pain Management: Ofirmev IV (intra-op)* and Toradol IV (intra-op)*   Induction: Intravenous, Rapid sequence and Cricoid pressure planned  PONV Risk Score and Plan: 2 and Treatment may vary due to age or medical condition, Ondansetron, Dexamethasone and Midazolam  Airway Management Planned: Oral ETT  Additional Equipment: None  Intra-op Plan:   Post-operative Plan: Extubation in OR  Informed Consent: I have reviewed the patients History and Physical, chart, labs and discussed the procedure including the risks, benefits and alternatives for the proposed anesthesia with the patient or authorized representative who has indicated his/her understanding and acceptance.     Dental advisory given  Plan Discussed with: CRNA and  Anesthesiologist  Anesthesia Plan Comments:         Anesthesia Quick Evaluation

## 2022-06-15 NOTE — Transfer of Care (Signed)
Immediate Anesthesia Transfer of Care Note  Patient: Stephen Frank  Procedure(s) Performed: APPENDECTOMY LAPAROSCOPIC  Patient Location: PACU  Anesthesia Type:General  Level of Consciousness: awake and alert   Airway & Oxygen Therapy: Patient Spontanous Breathing and Patient connected to face mask oxygen  Post-op Assessment: Report given to RN and Post -op Vital signs reviewed and stable  Post vital signs: Reviewed and stable  Last Vitals:  Vitals Value Taken Time  BP 147/77 06/15/22 1117  Temp    Pulse 84 06/15/22 1120  Resp 14 06/15/22 1120  SpO2 94 % 06/15/22 1120  Vitals shown include unvalidated device data.  Last Pain:  Vitals:   06/15/22 0930  TempSrc: Oral  PainSc: 7          Complications: No notable events documented.

## 2022-06-15 NOTE — Discharge Instructions (Signed)
[facility] ______[facility] LAPAROSCOPIC SURGERY: POST OP INSTRUCTIONS Always review your discharge instruction sheet given to you by the facility where your surgery was performed. IF YOU HAVE DISABILITY OR FAMILY LEAVE FORMS, YOU MUST BRING THEM TO THE OFFICE FOR PROCESSING.   DO NOT GIVE THEM TO YOUR DOCTOR.  A prescription for pain medication may be given to you upon discharge.  Take your pain medication as prescribed, if needed.  If narcotic pain medicine is not needed, then you may take acetaminophen (Tylenol) or ibuprofen (Advil) as needed. Take your usually prescribed medications unless otherwise directed. If you need a refill on your pain medication, please contact your pharmacy.  They will contact our office to request authorization. Prescriptions will not be filled after 5pm or on week-ends. You should follow a light diet the first few days after arrival home, such as soup and crackers, etc.  Be sure to include lots of fluids daily. Most patients will experience some swelling and bruising in the area of the incisions.  Ice packs will help.  Swelling and bruising can take several days to resolve.  It is common to experience some constipation if taking pain medication after surgery.  Increasing fluid intake and taking a stool softener (such as Colace) will usually help or prevent this problem from occurring.  A mild laxative (Milk of Magnesia or Miralax) should be taken according to package instructions if there are no bowel movements after 48 hours. Unless discharge instructions indicate otherwise, you may remove your bandages 24-48 hours after surgery, and you may shower at that time.  You may have steri-strips (small skin tapes) in place directly over the incision.  These strips should be left on the skin for 7-10 days.  If your surgeon used skin glue on the incision, you may shower in 24 hours.  The glue will flake off over the next 2-3 weeks.  Any sutures or staples will be  removed at the office during your follow-up visit. ACTIVITIES:  You may resume regular (light) daily activities beginning the next day--such as daily self-care, walking, climbing stairs--gradually increasing activities as tolerated.  You may have sexual intercourse when it is comfortable.  Refrain from any heavy lifting or straining until approved by your doctor. You may drive when you are no longer taking prescription pain medication, you can comfortably wear a seatbelt, and you can safely maneuver your car and apply brakes. RETURN TO WORK:  _IN ONE TO TWO WEEKS.  CALL OUR OFFICE FOR ANY NOTES YOU MAY NEED_______________________________________________________ Stephen Frank should see your doctor in the office for a follow-up appointment approximately 2-3 weeks after your surgery.  Make sure that you call for this appointment within a day or two after you arrive home to insure a convenient appointment time. OTHER INSTRUCTIONS: YOU MAY SHOWER STARTING TOMORROW ICE PACK, TYLENOL, AND IBUPROFEN ALSO FOR PAIN NO LIFTING MORE THAN 15 POUNDS FOR 2 WEEKS__________________________________________________________________________________________________________________________ __________________________________________________________________________________________________________________________ WHEN TO CALL YOUR DOCTOR: Fever over 101.0 Inability to urinate Continued bleeding from incision. Increased pain, redness, or drainage from the incision. Increasing abdominal pain  The clinic staff is available to answer your questions during regular business hours.  Please don't hesitate to call and ask to speak to one of the nurses for clinical concerns.  If you have a medical emergency, go to the nearest emergency room or call 911.  A surgeon from [facility] is always on call at the hospital. [address], [address], [place], [place]  [address] ?  [id number], [name], [place]   [address] [phone] ?  [phone] ? FAX [phone] Web site: [email or web address]

## 2022-06-16 ENCOUNTER — Telehealth: Payer: Self-pay

## 2022-06-16 ENCOUNTER — Encounter (HOSPITAL_COMMUNITY): Payer: Self-pay | Admitting: Surgery

## 2022-06-16 LAB — SURGICAL PATHOLOGY

## 2022-06-16 NOTE — Transitions of Care (Post Inpatient/ED Visit) (Signed)
   06/16/2022  Name: Stephen Frank MRN: [id number] DOB: 08/18/1990  Today's TOC FU Call Status: Today's TOC FU Call Status:: Successful TOC FU Call Competed TOC FU Call Complete Date: 06/16/22  Transition Care Management Follow-up Telephone Call Date of Discharge: 06/15/22 Discharge Facility: [facility]) Type of Discharge: Inpatient Admission Primary Inpatient Discharge Diagnosis:: appendicitis How have you been since you were released from the hospital?: Better Any questions or concerns?: No  Items Reviewed: Medications obtained,verified, and reconciled?: Yes (Medications Reviewed) Any new allergies since your discharge?: No Dietary orders reviewed?: Yes  Medications Reviewed Today: Medications Reviewed Today     Reviewed by Karena Addison, LPN (Licensed Practical Nurse) on 06/16/22 at [phone]  Med List Status: <None>   Medication Order Taking? Sig Documenting Provider Last Dose Status Informant  aspirin-acetaminophen-caffeine (EXCEDRIN MIGRAINE) 250-250-65 MG per tablet [id number] Yes Take 2 tablets by mouth every 6 (six) hours as needed for headache. [provider] Taking Active Multiple Informants, Self, Pharmacy Records  fexofenadine (ALLEGRA) 180 MG tablet [id number] Yes Take 1 tablet (180 mg total) by mouth daily. Nestor Ramp, MD Taking Active Self, Pharmacy Records  fluticasone [facility]) 50 MCG/ACT nasal spray [id number]  Place 1 spray into both nostrils daily for 3 days.  Patient not taking: Reported on 06/15/2022   Gustavus Bryant, FNP  Expired 09/12/21 2359   Fremanezumab-vfrm (AJOVY) 225 MG/1.5ML SOAJ [id number] Yes Inject 225 mg into the skin every 30 (thirty) days. Anson Fret, MD Taking Active Self, Pharmacy Records  Fremanezumab-vfrm (AJOVY) 225 MG/1.5ML Ivory Broad [id number] Yes INJECT 225 MG INTO THE SKIN EVERY 30 (THIRTY) DAYS. Anson Fret, MD Taking Active Self, Pharmacy Records  guaiFENesin 200 MG tablet [id number] Yes Take 1 tablet (200 mg total)  by mouth every 4 (four) hours as needed for cough or to loosen phlegm. Gustavus Bryant, [place] Taking Active Self, Pharmacy Records  oxyCODONE (OXY IR/ROXICODONE) 5 MG immediate release tablet [id number] Yes Take 1 tablet (5 mg total) by mouth every 6 (six) hours as needed for moderate pain, severe pain or breakthrough pain. Abigail Miyamoto, MD Taking Active   simethicone [facility]) 125 MG chewable tablet [id number] Yes Chew 125 mg by mouth every 6 (six) hours as needed for flatulence. [provider] Taking Active Self, Pharmacy Records            Home Care and Equipment/Supplies: Were Home Health Services Ordered?: NA Any new equipment or medical supplies ordered?: NA  Functional Questionnaire: Do you need assistance with bathing/showering or dressing?: No Do you need assistance with meal preparation?: No Do you need assistance with eating?: No Do you have difficulty maintaining continence: No Do you need assistance with getting out of bed/getting out of a chair/moving?: No Do you have difficulty managing or taking your medications?: No  Follow up appointments reviewed: PCP Follow-up appointment confirmed?: No (no avail appt times, sent message to staff to schedule) Specialist Hospital Follow-up appointment confirmed?: No Reason Specialist Follow-Up Not Confirmed: Patient has Specialist Provider Number and will Call for Appointment Do you need transportation to your follow-up appointment?: No Do you understand care options if your condition(s) worsen?: Yes-patient verbalized understanding    SIGNATURE Karena Addison, LPN [facility] Nurse Health Advisor Direct Dial [phone]

## 2022-06-22 ENCOUNTER — Inpatient Hospital Stay: Payer: Medicaid Other | Admitting: Family Medicine

## 2022-06-23 ENCOUNTER — Encounter: Payer: Self-pay | Admitting: Family Medicine

## 2022-06-23 ENCOUNTER — Ambulatory Visit (INDEPENDENT_AMBULATORY_CARE_PROVIDER_SITE_OTHER): Payer: Medicaid Other | Admitting: Family Medicine

## 2022-06-23 VITALS — BP 130/87 | HR 84 | Ht 72.0 in | Wt 211.6 lb

## 2022-06-23 DIAGNOSIS — R109 Unspecified abdominal pain: Secondary | ICD-10-CM | POA: Diagnosis present

## 2022-06-23 DIAGNOSIS — Z9049 Acquired absence of other specified parts of digestive tract: Secondary | ICD-10-CM | POA: Diagnosis not present

## 2022-06-23 NOTE — Progress Notes (Signed)
    SUBJECTIVE:   CHIEF COMPLAINT / HPI:   Abdominal Pain Follow-Up Patient presented to the ED on 06/15/22 with abdominal pain and bloating.  Workup revealed uncomplicated appendicitis.  He underwent laparoscopic appendectomy on 5/22 and was discharged home that same day.   Reports he is doing quite well since discharge.  Initially had abdominal pain postoperatively, particularly with coughing/sneezing, but he reports this is significantly better.  Not needing any pain medication.  He is having normal bowel movements, eating/drinking normally denies nausea/vomiting or any issues with urination.  States his surgeon did want to see him the office. Currently does not have an appointment scheduled.  PERTINENT  PMH / PSH: migraines, cannabis use, bipolar disorder (not on meds for several years)  OBJECTIVE:   BP 130/87   Pulse 84   Ht 6' (1.829 m)   Wt 211 lb 9.6 oz (96 kg)   SpO2 98%   BMI 28.70 kg/m   Gen: NAD, pleasant, able to participate in exam CV: RRR, normal S1/S2, no murmur Resp: Normal effort, lungs CTAB GI: well-healing surgical sites on abdomen without dehiscence, drainage, or surrounding erythema. Abdomen soft, nontender, nondistended. Extremities: no edema or cyanosis Skin: warm and dry, no rashes noted Neuro: alert, no obvious focal deficits Psych: Normal affect and mood   ASSESSMENT/PLAN:   Abdominal Pain Resolved. He is s/p laparoscopic appendectomy on 06/15/2022.  Surgical sites healing appropriately. He was provided contact information for surgeon's office and advised to schedule routine follow-up with them.   Return in 3 months for annual physical with PCP.   Maury Dus, MD [facility]

## 2022-06-23 NOTE — Patient Instructions (Addendum)
It was great to see you!  Please schedule a follow-up visit with your surgeon. Dr [facility] [address] [place], [address] [place] [phone]  Return in 3 months for an annual physical.   Take care, Dr Anner Crete

## 2022-08-08 ENCOUNTER — Telehealth: Payer: Medicaid Other | Admitting: Neurology

## 2022-08-10 ENCOUNTER — Telehealth: Payer: Medicaid Other | Admitting: Neurology

## 2022-08-10 NOTE — Progress Notes (Deleted)
[id number]    Provider:  Dr Lucia Gaskins Requesting Provider: Westley Chandler, MD Primary Care Provider:  Westley Chandler, MD  CC:  Migraines  Approved for emgality on 02/24/2022  HPI:  Stephen Frank is a 32 y.o. male here as requested by Westley Chandler, MD for chronic headache 4-5 years. Started him on Ajovy. He loved Ajovy, his headaches and migraines completely went away but he did not pick it up from the pharmacy will re-prescribe and check up on him,  MRI brain 02/01/2022: personally reviewed images and agree Narrative & Impression    [facility] [address] [place], [place] [address] [phone]   NEUROIMAGING REPORT     STUDY DATE: 03/11/2021 PATIENT NAME: Stephen Frank DOB: 1990/10/09 MRN: [id number]   EXAM: MRI Brain with and without contrast   ORDERING CLINICIAN: Naomie Dean, MD CLINICAL HISTORY: 32 year old man with headaches COMPARISON FILMS: None   TECHNIQUE:MRI of the brain with and without contrast was obtained utilizing 5 mm axial slices with T1, T2, T2 flair, SWI and diffusion weighted views.  T1 sagittal, T2 coronal and postcontrast views in the axial and coronal plane were obtained.  CONTRAST: 20 ml Multihance IMAGING SITE: [facility], [address] [place], [place], [place]    FINDINGS: On sagittal images, the spinal cord is imaged caudally to C2-C3 and is normal in caliber.   The cerebellar tonsils extend 2 mm below the foramen magnum, not enough to be considered a Chiari malformation..  Adjacent brain has normal signal.  The pituitary gland and optic chiasm appear normal.    Brain volume appears normal.   The ventricles are normal in size and without distortion.  There are no abnormal extra-axial collections of fluid.     The cerebellum and brainstem appears normal.   The deep gray matter appears normal.  The cerebral hemispheres appear normal.   Diffusion weighted images are normal.  Susceptibility  weighted images are normal.      The orbits appear normal.   The VIIth/VIIIth nerve complex appears normal.  The mastoid air cells appear normal.   There is minimal mucoperiosteal thickening in the left frontal sinus.  The other paranasal sinuses appear normal.  Flow voids are identified within the major intracerebral arteries.     After the infusion of contrast material, a normal enhancement pattern is noted.     IMPRESSION: This MRI of the brain with and without contrast shows the following: 1.   There were no acute findings.  Normal enhancement pattern. 2.   Brain parenchyma appears normal. 3.   The cerebellar tonsils extend 2 mm below the foramen magnum.  This is not enough to be considered a Chiari malformation and is unlikely to be clinically significant.   Patient complains of symptoms per HPI as well as the following symptoms: migraines . Pertinent negatives and positives per HPI. All others negative    Headaches started 5+ years ago. 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  He does take energy supplements, discussed the risks including strokes, headaches of taking energy drinks. Tylenol, excedrin may help a little no medication overuse. SOund, light are triggers. Behind the eyes, eye pain. 8 migraine days a month moderate to severe. Worsening. Other days has milder 4/10 headaches. 15 headache days a month total. No other focal neurologic deficits, associated symptoms, inciting events or modifiable  factors.   Reviewed notes, labs and imaging from outside physicians, which showed:   From a thorough review of records, Medications tried that can be used in headache/migraine management include: Tylenol, amitriptyline(side effects), Norvasc (calcium channel blocker similar to verapamil hypotension), Flexeril, Voltaren, Advil, Toradol injections, Mobic tablets, methocarbamol tablets, Reglan injections, naproxen, promethazine injections,imitrex, topamax(tried as a child so likely had migraines had side effects). Aimovig contraindicated due to constipation, propranolol(and other BP meds) contraindicated due to bradycardia when on it and hypotenstion, depakote  XR cervical 04/02/2019: personally reviewed images and agree FINDINGS: There is no evidence of cervical spine fracture or prevertebral soft tissue swelling. Alignment is normal. No other significant bone abnormalities are identified.   IMPRESSION: Negative cervical spine radiographs.     Review of Systems: Patient complains of symptoms per HPI as well as the following symptoms headache. Pertinent negatives and positives per HPI. All others negative.   Social History   Socioeconomic History   Marital status: Single    Spouse name: Not on file   Number of children: Not on file   Years of education: 12 +   Highest education level: Not on file  Occupational History   Occupation: Truck Air traffic controller: [employer]  Tobacco Use   Smoking status: Every Day    Current packs/day: 0.25    Average packs/day: 0.3 packs/day for 8.0 years (2.0 ttl pk-yrs)    Types: Cigarettes    Start date: 08/25/2014   Smokeless tobacco: Never  Vaping Use   Vaping status: Never Used  Substance and Sexual Activity   Alcohol use: No    Alcohol/week: 0.0 standard drinks of alcohol   Drug use: Yes    Types: Marijuana    Comment: twice  to daily times per week.   Sexual activity: Yes    Birth control/protection: Condom  Other Topics Concern   Not  on file  Social History Narrative   Single, truck driver Film/video editor)   R handed   Caffeine: 1 energy drink daily or every other day    Previously incarcerated    Smoker - marijuana   No EtOH, other drugs   Social Determinants of Corporate investment banker Strain: Not on file  Food Insecurity: Not on file  Transportation Needs: Not on file  Physical Activity: Not on file  Stress: Not on file  Social Connections: Not on file  Intimate Partner Violence: Not on file    Family History  Problem Relation Age of Onset   Diabetes Mother    Hypertension Mother    Colon cancer Maternal Grandfather    Hyperlipidemia Maternal Grandfather    Diabetes Maternal Grandfather    Pancreatic cancer Maternal Grandfather    Diabetes Maternal Grandmother    Esophageal cancer Neg Hx    Rectal cancer Neg Hx    Stomach cancer Neg Hx    Prostate cancer Neg Hx     Past Medical History:  Diagnosis Date   ADHD (attention deficit hyperactivity disorder)    Allergy    Asthma    during childhood, improved as adult  Bipolar disorder (HCC)    GERD (gastroesophageal reflux disease) 04/25/2012   H/O chlamydia infection    History of seasonal allergies    Hypertension     Patient Active Problem List   Diagnosis Date Noted   Elevated blood pressure reading in office without diagnosis of hypertension 03/17/2022   Groin strain 03/02/2022   Chronic migraine without aura without status migrainosus, not intractable 02/26/2021   Chronic tension-type headache, not intractable 11/01/2019   Eosinophilia 07/22/2019   Cannabis use, uncomplicated 02/23/2018   Tobacco abuse 03/13/2017   Seasonal allergic rhinitis 05/08/2014   Bipolar disorder (HCC) 12/14/2011    Past Surgical History:  Procedure Laterality Date   ESOPHAGOGASTRODUODENOSCOPY     LAPAROSCOPIC APPENDECTOMY N/A 06/15/2022   Procedure: APPENDECTOMY LAPAROSCOPIC;  Surgeon: Abigail Miyamoto, MD;  Location: [facility];  Service:  General;  Laterality: N/A;   TONSILLECTOMY AND ADENOIDECTOMY      Current Outpatient Medications  Medication Sig Dispense Refill   aspirin-acetaminophen-caffeine (EXCEDRIN MIGRAINE) 250-250-65 MG per tablet Take 2 tablets by mouth every 6 (six) hours as needed for headache.     fexofenadine (ALLEGRA) 180 MG tablet Take 1 tablet (180 mg total) by mouth daily. 30 tablet 2   fluticasone (FLONASE) 50 MCG/ACT nasal spray Place 1 spray into both nostrils daily for 3 days. (Patient not taking: Reported on 06/15/2022) 16 g 0   Fremanezumab-vfrm (AJOVY) 225 MG/1.5ML SOAJ Inject 225 mg into the skin every 30 (thirty) days. 1.5 mL 11   Fremanezumab-vfrm (AJOVY) 225 MG/1.5ML SOAJ INJECT 225 MG INTO THE SKIN EVERY 30 (THIRTY) DAYS. 1.5 mL 11   guaiFENesin 200 MG tablet Take 1 tablet (200 mg total) by mouth every 4 (four) hours as needed for cough or to loosen phlegm. 30 suppository 0   oxyCODONE (OXY IR/ROXICODONE) 5 MG immediate release tablet Take 1 tablet (5 mg total) by mouth every 6 (six) hours as needed for moderate pain, severe pain or breakthrough pain. 25 tablet 0   simethicone (MYLICON) 125 MG chewable tablet Chew 125 mg by mouth every 6 (six) hours as needed for flatulence.     No current facility-administered medications for this visit.    Allergies as of 08/10/2022 - Review Complete 06/16/2022  Allergen Reaction Noted   Bee venom Hives and Swelling 09/01/2015   Soy allergy  08/26/2013    Vitals: There were no vitals taken for this visit. Last Weight:  Wt Readings from Last 1 Encounters:  06/23/22 211 lb 9.6 oz (96 kg)   Last Height:   Ht Readings from Last 1 Encounters:  06/23/22 6' (1.829 m)    Exam: NAD, pleasant                  Speech:    Speech is normal; fluent and spontaneous with normal comprehension.  Cognition:    The patient is oriented to person, place, and time;     recent and remote memory intact;     language fluent;    Cranial Nerves:    The pupils are  equal, round, and reactive to light.Trigeminal sensation is intact and the muscles of mastication are normal. The face is symmetric. The palate elevates in the midline. Hearing intact. Voice is normal. Shoulder shrug is normal. The tongue has normal motion without fasciculations.   Coordination:  No dysmetria  Motor Observation:    No asymmetry, no atrophy, and no involuntary movements noted. Tone:    Normal muscle tone.     Strength:  Strength is V/V in the upper and lower limbs.      Sensation: intact to LT     Assessment/Plan:  32 year old with chronic migraines. Has tried and failed multiple medications over the years. Worsening. Needs thorough examination. Started him on Ajovy. He loved Ajovy, his headaches and migraines completely went away but he did not pick it up from the pharmacy will re-prescribe and check up on him, > 8 migraine days a month moderate to severe. Worsening. Other days has milder 4/10 headaches. >15 headache days a month total. Ongoing years.   MRI brain unreamrkable  Patient instructions:  MRI of the brain w/wo contrast: unremarkable Prevention: Ajovy (trained in the office on injection use) gave 3 samples As needed: Rizatriptan for acute/emergent use: Please take one tablet at the onset of your headache. If it does not improve the symptoms please take one additional tablet in 2 hours. Do not take more then 2 tablets in 24hrs. Do not take use more then 2 to 3 times in a week.  From a thorough review of records, Medications tried that can be used in headache/migraine management include: Tylenol, amitriptyline(side effects), Norvasc (calcium channel blocker similar to verapamil hypotension), Flexeril, Voltaren, Advil, Toradol injections, Mobic tablets, methocarbamol tablets, Reglan injections, naproxen, promethazine injections,imitrex, topamax(tried as a child so likely had migraines had side effects). Aimovig contraindicated due to constipation, propranolol(and  other BP meds) contraindicated due to bradycardia when on it and hypotenstion, depakote  No orders of the defined types were placed in this encounter.   Cc: Westley Chandler, MD,  Westley Chandler, MD  Naomie Dean, MD  [facility] [address] [place], [place] [address]  Phone [phone] Fax [phone]  I spent over 30 minutes of face-to-face and non-face-to-face time with patient on the  No diagnosis found.  diagnosis.  This included previsit chart review, lab review, study review, order entry, electronic health record documentation, patient education on the different diagnostic and therapeutic options, counseling and coordination of care, risks and benefits of management, compliance, or risk factor reduction

## 2022-09-22 NOTE — Progress Notes (Signed)
    SUBJECTIVE:   Chief compliant/HPI: annual examination  Davidanthony Masino Keye is a 32 y.o. who presents today for an annual exam.   His only concern today is that twice over the last 2 days had blood in the stool.  This is usually around outside this though he is uncertain if it was associate with straining.  He has a strong family history of colorectal cancer in his grandfather in his early 46s.  Denies signs or symptoms of anemia.  Patient is smoking cannabis.  He does not drink excess alcohol.  He smokes 1 cigarette or less per day.  He sexually active.  3 partners in the last year.  He would like testing today.  Reviewed and updated history.   Review of systems form notable for negative for chest pain, headaches which are now pretty well-controlled, dyspnea, abdominal pain, constipation, diarrhea, dysuria or hematuria.    OBJECTIVE:   BP 131/68   Pulse 73   Temp 98.1 F (36.7 C) (Oral)   Ht 6' (1.829 m)   Wt 215 lb 12.8 oz (97.9 kg)   SpO2 100%   BMI 29.27 kg/m   HEENT: EOMI. Sclera without injection or icterus. MMM. External auditory canal examined and WNL (some cerumen). TM normal appearance, no erythema or bulging. Neck: Supple.  Cardiac: Regular rate and rhythm. Normal S1/S2. No murmurs, rubs, or gallops appreciated. Lungs: Clear bilaterally to ascultation.  Abdomen: Normoactive bowel sounds. No tenderness to deep or light palpation. No rebound or guarding.   Neuro: Normal speech Ext: No edema   Psych: Pleasant and appropriate   ASSESSMENT/PLAN:   No problem-specific Assessment & Plan notes found for this encounter.    Annual Examination  See AVS for age appropriate recommendations  PHQ score 0, reviewed and discussed.  Blood pressure reviewed and at goal.     Considered the following items based upon USPSTF recommendations: HIV testing: ordered Hepatitis C: ordered Hepatitis B: ordered Syphilis if at high risk: {ordered GC/CTordered Lipid panel (nonfasting  or fasting) discussed based upon AHA recommendations and ordered.  Consider repeat every 4-6 years.  Reviewed risk factors for latent tuberculosis and not indicated  Patient desires to return for A1C. Also GC/CT will need recollected. I have ordered as future. He knows he needs a visit for this (can be lab only)   Follow up in 1 year or sooner if indicated.    Westley Chandler, MD [facility]

## 2022-09-23 ENCOUNTER — Encounter: Payer: Self-pay | Admitting: Family Medicine

## 2022-09-23 ENCOUNTER — Ambulatory Visit (INDEPENDENT_AMBULATORY_CARE_PROVIDER_SITE_OTHER): Payer: Medicaid Other | Admitting: Family Medicine

## 2022-09-23 ENCOUNTER — Other Ambulatory Visit: Payer: Self-pay | Admitting: Family Medicine

## 2022-09-23 VITALS — BP 131/68 | HR 73 | Temp 98.1°F | Ht 72.0 in | Wt 215.8 lb

## 2022-09-23 DIAGNOSIS — Z0000 Encounter for general adult medical examination without abnormal findings: Secondary | ICD-10-CM

## 2022-09-23 DIAGNOSIS — K625 Hemorrhage of anus and rectum: Secondary | ICD-10-CM

## 2022-09-23 DIAGNOSIS — Z113 Encounter for screening for infections with a predominantly sexual mode of transmission: Secondary | ICD-10-CM

## 2022-09-23 NOTE — Patient Instructions (Signed)
It was wonderful to see you today.  Please bring ALL of your medications with you to every visit.   Today we talked about:   - You are doing great with your health  - We discussed reducing cannabis   - I have referred you to Gastroenterology  to further evaluate your concern. If you do not received a phone call about this appointment within 3-4 weeks, please call our office back at [phone]. Clemencia Course coordinates our referrals and can assist you in this.   -- We will check blood work today  Enjoy foot ball season   Please follow up in 12 months   Thank you for choosing [facility].   Please call [phone] with any questions about today's appointment.  Please be sure to schedule follow up at the front  desk before you leave today.   Terisa Starr, MD  Family Medicine

## 2022-09-24 LAB — CBC
Hematocrit: 42.7 % (ref 37.5–51.0)
Hemoglobin: 14.1 g/dL (ref 13.0–17.7)
MCH: 29.3 pg (ref 26.6–33.0)
MCHC: 33 g/dL (ref 31.5–35.7)
MCV: 89 fL (ref 79–97)
Platelets: 179 10*3/uL (ref 150–450)
RBC: 4.81 x10E6/uL (ref 4.14–5.80)
RDW: 13.1 % (ref 11.6–15.4)
WBC: 6.5 10*3/uL (ref 3.4–10.8)

## 2022-09-24 LAB — BASIC METABOLIC PANEL
BUN/Creatinine Ratio: 13 (ref 9–20)
BUN: 11 mg/dL (ref 6–20)
CO2: 20 mmol/L (ref 20–29)
Calcium: 9.2 mg/dL (ref 8.7–10.2)
Chloride: 104 mmol/L (ref 96–106)
Creatinine, Ser: 0.87 mg/dL (ref 0.76–1.27)
Glucose: 89 mg/dL (ref 70–99)
Potassium: 3.9 mmol/L (ref 3.5–5.2)
Sodium: 141 mmol/L (ref 134–144)
eGFR: 118 mL/min/{1.73_m2} (ref >59)

## 2022-09-24 LAB — LIPID PANEL
Chol/HDL Ratio: 4.1 ratio (ref 0.0–5.0)
Cholesterol, Total: 184 mg/dL (ref 100–199)
HDL: 45 mg/dL (ref >39)
LDL Chol Calc (NIH): 123 mg/dL — ABNORMAL HIGH (ref 0–99)
Triglycerides: 88 mg/dL (ref 0–149)
VLDL Cholesterol Cal: 16 mg/dL (ref 5–40)

## 2022-09-24 LAB — RPR: RPR Ser Ql: NONREACTIVE

## 2022-09-24 LAB — HCV INTERPRETATION

## 2022-09-24 LAB — HCV AB W REFLEX TO QUANT PCR: HCV Ab: NONREACTIVE

## 2022-09-24 LAB — HEPATITIS B SURFACE ANTIGEN: Hepatitis B Surface Ag: NEGATIVE

## 2022-09-24 LAB — HIV ANTIBODY (ROUTINE TESTING W REFLEX): HIV Screen 4th Generation wRfx: NONREACTIVE

## 2022-09-27 ENCOUNTER — Other Ambulatory Visit: Payer: Medicaid Other

## 2022-10-14 ENCOUNTER — Other Ambulatory Visit (HOSPITAL_COMMUNITY)
Admission: RE | Admit: 2022-10-14 | Discharge: 2022-10-14 | Disposition: A | Discharge status: Home / Self Care | Payer: Medicaid Other | Source: Ambulatory Visit | Attending: Family Medicine | Admitting: Family Medicine

## 2022-10-14 ENCOUNTER — Other Ambulatory Visit: Payer: Medicaid Other

## 2022-10-14 DIAGNOSIS — Z113 Encounter for screening for infections with a predominantly sexual mode of transmission: Secondary | ICD-10-CM

## 2022-10-14 NOTE — Progress Notes (Signed)
Urine cytology collected today. Patient declined A1c. Stephen Frank

## 2022-10-17 LAB — URINE CYTOLOGY ANCILLARY ONLY
Chlamydia: NEGATIVE
Comment: NEGATIVE
Comment: NORMAL
Neisseria Gonorrhea: NEGATIVE

## 2022-12-13 ENCOUNTER — Telehealth: Payer: Self-pay | Admitting: Neurology

## 2022-12-13 NOTE — Telephone Encounter (Signed)
Received approval for Ajovy 225mg  thru 12-13-2023 [facility] Medicaid [place] [id number]. I relayed to pt and he sill check with pharmacy to pick up.  I faxed approval to [phone].

## 2022-12-13 NOTE — Telephone Encounter (Signed)
Pt stated headaches are back and more severe for the last week or two. Has been taking tylenol, excedrin, etc. Would like to get a refill for Fremanezumab-vfrm (AJOVY) 225 MG/1.5ML SOAJ if possible since the next availability isn't until 04/2023. Would like a call back.

## 2022-12-13 NOTE — Telephone Encounter (Signed)
I called pt and he is asking to prescription for ajovy at his pharmacy.  He has yearly prescription from 02/2022.  He may need PA as last PA was up in 05-2022.  He would like a sample if we have to get by.  I told him that will do PA and if sample available and ok with provider will let him know.  I relayed about taking OTC headache meds can cause rebound if taking more then prescribed.  He verbalized understanding. He has same insurance.  ReAuthorization started in [facility] (Key: [id number]).  Determination pending.

## 2023-01-04 ENCOUNTER — Ambulatory Visit: Payer: Medicaid Other | Admitting: Family Medicine

## 2023-01-04 NOTE — Progress Notes (Unsigned)
    SUBJECTIVE:   CHIEF COMPLAINT: check up  HPI:   Stephen Frank is a 32 y.o.  with history notable for migraines  presenting for follow up.  At his last visit, he was referred to GI. He reports ***.  In terms of smoking ***.     PERTINENT  PMH / PSH/Family/Social History : ***  OBJECTIVE:   There were no vitals taken for this visit.  Today's weight:  Review of prior weights: There were no vitals filed for this visit.  ***  ASSESSMENT/PLAN:   Assessment & Plan Screening examination for STI    Terisa Starr, MD  Family Medicine Teaching Service  [facility]

## 2023-03-01 ENCOUNTER — Ambulatory Visit: Payer: Medicaid Other | Admitting: Neurology

## 2023-03-01 ENCOUNTER — Encounter: Payer: Self-pay | Admitting: Neurology

## 2023-03-01 NOTE — Progress Notes (Deleted)
 [facility]    Provider:  Dr Ines Requesting Provider: Delores Suzann HERO, MD Primary Care Provider:  Delores Suzann HERO, MD  CC:  Migraines  HPI:  Stephen Frank is a 33 y.o. male here as requested by Delores Suzann HERO, MD for chronic headache 4-5 years. Started him on Ajovy . He loved Ajovy , his headaches and migraines completely went away but he did not pick it up from the pharmacy will re-prescribe and check up on him,  MRI brain 02/01/2022: personally reviewed images and agree Narrative & Impression    [facility] [address] [place], [place] [address] [phone]   NEUROIMAGING REPORT     STUDY DATE: 03/11/2021 PATIENT NAME: Stephen Frank DOB: [birth year] MRN: [id number]   EXAM: MRI Brain with and without contrast   ORDERING CLINICIAN: Onetha Ines, MD CLINICAL HISTORY: 33 year old man with headaches COMPARISON FILMS: None   TECHNIQUE:MRI of the brain with and without contrast was obtained utilizing 5 mm axial slices with T1, T2, T2 flair, SWI and diffusion weighted views.  T1 sagittal, T2 coronal and postcontrast views in the axial and coronal plane were obtained.  CONTRAST: 20 ml Multihance  IMAGING SITE: [facility], [address] [place], [place], [place]    FINDINGS: On sagittal images, the spinal cord is imaged caudally to C2-C3 and is normal in caliber.   The cerebellar tonsils extend 2 mm below the foramen magnum, not enough to be considered a Chiari malformation..  Adjacent brain has normal signal.  The pituitary gland and optic chiasm appear normal.    Brain volume appears normal.   The ventricles are normal in size and without distortion.  There are no abnormal extra-axial collections of fluid.     The cerebellum and brainstem appears normal.   The deep gray matter appears normal.  The cerebral hemispheres appear normal.   Diffusion weighted images are normal.  Susceptibility weighted images are normal.      The  orbits appear normal.   The VIIth/VIIIth nerve complex appears normal.  The mastoid air cells appear normal.   There is minimal mucoperiosteal thickening in the left frontal sinus.  The other paranasal sinuses appear normal.  Flow voids are identified within the major intracerebral arteries.     After the infusion of contrast material, a normal enhancement pattern is noted.     IMPRESSION: This MRI of the brain with and without contrast shows the following: 1.   There were no acute findings.  Normal enhancement pattern. 2.   Brain parenchyma appears normal. 3.   The cerebellar tonsils extend 2 mm below the foramen magnum.  This is not enough to be considered a Chiari malformation and is unlikely to be clinically significant.   Patient complains of symptoms per HPI as well as the following symptoms: migraines . Pertinent negatives and positives per HPI. All others negative    Headaches started 5+ years ago. 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 He does take energy supplements, discussed  the risks including strokes, headaches of taking energy drinks. Tylenol , excedrin may help a little no medication overuse. SOund, light are triggers. Behind the eyes, eye pain. 8 migraine days a month moderate to severe. Worsening. Other days has milder 4/10 headaches. 15 headache days a month total. No other focal neurologic deficits, associated symptoms, inciting events or modifiable factors.   Reviewed notes, labs and imaging from  outside physicians, which showed:   From a thorough review of records, Medications tried that can be used in headache/migraine management include: Tylenol , amitriptyline (side effects), Norvasc  (calcium channel blocker similar to verapamil hypotension), Flexeril , Voltaren , Advil , Toradol  injections, Mobic  tablets, methocarbamol  tablets, Reglan  injections, naproxen , promethazine  injections,imitrex, topamax(tried as a child so likely had migraines had side effects). Aimovig contraindicated due to constipation, propranolol(and other BP meds) contraindicated due to bradycardia when on it and hypotenstion, depakote  XR cervical 04/02/2019: personally reviewed images and agree FINDINGS: There is no evidence of cervical spine fracture or prevertebral soft tissue swelling. Alignment is normal. No other significant bone abnormalities are identified.   IMPRESSION: Negative cervical spine radiographs.     Review of Systems: Patient complains of symptoms per HPI as well as the following symptoms headache. Pertinent negatives and positives per HPI. All others negative.   Social History   Socioeconomic History   Marital status: Single    Spouse name: Not on file   Number of children: Not on file   Years of education: 12 +   Highest education level: Not on file  Occupational History   Occupation: Truck Air Traffic Controller: [employer]  Tobacco Use   Smoking status: Every Day    Current packs/day: 0.25    Average packs/day: 0.3 packs/day for 8.5 years (2.1 ttl pk-yrs)    Types: Cigarettes    Start date: 08/25/2014   Smokeless tobacco: Never  Vaping Use   Vaping status: Never Used  Substance and Sexual Activity   Alcohol use: No    Alcohol/week: 0.0 standard drinks of alcohol   Drug use: Yes    Types: Marijuana    Comment: twice  to daily times per week.   Sexual activity: Yes    Birth control/protection: Condom  Other Topics Concern   Not on file  Social History Narrative   Single, truck  driver film/video editor)   R handed   Caffeine: 1 energy drink daily or every other day    Previously incarcerated    Smoker - marijuana   No EtOH, other drugs      Likes Therapist, Art    Social Drivers of Corporate Investment Banker Strain: Not on file  Food Insecurity: Not on file  Transportation Needs: Not on file  Physical Activity: Not on file  Stress: Not on file  Social Connections: Not on file  Intimate Partner Violence: Not on file    Family History  Problem Relation Age of Onset   Diabetes Mother    Hypertension Mother    Hyperlipidemia Father    Diabetes Maternal Grandmother    Colon cancer Maternal Grandfather 50   Hyperlipidemia Maternal Grandfather    Diabetes Maternal Grandfather    Pancreatic cancer Maternal Grandfather    Esophageal cancer Neg Hx    Rectal cancer Neg Hx    Stomach cancer Neg Hx    Prostate cancer Neg Hx     Past Medical History:  Diagnosis Date   ADHD (attention deficit hyperactivity disorder)    Allergy  Asthma    during childhood, improved as adult   Bipolar disorder (HCC)    GERD (gastroesophageal reflux disease) 04/25/2012   H/O chlamydia infection    History of seasonal allergies    Hypertension     Patient Active Problem List   Diagnosis Date Noted   Chronic migraine without aura without status migrainosus, not intractable 02/26/2021   Eosinophilia 07/22/2019   Cannabis use, uncomplicated 02/23/2018   Tobacco abuse 03/13/2017   Seasonal allergic rhinitis 05/08/2014   Bipolar disorder (HCC) 12/14/2011    Past Surgical History:  Procedure Laterality Date   ESOPHAGOGASTRODUODENOSCOPY     LAPAROSCOPIC APPENDECTOMY N/A 06/15/2022   Procedure: APPENDECTOMY LAPAROSCOPIC;  Surgeon: Vernetta Berg, MD;  Location: [facility];  Service: General;  Laterality: N/A;   TONSILLECTOMY AND ADENOIDECTOMY      Current Outpatient Medications  Medication Sig Dispense Refill   fexofenadine  (ALLEGRA ) 180 MG tablet  Take 1 tablet (180 mg total) by mouth daily. 30 tablet 2   Fremanezumab -vfrm (AJOVY ) 225 MG/1.5ML SOAJ Inject 225 mg into the skin every 30 (thirty) days. 1.5 mL 11   Fremanezumab -vfrm (AJOVY ) 225 MG/1.5ML SOAJ INJECT 225 MG INTO THE SKIN EVERY 30 (THIRTY) DAYS. 1.5 mL 11   guaiFENesin  200 MG tablet Take 1 tablet (200 mg total) by mouth every 4 (four) hours as needed for cough or to loosen phlegm. 30 suppository 0   No current facility-administered medications for this visit.    Allergies as of 03/01/2023 - Review Complete 09/23/2022  Allergen Reaction Noted   Bee venom Hives and Swelling 09/01/2015   Soy allergy (do not select)  08/26/2013    Vitals: There were no vitals taken for this visit. Last Weight:  Wt Readings from Last 1 Encounters:  09/23/22 215 lb 12.8 oz (97.9 kg)   Last Height:   Ht Readings from Last 1 Encounters:  09/23/22 6' (1.829 m)    Exam: NAD, pleasant                  Speech:    Speech is normal; fluent and spontaneous with normal comprehension.  Cognition:    The patient is oriented to person, place, and time;     recent and remote memory intact;     language fluent;    Cranial Nerves:    The pupils are equal, round, and reactive to light.Trigeminal sensation is intact and the muscles of mastication are normal. The face is symmetric. The palate elevates in the midline. Hearing intact. Voice is normal. Shoulder shrug is normal. The tongue has normal motion without fasciculations.   Coordination:  No dysmetria  Motor Observation:    No asymmetry, no atrophy, and no involuntary movements noted. Tone:    Normal muscle tone.     Strength:    Strength is V/V in the upper and lower limbs.      Sensation: intact to LT     Assessment/Plan:  33 year old with chronic migraines. Has tried and failed multiple medications over the years. Worsening. Needs thorough examination. Started him on Ajovy . He loved Ajovy , his headaches and migraines completely went  away but he did not pick it up from the pharmacy will re-prescribe and check up on him, > 8 migraine days a month moderate to severe. Worsening. Other days has milder 4/10 headaches. >15 headache days a month total. Ongoing years.   MRI brain unreamrkable  Patient instructions:  MRI of the brain w/wo contrast: unremarkable Prevention: Ajovy  (trained in the office  on injection use) gave 3 samples As needed: Rizatriptan  for acute/emergent use: Please take one tablet at the onset of your headache. If it does not improve the symptoms please take one additional tablet in 2 hours. Do not take more then 2 tablets in 24hrs. Do not take use more then 2 to 3 times in a week.  From a thorough review of records, Medications tried that can be used in headache/migraine management include: Tylenol , amitriptyline (side effects), Norvasc  (calcium channel blocker similar to verapamil hypotension), Flexeril , Voltaren , Advil , Toradol  injections, Mobic  tablets, methocarbamol  tablets, Reglan  injections, naproxen , promethazine  injections,imitrex, topamax(tried as a child so likely had migraines had side effects). Aimovig contraindicated due to constipation, propranolol(and other BP meds) contraindicated due to bradycardia when on it and hypotenstion, depakote  No orders of the defined types were placed in this encounter.   Cc: Delores Suzann HERO, MD,  Delores Suzann HERO, MD  Stephen Epp, MD  [facility] [address] [place], [place] [address]  Phone [phone] Fax [phone]  I spent over 30 minutes of face-to-face and non-face-to-face time with patient on the  No diagnosis found.  diagnosis.  This included previsit chart review, lab review, study review, order entry, electronic health record documentation, patient education on the different diagnostic and therapeutic options, counseling and coordination of care, risks and benefits of management, compliance, or risk factor  reduction

## 2023-03-07 ENCOUNTER — Telehealth: Payer: Self-pay | Admitting: Neurology

## 2023-03-07 NOTE — Telephone Encounter (Signed)
Pt reschedule no show appointment. Informed patient may be dismissed from the practice if have 3 no shows. Patient verbalized understand.

## 2023-03-10 ENCOUNTER — Other Ambulatory Visit (HOSPITAL_COMMUNITY)
Admission: RE | Admit: 2023-03-10 | Discharge: 2023-03-10 | Disposition: A | Discharge status: Home / Self Care | Payer: Medicaid Other | Source: Ambulatory Visit | Attending: Family Medicine | Admitting: Family Medicine

## 2023-03-10 ENCOUNTER — Ambulatory Visit (INDEPENDENT_AMBULATORY_CARE_PROVIDER_SITE_OTHER): Payer: Medicaid Other | Admitting: Student

## 2023-03-10 VITALS — BP 138/79 | HR 72 | Wt 214.0 lb

## 2023-03-10 DIAGNOSIS — J301 Allergic rhinitis due to pollen: Secondary | ICD-10-CM | POA: Diagnosis not present

## 2023-03-10 DIAGNOSIS — Z113 Encounter for screening for infections with a predominantly sexual mode of transmission: Secondary | ICD-10-CM | POA: Diagnosis present

## 2023-03-10 DIAGNOSIS — R7303 Prediabetes: Secondary | ICD-10-CM | POA: Diagnosis not present

## 2023-03-10 DIAGNOSIS — M549 Dorsalgia, unspecified: Secondary | ICD-10-CM | POA: Diagnosis not present

## 2023-03-10 LAB — POCT GLYCOSYLATED HEMOGLOBIN (HGB A1C): Hemoglobin A1C: 6 % — AB (ref 4.0–5.6)

## 2023-03-10 LAB — POCT URINALYSIS DIP (MANUAL ENTRY)
Bilirubin, UA: NEGATIVE
Blood, UA: NEGATIVE
Glucose, UA: NEGATIVE mg/dL
Ketones, POC UA: NEGATIVE mg/dL
Leukocytes, UA: NEGATIVE
Nitrite, UA: NEGATIVE
Protein Ur, POC: NEGATIVE mg/dL
Spec Grav, UA: 1.025 (ref 1.010–1.025)
Urobilinogen, UA: 0.2 U/dL
pH, UA: 6 (ref 5.0–8.0)

## 2023-03-10 MED ORDER — FLUTICASONE PROPIONATE 50 MCG/ACT NA SUSP
1.0000 | Freq: Every day | NASAL | 12 refills | Status: DC
Start: 2023-03-10 — End: 2023-11-29

## 2023-03-10 NOTE — Patient Instructions (Addendum)
It was great to see you today! Thank you for choosing [facility] for your primary care.  Today we addressed: We are screening for STDs today.  I have made you a lab appointment on Monday to come in have the rest of those performed. We checked her A1c today.  Please continue to work on exercise and dieting. Your low back pain is musculoskeletal.  Exercising more may actually help with this.  Tylenol and ibuprofen as needed. I sent in Flonase for your allergies.  If you haven't already, sign up for My Chart to have easy access to your labs results, and communication with your primary care physician.  Return if symptoms worsen or fail to improve. Please arrive 15 minutes before your appointment to ensure smooth check in process.  We appreciate your efforts in making this happen.  Thank you for allowing me to participate in your care, Shelby Mattocks, DO 03/10/2023, 5:12 PM PGY-3, [facility]

## 2023-03-10 NOTE — Assessment & Plan Note (Signed)
Rx for Flonase.  Continue Allegra.

## 2023-03-10 NOTE — Assessment & Plan Note (Addendum)
Last A1c 6.0.  Recheck A1c today was 6.0 as well.

## 2023-03-10 NOTE — Progress Notes (Signed)
  SUBJECTIVE:   CHIEF COMPLAINT / HPI:   Presents today requesting STD screening.  Denies dysuria or penile discharge.  He is sexually active and does not always use protection.  Also requests A1c today given he is prediabetic.  PERTINENT  PMH / PSH: Prediabetes  OBJECTIVE:  BP 138/79   Pulse 72   Wt 214 lb (97.1 kg)   SpO2 100%   BMI 29.02 kg/m  General: Well-appearing, NAD CV: RRR, murmurs auscultated Pulm: CTAB, normal WOB MSK: No CVA tenderness, normal range of motion in all directions regarding thoracic and lumbar spine  ASSESSMENT/PLAN:   Assessment & Plan Screen for STD (sexually transmitted disease) Asymptomatic.  Urine clean-catch and he will follow-up next week for blood testing. Acute bilateral back pain, unspecified back location No UTI, suspect MSK etiology.  I do not have concerns regarding his kidneys given absence of further symptomatology and previously normal labs. Seasonal allergic rhinitis due to pollen Rx for Flonase.  Continue Allegra. Prediabetes Last A1c 6.0.  Recheck A1c today was 6.0 as well. Return if symptoms worsen or fail to improve. Shelby Mattocks, DO 03/10/2023, 5:23 PM PGY-3, [facility]

## 2023-03-13 ENCOUNTER — Other Ambulatory Visit: Payer: Medicaid Other

## 2023-03-13 DIAGNOSIS — Z0000 Encounter for general adult medical examination without abnormal findings: Secondary | ICD-10-CM

## 2023-03-13 DIAGNOSIS — Z113 Encounter for screening for infections with a predominantly sexual mode of transmission: Secondary | ICD-10-CM

## 2023-03-14 ENCOUNTER — Encounter: Payer: Self-pay | Admitting: Student

## 2023-03-14 LAB — URINE CYTOLOGY ANCILLARY ONLY
Chlamydia: NEGATIVE
Comment: NEGATIVE
Comment: NEGATIVE
Comment: NORMAL
Neisseria Gonorrhea: NEGATIVE
Trichomonas: NEGATIVE

## 2023-03-14 LAB — HCV AB W REFLEX TO QUANT PCR: HCV Ab: NONREACTIVE

## 2023-03-14 LAB — HEPATITIS B SURFACE ANTIGEN: Hepatitis B Surface Ag: NEGATIVE

## 2023-03-14 LAB — HCV INTERPRETATION

## 2023-03-14 LAB — HEMOGLOBIN A1C
Est. average glucose Bld gHb Est-mCnc: 128 mg/dL
Hgb A1c MFr Bld: 6.1 % — ABNORMAL HIGH (ref 4.8–5.6)

## 2023-03-14 LAB — RPR: RPR Ser Ql: NONREACTIVE

## 2023-03-14 LAB — HIV ANTIBODY (ROUTINE TESTING W REFLEX): HIV Screen 4th Generation wRfx: NONREACTIVE

## 2023-04-12 ENCOUNTER — Ambulatory Visit: Payer: Medicaid Other | Admitting: Neurology

## 2023-05-30 ENCOUNTER — Encounter: Payer: Self-pay | Admitting: Neurology

## 2023-05-30 ENCOUNTER — Ambulatory Visit: Admitting: Neurology

## 2023-05-30 DIAGNOSIS — G43709 Chronic migraine without aura, not intractable, without status migrainosus: Secondary | ICD-10-CM | POA: Diagnosis not present

## 2023-05-30 MED ORDER — AJOVY 225 MG/1.5ML ~~LOC~~ SOAJ: 225.0000 mg | SUBCUTANEOUS | 11 refills | Status: DC

## 2023-05-30 NOTE — Progress Notes (Signed)
 [facility]    Provider:  Dr Tresia Fruit Requesting Provider: Azell Boll, MD Primary Care Provider:  Azell Boll, MD  CC:  Migraines  05/30/2023 Doing great on Ajovy , 80% improvement in migraines, doing great no bad side effects.will refill. Patient complains of symptoms per HPI as well as the following symptoms: none . Pertinent negatives and positives per HPI. All others negative   HPI 02/01/2022:  Stephen Frank is a 33 y.o. male here as requested by Stephen Boll, MD for chronic headache 4-5 years. Started him on Ajovy . He loved Ajovy , his headaches and migraines completely went away but he did not pick it up from the pharmacy will re-prescribe and check up on him,  MRI brain 02/01/2022: personally reviewed images and agree Narrative & Impression    [facility] [address] [place], [place] [address] [phone]   NEUROIMAGING REPORT     STUDY DATE: 03/11/2021 PATIENT NAME: Stephen Frank DOB: 02-23-1990 MRN: [id number]   EXAM: MRI Brain with and without contrast   ORDERING CLINICIAN: Aldona Amel, MD CLINICAL HISTORY: 33 year old man with headaches COMPARISON FILMS: None   TECHNIQUE:MRI of the brain with and without contrast was obtained utilizing 5 mm axial slices with T1, T2, T2 flair, SWI and diffusion weighted views.  T1 sagittal, T2 coronal and postcontrast views in the axial and coronal plane were obtained.  CONTRAST: 20 ml Multihance  IMAGING SITE: [facility], [address] [place], [place], [place]    FINDINGS: On sagittal images, the spinal cord is imaged caudally to C2-C3 and is normal in caliber.   The cerebellar tonsils extend 2 mm below the foramen magnum, not enough to be considered a Chiari malformation..  Adjacent brain has normal signal.  The pituitary gland and optic chiasm appear normal.    Brain volume appears normal.   The ventricles are normal in size and without distortion.  There are no abnormal  extra-axial collections of fluid.     The cerebellum and brainstem appears normal.   The deep gray matter appears normal.  The cerebral hemispheres appear normal.   Diffusion weighted images are normal.  Susceptibility weighted images are normal.      The orbits appear normal.   The VIIth/VIIIth nerve complex appears normal.  The mastoid air cells appear normal.   There is minimal mucoperiosteal thickening in the left frontal sinus.  The other paranasal sinuses appear normal.  Flow voids are identified within the major intracerebral arteries.     After the infusion of contrast material, a normal enhancement pattern is noted.     IMPRESSION: This MRI of the brain with and without contrast shows the following: 1.   There were no acute findings.  Normal enhancement pattern. 2.   Brain parenchyma appears normal. 3.   The cerebellar tonsils extend 2 mm below the foramen magnum.  This is not enough to be considered a Chiari malformation and is unlikely to be clinically significant.   Patient complains of symptoms per HPI as well as the following symptoms: migraines . Pertinent negatives and positives per HPI. All others negative    Headaches started 5+ years ago. 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 He does take energy supplements, discussed the risks including strokes, headaches of taking energy drinks. Tylenol , excedrin may help a little no medication overuse. SOund, light are triggers. Behind the eyes, eye pain. 8  migraine days a month moderate to severe. Worsening. Other days has milder 4/10 headaches. 15 headache days a month total. No other focal neurologic deficits, associated symptoms, inciting events or modifiable factors.   Reviewed notes, labs and imaging from outside physicians, which showed:   From a thorough review of records, Medications tried that can be used in headache/migraine management include: Tylenol , amitriptyline (side effects), Norvasc  (calcium channel blocker similar to verapamil hypotension), Flexeril , Voltaren , Advil , Toradol  injections, Mobic  tablets, methocarbamol  tablets, Reglan  injections, naproxen , promethazine  injections,imitrex, topamax(tried as a child so likely had migraines had side effects). Aimovig contraindicated due to constipation, propranolol(and other BP meds) contraindicated due to bradycardia when on it and hypotenstion, depakote  XR cervical 04/02/2019: personally reviewed images and agree FINDINGS: There is no evidence of cervical spine fracture or prevertebral soft tissue swelling. Alignment is normal. No other significant bone abnormalities are identified.   IMPRESSION: Negative cervical spine radiographs.     Review of Systems: Patient complains of symptoms per HPI as well as the following symptoms headache. Pertinent negatives and positives per HPI. All others negative.   Social History   Socioeconomic History   Marital status: Single    Spouse name: Not on file   Number of children: Not on file   Years of education: 12 +   Highest education level: Not on file  Occupational History   Occupation: Truck Air traffic controller: [employer]  Tobacco Use   Smoking status: Every Day    Current packs/day: 0.25    Average packs/day: 0.3 packs/day for 8.8 years (2.2 ttl pk-yrs)    Types: Cigarettes    Start date: 08/25/2014   Smokeless tobacco: Never  Vaping Use   Vaping status: Never Used  Substance and Sexual Activity   Alcohol use: No     Alcohol/week: 0.0 standard drinks of alcohol   Drug use: Yes    Types: Marijuana    Comment: twice  to daily times per week.   Sexual activity: Yes    Birth control/protection: Condom  Other Topics Concern   Not on file  Social History Narrative   Single, truck driver Film/video editor)   R handed   Caffeine: 1 energy drink daily or every other day    Previously incarcerated    Smoker - marijuana   No EtOH, other drugs      Likes Therapist, art    Social Drivers of Corporate investment banker Strain: Not on file  Food Insecurity: Not on file  Transportation Needs: Not on file  Physical Activity: Not on file  Stress: Not on file  Social Connections: Not on file  Intimate Partner Violence: Not on file    Family History  Problem Relation Age of Onset   Diabetes Mother    Hypertension Mother    Hyperlipidemia Father    Diabetes Maternal Grandmother    Colon cancer Maternal Grandfather 50   Hyperlipidemia Maternal Grandfather    Diabetes Maternal Grandfather    Pancreatic cancer Maternal Grandfather    Esophageal cancer Neg Hx  Rectal cancer Neg Hx    Stomach cancer Neg Hx    Prostate cancer Neg Hx     Past Medical History:  Diagnosis Date   ADHD (attention deficit hyperactivity disorder)    Allergy    Asthma    during childhood, improved as adult   Bipolar disorder (HCC)    GERD (gastroesophageal reflux disease) 04/25/2012   H/O chlamydia infection    History of seasonal allergies    Hypertension     Patient Active Problem List   Diagnosis Date Noted   Prediabetes 03/10/2023   Chronic migraine without aura without status migrainosus, not intractable 02/26/2021   Eosinophilia 07/22/2019   Cannabis use, uncomplicated 02/23/2018   Tobacco abuse 03/13/2017   Seasonal allergic rhinitis 05/08/2014   Bipolar disorder (HCC) 12/14/2011    Past Surgical History:  Procedure Laterality Date   ESOPHAGOGASTRODUODENOSCOPY     LAPAROSCOPIC  APPENDECTOMY N/A 06/15/2022   Procedure: APPENDECTOMY LAPAROSCOPIC;  Surgeon: Oza Blumenthal, MD;  Location: [facility];  Service: General;  Laterality: N/A;   TONSILLECTOMY AND ADENOIDECTOMY      Current Outpatient Medications  Medication Sig Dispense Refill   fexofenadine  (ALLEGRA ) 180 MG tablet Take 1 tablet (180 mg total) by mouth daily. 30 tablet 2   fluticasone  (FLONASE ) 50 MCG/ACT nasal spray Place 1 spray into both nostrils daily. 1 spray in each nostril every day 16 g 12   Fremanezumab -vfrm (AJOVY ) 225 MG/1.5ML SOAJ INJECT 225 MG INTO THE SKIN EVERY 30 (THIRTY) DAYS. (Patient not taking: Reported on 05/30/2023) 1.5 mL 11   Fremanezumab -vfrm (AJOVY ) 225 MG/1.5ML SOAJ Inject 225 mg into the skin every 30 (thirty) days. 1.5 mL 11   No current facility-administered medications for this visit.    Allergies as of 05/30/2023 - Review Complete 05/30/2023  Allergen Reaction Noted   Bee venom Hives and Swelling 09/01/2015   Soy allergy (obsolete)  08/26/2013    Vitals: BP 138/84   Pulse 70   Ht 6' (1.829 m)   Wt 215 lb 9.6 oz (97.8 kg)   BMI 29.24 kg/m  Last Weight:  Wt Readings from Last 1 Encounters:  05/30/23 215 lb 9.6 oz (97.8 kg)   Last Height:   Ht Readings from Last 1 Encounters:  05/30/23 6' (1.829 m)    Physical exam: Exam: Gen: NAD, conversant      CV: No palpitations or chest pain or SOB. VS: Breathing at a normal rate. Weight appears within normal limits. Not febrile. Eyes: Conjunctivae clear without exudates or hemorrhage  Neuro: Detailed Neurologic Exam  Speech:    Speech is normal; fluent and spontaneous with normal comprehension.  Cognition:    The patient is oriented to person, place, and time;     recent and remote memory intact;     language fluent;     normal attention, concentration, fund of knowledge Cranial Nerves:    The pupils are equal, round, and reactive to light. Visual fields are full Extraocular movements are intact.  The face is  symmetric with normal sensation. The palate elevates in the midline. Hearing intact. Voice is normal. Shoulder shrug is normal. The tongue has normal motion without fasciculations.   Coordination: normal  Gait:    No abnormalities noted or reported  Motor Observation:   no involuntary movements noted. Tone:    Appears normal  Posture:    Posture is normal. normal erect    Strength:    Strength is anti-gravity and symmetric in the upper and lower limbs.  Sensation: intact to LT, no reports of numbness or tingling or paresthesias          Assessment/Plan:  33 year old with chronic migraines. Has tried and failed multiple medications over the years. EXCELLENT response to Ajovy , been on 18 months approx and doing spectacular. Continue  Meds ordered this encounter  Medications   Fremanezumab -vfrm (AJOVY ) 225 MG/1.5ML SOAJ    Sig: Inject 225 mg into the skin every 30 (thirty) days.    Dispense:  1.5 mL    Refill:  11     MRI brain unreamrkable  Patient instructions:  MRI of the brain w/wo contrast: unremarkable As needed: Rizatriptan  for acute/emergent use: Please take one tablet at the onset of your headache. If it does not improve the symptoms please take one additional tablet in 2 hours. Do not take more then 2 tablets in 24hrs. Do not take use more then 2 to 3 times in a week.  From a thorough review of records, Medications tried that can be used in headache/migraine management include: Tylenol , amitriptyline (side effects), Norvasc  (calcium channel blocker similar to verapamil hypotension), Flexeril , Voltaren , Advil , Toradol  injections, Mobic  tablets, methocarbamol  tablets, Reglan  injections, naproxen , promethazine  injections,imitrex, topamax(tried as a child so likely had migraines had side effects). Aimovig contraindicated due to constipation, propranolol(and other BP meds) contraindicated due to bradycardia when on it and hypotenstion, depakote  Meds ordered this  encounter  Medications   Fremanezumab -vfrm (AJOVY ) 225 MG/1.5ML SOAJ    Sig: Inject 225 mg into the skin every 30 (thirty) days.    Dispense:  1.5 mL    Refill:  11    Cc: Stephen Boll, MD,  Stephen Boll, MD  Aldona Amel, MD  [facility] [address] [place], [place] [address]  Phone [phone] Fax [phone]  I spent of face-to-face and non-face-to-face time with patient on the  1. Chronic migraine without aura without status migrainosus, not intractable     diagnosis.  This included previsit chart review, lab review, study review, order entry, electronic health record documentation, patient education on the different diagnostic and therapeutic options, counseling and coordination of care, risks and benefits of management, compliance, or risk factor reduction

## 2023-08-14 ENCOUNTER — Ambulatory Visit

## 2023-08-18 ENCOUNTER — Other Ambulatory Visit (HOSPITAL_COMMUNITY)
Admission: RE | Admit: 2023-08-18 | Discharge: 2023-08-18 | Disposition: A | Discharge status: Home / Self Care | Source: Ambulatory Visit | Attending: Family Medicine | Admitting: Family Medicine

## 2023-08-18 ENCOUNTER — Ambulatory Visit (INDEPENDENT_AMBULATORY_CARE_PROVIDER_SITE_OTHER): Admitting: Family Medicine

## 2023-08-18 ENCOUNTER — Encounter: Payer: Self-pay | Admitting: Family Medicine

## 2023-08-18 DIAGNOSIS — Z113 Encounter for screening for infections with a predominantly sexual mode of transmission: Secondary | ICD-10-CM | POA: Insufficient documentation

## 2023-08-18 DIAGNOSIS — R7303 Prediabetes: Secondary | ICD-10-CM

## 2023-08-18 NOTE — Progress Notes (Signed)
    SUBJECTIVE:   CHIEF COMPLAINT / HPI:   Routine STI/STD testing  -would like routine testing today  -no discharge, pain, redness, urinary changes   Prediabetes -would like to check A1c today -has FMH of diabetes  -has been making healthy diet changes and exercising   PERTINENT  PMH / PSH: Prediabetes   OBJECTIVE:   BP 126/87   Pulse 75   Ht 6' (1.829 m)   Wt 219 lb 3.2 oz (99.4 kg)   SpO2 99%   BMI 29.73 kg/m   General: Well-appearing. Resting comfortably in room. CV: Normal S1/S2. No extra heart sounds. Warm and well-perfused. Pulm: Breathing comfortably on room air. CTAB. No increased WOB. Abd: Soft, non-tender, non-distended. Skin:  Warm, dry. Psych: Pleasant and appropriate.    ASSESSMENT/PLAN:   Assessment & Plan Routine screening for STI (sexually transmitted infection) Asymptomatic at this time.  - G/C/Trich urine - HIV/RPR  Prediabetes Hx prediabetes with A1c 6.0 in Feb 2025.  - A1c today - Encouraged continued healthy diet and exercise    RTC as needed.   Damien Cassis, MD [facility]  Addendum: Upon closing chart, notified that A1c was not collected. Canceled order in order to close encounter. Plan for A1c check at next visit.

## 2023-08-18 NOTE — Patient Instructions (Signed)
 Thank you for visiting clinic today and allowing us  to participate in your care!  We completed routine testing today and I will contact you with the results.   We are also checking your A1c today and I will contact you with the result. In the meantime, keep up the great work with healthy eating and exercising! Try your best to cut back on sweet tea - everything is okay in moderation!   Please schedule an appointment as needed with your PCP.    Reach out any time with any questions or concerns you may have - we are here for you!  Damien Cassis, MD [facility] [phone]

## 2023-08-19 LAB — RPR: RPR Ser Ql: NONREACTIVE

## 2023-08-19 LAB — HIV ANTIBODY (ROUTINE TESTING W REFLEX): HIV Screen 4th Generation wRfx: NONREACTIVE

## 2023-08-20 NOTE — Assessment & Plan Note (Signed)
 Hx prediabetes with A1c 6.0 in Feb 2025.  - A1c today - Encouraged continued healthy diet and exercise

## 2023-08-22 ENCOUNTER — Ambulatory Visit: Payer: Self-pay | Admitting: Family Medicine

## 2023-08-22 LAB — URINE CYTOLOGY ANCILLARY ONLY
Chlamydia: NEGATIVE
Comment: NEGATIVE
Comment: NEGATIVE
Comment: NORMAL
Neisseria Gonorrhea: NEGATIVE
Trichomonas: NEGATIVE

## 2023-10-17 ENCOUNTER — Telehealth: Payer: Self-pay | Admitting: Neurology

## 2023-10-17 NOTE — Telephone Encounter (Signed)
 Dr Ines sent a year's worth of refills back in May. Please call patient and ask him to call his pharmacy directly to talk to them and have it refilled.

## 2023-10-17 NOTE — Telephone Encounter (Signed)
 thanks

## 2023-10-17 NOTE — Telephone Encounter (Signed)
 Pt called to  request Medication refill    Fremanezumab -vfrm (AJOVY ) 225 MG/1.5ML SOAJ   Pt would like medication to be sent to   [id number] - [place], [place] - [address] (Ph: [phone])

## 2023-10-18 ENCOUNTER — Other Ambulatory Visit: Payer: Self-pay | Admitting: Neurology

## 2023-10-18 DIAGNOSIS — G43709 Chronic migraine without aura, not intractable, without status migrainosus: Secondary | ICD-10-CM

## 2023-11-29 ENCOUNTER — Other Ambulatory Visit (HOSPITAL_COMMUNITY)
Admission: RE | Admit: 2023-11-29 | Discharge: 2023-11-29 | Disposition: A | Discharge status: Home / Self Care | Source: Ambulatory Visit

## 2023-11-29 ENCOUNTER — Ambulatory Visit

## 2023-11-29 VITALS — BP 138/90 | HR 73 | Ht 72.0 in | Wt 221.0 lb

## 2023-11-29 DIAGNOSIS — R7303 Prediabetes: Secondary | ICD-10-CM

## 2023-11-29 DIAGNOSIS — Z113 Encounter for screening for infections with a predominantly sexual mode of transmission: Secondary | ICD-10-CM | POA: Insufficient documentation

## 2023-11-29 DIAGNOSIS — Z800 Family history of malignant neoplasm of digestive organs: Secondary | ICD-10-CM

## 2023-11-29 DIAGNOSIS — G43709 Chronic migraine without aura, not intractable, without status migrainosus: Secondary | ICD-10-CM

## 2023-11-29 DIAGNOSIS — F17200 Nicotine dependence, unspecified, uncomplicated: Secondary | ICD-10-CM | POA: Insufficient documentation

## 2023-11-29 DIAGNOSIS — S43422A Sprain of left rotator cuff capsule, initial encounter: Secondary | ICD-10-CM

## 2023-11-29 DIAGNOSIS — R030 Elevated blood-pressure reading, without diagnosis of hypertension: Secondary | ICD-10-CM

## 2023-11-29 NOTE — Assessment & Plan Note (Addendum)
 Repeat A1c due today. Discussed healthy diet, cutting out sugar drinks first

## 2023-11-29 NOTE — Assessment & Plan Note (Signed)
 Continue FU neurology

## 2023-11-29 NOTE — Assessment & Plan Note (Signed)
 Discussed cessation of tobacco and MJ

## 2023-11-29 NOTE — Progress Notes (Signed)
    SUBJECTIVE:   CHIEF COMPLAINT / HPI:   L Shoulder Pain - felt like he pulled a muscle in his shoulder a few days ago while moving furniture - Slightly better today. Has been taking ibuprofen  and tylenol .  - Pain with laying on shoulder  - Pain worst with lifting - No numbness or tingling.   Migraines - follows with neurology, taking Ajovy  - Does report improvement  PreDM2 - Last A1C 6.1 in Feb 2025 - Has a FM hx of DM - Reports high processed foods and fast foods - Usually drinks water. Doesn't really drink soda. He does drink fruit punch and sweet tea.   Tobacco Use disorder - due for pneumonia vaccine, declines today - Minimal tobacco use, mostly MJ  MJ use disorder -daily use  Blood in stool - hx of blood in stool, still happens on occasion. Per last chart review, he was sent to GI for this and FM of colon cancer but did not get appt. He would like another referral  Family Hx- Grandfather with colon cancer in 50s/60s.  PERTINENT  PMH / PSH: Migraines, PreDM  OBJECTIVE:   BP (!) 138/90 (BP Location: Left Arm, Patient Position: Sitting, Cuff Size: Large)   Pulse 73   Ht 6' (1.829 m)   Wt 221 lb (100.2 kg)   SpO2 100%   BMI 29.97 kg/m   Physical Exam General: Alert, conversant, cooperative. No acute distress.  HEENT: PERRL. EOMI. MMM.  Cardiovascular: RRR Respiratory: Lungs CTAB. Normal work of breathing. Extremities: No cyanosis. No edema Musculoskeletal: No gross deformities. TTP L Biceps tendon. Mild positive neer and hawkins. 5/5 strength and full sensation.  Skin: Warm. Dry. No rashes. No icterus.  Neurologic: No focal deficits. Moving all extremities. Psychiatric: Cooperative. Appropriate mood. Appropriate affect.   ASSESSMENT/PLAN:   Assessment & Plan Chronic migraine without aura without status migrainosus, not intractable Continue FU neurology Tobacco use disorder, moderate, dependence Discussed cessation of tobacco and  MJ Prediabetes Repeat A1c due today. Discussed healthy diet, cutting out sugar drinks first Routine screening for STI (sexually transmitted infection) No symptoms, requests G/C, HIV, RPR for routine screening Sprain of left rotator cuff capsule, initial encounter Mild, improving, recommend OTC tylneol and ibuprofen . FU if needed for more formal PT Family hx of colon cancer Will reorder GI referral per his request Elevated BP reading w/ no diagnosis of HTN Elevated x2. Will refer to Dr. Koval for ambulatory BP monitoring    Considered the following items based upon USPSTF recommendations: HIV testing: ordered Hepatitis C: neg Hepatitis B: low risk Syphilis if at high risk: ordered GC/C: ordered Lipid panel UTD   Milda LITTIE Deed, MD [facility]

## 2023-11-29 NOTE — Patient Instructions (Signed)
 It was good to see you today.   Please bring ALL of your medications with you to every visit.      Thank you for choosing [facility]. Please refer to your mychart for specifics regarding today's visit or future appointments.

## 2023-11-29 NOTE — Assessment & Plan Note (Signed)
 Elevated x2. Will refer to Dr. Koval for ambulatory BP monitoring

## 2023-11-30 ENCOUNTER — Telehealth: Payer: Self-pay | Admitting: Pharmacist

## 2023-11-30 LAB — LIPID PANEL
Chol/HDL Ratio: 4.4 ratio (ref 0.0–5.0)
Cholesterol, Total: 186 mg/dL (ref 100–199)
HDL: 42 mg/dL (ref >39)
LDL Chol Calc (NIH): 128 mg/dL — ABNORMAL HIGH (ref 0–99)
Triglycerides: 89 mg/dL (ref 0–149)
VLDL Cholesterol Cal: 16 mg/dL (ref 5–40)

## 2023-11-30 LAB — RPR: RPR Ser Ql: NONREACTIVE

## 2023-11-30 LAB — HIV ANTIBODY (ROUTINE TESTING W REFLEX): HIV Screen 4th Generation wRfx: NONREACTIVE

## 2023-11-30 NOTE — Telephone Encounter (Signed)
 Patient contacted for follow-up of referral for ambulatory blood pressure monitoring  Since last contact patient reports that he is available for next week  Appointment made for 11/13 at 8:30 am  Total time with patient call and documentation of interaction: 15 minutes.

## 2023-12-01 ENCOUNTER — Ambulatory Visit: Payer: Self-pay

## 2023-12-01 ENCOUNTER — Other Ambulatory Visit: Payer: Self-pay

## 2023-12-01 DIAGNOSIS — R7303 Prediabetes: Secondary | ICD-10-CM

## 2023-12-01 LAB — URINE CYTOLOGY ANCILLARY ONLY
Chlamydia: NEGATIVE
Comment: NEGATIVE
Comment: NEGATIVE
Comment: NORMAL
Neisseria Gonorrhea: NEGATIVE
Trichomonas: NEGATIVE

## 2023-12-01 NOTE — Progress Notes (Signed)
 A1C ordered

## 2023-12-07 ENCOUNTER — Ambulatory Visit: Payer: Self-pay | Admitting: Family Medicine

## 2023-12-07 ENCOUNTER — Encounter: Payer: Self-pay | Admitting: Pharmacist

## 2023-12-07 ENCOUNTER — Ambulatory Visit: Admitting: Pharmacist

## 2023-12-07 VITALS — BP 146/99 | HR 76 | Wt 219.6 lb

## 2023-12-07 DIAGNOSIS — R7303 Prediabetes: Secondary | ICD-10-CM

## 2023-12-07 DIAGNOSIS — R030 Elevated blood-pressure reading, without diagnosis of hypertension: Secondary | ICD-10-CM

## 2023-12-07 LAB — POCT GLYCOSYLATED HEMOGLOBIN (HGB A1C): HbA1c, POC (controlled diabetic range): 5.9 % (ref 0.0–7.0)

## 2023-12-07 NOTE — Progress Notes (Signed)
   S:     Chief Complaint  Patient presents with   Medication Management    Ambulatory Blood Pressure Day 1   33 y.o. male who presents for hypertension evaluation, education, and management. Patient arrives in good spirits and presents without  any assistance.   Patient was referred and last seen by Primary Care Provider, Dr. Dorethia, on 11/29/2023.  PMH is significant for tobacco use disorder, bipolar disorder, cannabis use, chronic migraine. FH: uncle and mother has HTN  Elevated Blood Pressure reading with no diagnosis of HTN  Medication compliance is reported to be good.  Discussed procedure for wearing the monitor and gave patient written instructions. Monitor was placed on non-dominant arm with instructions to return in the morning.   Current BP Medications include:  None  O:  Review of Systems  All other systems reviewed and are negative.   Physical Exam Constitutional:      Appearance: Normal appearance.  Pulmonary:     Effort: Pulmonary effort is normal.  Neurological:     Mental Status: He is alert.  Psychiatric:        Mood and Affect: Mood normal.        Behavior: Behavior normal.        Thought Content: Thought content normal.        Judgment: Judgment normal.    Last 3 Office BP readings: BP Readings from Last 3 Encounters:  12/07/23 (!) 146/99  11/29/23 (!) 138/90  08/18/23 126/87   Clinical Atherosclerotic Cardiovascular Disease (ASCVD): No  The ASCVD Risk score (Arnett DK, et al., 2019) failed to calculate for the following reasons:   The 2019 ASCVD risk score is only valid for ages 7 to 36  Basic Metabolic Panel    Component Value Date/Time   NA 141 09/23/2022 1652   K 3.9 09/23/2022 1652   CL 104 09/23/2022 1652   CO2 20 09/23/2022 1652   GLUCOSE 89 09/23/2022 1652   GLUCOSE 91 06/15/2022 0113   BUN 11 09/23/2022 1652   CREATININE 0.87 09/23/2022 1652   CREATININE 0.97 07/07/2014 1127   CALCIUM 9.2 09/23/2022 1652   GFRNONAA >60  06/15/2022 0113   GFRNONAA >89 07/07/2014 1127   GFRAA 123 07/22/2019 1051   GFRAA >89 07/07/2014 1127   ABPM Study Data: Arm Placement: left arm  For Office Goal BP of <130/80 mmHg:  ABPM thresholds: Overall BP <125/75 mmHg, daytime BP <130/80 mmHg, sleeptime BP <110/65 mmHg   A/P: History of elevated Blood Pressure reading without diagnosis of hypertension.Goal presssure of <130/80 mm Hg.     -Placed blood pressure cuff, provided education, patient instructed to wear cuff for 24 hours and return tomorrow to review results.  Pre-Diabetes - Patient requested A1C Discussed with PCP, Dr. Delores.  -A1C obtained - similar to previous at 5.9  (Will discuss result with patient at return visit - Day #2 Amb Blood Pressure visit tomorrow).    Written patient instructions provided including activity/symptom/event log. Patient verbalized understanding of plan. Total time in face to face counseling 25 minutes.    Follow-up: Tomorrow AM - early morning appointment 8:30 AM  Patient seen with Lawson Mao, PharmD Candidate - PY3 student and Recardo [employer] PharmD - PY4 Candidate.

## 2023-12-07 NOTE — Assessment & Plan Note (Signed)
 History of elevated Blood Pressure reading without diagnosis of hypertension.Goal presssure of <130/80 mm Hg.     -Placed blood pressure cuff, provided education, patient instructed to wear cuff for 24 hours and return tomorrow to review results.

## 2023-12-07 NOTE — Patient Instructions (Signed)

## 2023-12-08 ENCOUNTER — Ambulatory Visit (INDEPENDENT_AMBULATORY_CARE_PROVIDER_SITE_OTHER): Admitting: Pharmacist

## 2023-12-08 ENCOUNTER — Encounter: Payer: Self-pay | Admitting: Pharmacist

## 2023-12-08 VITALS — BP 142/85 | HR 72

## 2023-12-08 DIAGNOSIS — I10 Essential (primary) hypertension: Secondary | ICD-10-CM | POA: Diagnosis not present

## 2023-12-08 DIAGNOSIS — R030 Elevated blood-pressure reading, without diagnosis of hypertension: Secondary | ICD-10-CM

## 2023-12-08 MED ORDER — AMLODIPINE BESYLATE 5 MG PO TABS: 5.0000 mg | ORAL_TABLET | Freq: Every day | ORAL | 3 refills | Status: AC

## 2023-12-08 NOTE — Patient Instructions (Addendum)
 It was nice to see you today!  Thank you for completing the blood pressure monitoring evaluation.  Your goal blood pressure is < 130/80 mmHg   Medication Changes: START - amlodipine  5mg  once daily   Continue all other medication the same.

## 2023-12-08 NOTE — Progress Notes (Signed)
   S:     Chief Complaint  Patient presents with   Medication Management    Amb Blood Pressure Monitor Day #2   33 y.o. male who presents for hypertension evaluation, education, and management.  Patient arrives in good spirits and presents without any assistance. Patient returns to clinic with 24 hour blood pressure monitor and reports they were able to wear the ambulatory blood pressure cuff for the entire 24 evaluation period.    O:  Review of Systems  Neurological:  Positive for headaches (less severe with preventative therapy).  All other systems reviewed and are negative.   Physical Exam Vitals reviewed.  Constitutional:      Appearance: Normal appearance.  Pulmonary:     Effort: Pulmonary effort is normal.  Neurological:     Mental Status: He is alert.  Psychiatric:        Mood and Affect: Mood normal.        Behavior: Behavior normal.        Thought Content: Thought content normal.        Judgment: Judgment normal.     Last 3 Office BP readings: BP Readings from Last 3 Encounters:  12/08/23 (!) 142/85  12/07/23 (!) 146/99  11/29/23 (!) 138/90    Clinical Atherosclerotic Cardiovascular Disease (ASCVD): No    Basic Metabolic Panel    Component Value Date/Time   NA 141 09/23/2022 1652   K 3.9 09/23/2022 1652   CL 104 09/23/2022 1652   CO2 20 09/23/2022 1652   GLUCOSE 89 09/23/2022 1652   GLUCOSE 91 06/15/2022 0113   BUN 11 09/23/2022 1652   CREATININE 0.87 09/23/2022 1652   CREATININE 0.97 07/07/2014 1127   CALCIUM 9.2 09/23/2022 1652   GFRNONAA >60 06/15/2022 0113   GFRNONAA >89 07/07/2014 1127   GFRAA 123 07/22/2019 1051   GFRAA >89 07/07/2014 1127    Renal function: CrCl cannot be calculated (Patient's most recent lab result is older than the maximum 21 days allowed.).   ABPM Study Data: Arm Placement left arm  Overall Mean 24hr BP:   139/82 mmHg  HR: 71  Daytime Mean BP:  142/85 mmHg  HR: 72  Nighttime Mean BP:  129/70 mmHg  HR: 67   Dipping Pattern: Yes.    Sys:   9%   Dia: 17%  [normal dipping ~10-20%]   For Office Goal BP of <130/80 mmHg:  ABPM thresholds: Overall BP <125/75 mmHg, daytime BP <130/80 mmHg, sleeptime BP <110/65 mmHg    A/P: History of elevated blood pressure with in-office readings on multiple occasions.  Family history of hypertension.  Goal presssure of <130/80 mm Hg. Found to have persistently elevated and uncontrolled blood pressure with 24-hour ambulatory blood pressure evaluation which demonstrates an average AWAKE blood pressure of 142/85 mmHg. Nocturnal dipping pattern is normal.   Changes to medications - Started 5mg  amlodipine  daily.  Patient educated on proper use and potential adverse effects of peripheral edema.  Following instruction patient verbalized understanding of treatment plan.  Results reviewed and written information provided.     Total time in face to face counseling 11 minutes.    Follow-up:  Pharmacist None planned.  PCP clinic visit 01/12/2024 for Blood Pressure follow-up

## 2023-12-08 NOTE — Assessment & Plan Note (Signed)
 History of elevated blood pressure with in-office readings on multiple occasions.  Family history of hypertension.  Goal presssure of <130/80 mm Hg. Found to have persistently elevated and uncontrolled blood pressure with 24-hour ambulatory blood pressure evaluation which demonstrates an average AWAKE blood pressure of 142/85 mmHg. Nocturnal dipping pattern is normal.   Changes to medications - Started 5mg  amlodipine  daily.  Patient educated on proper use and potential adverse effects of peripheral edema.  Following instruction patient verbalized understanding of treatment plan.  Results reviewed and written information provided.

## 2023-12-10 NOTE — Progress Notes (Signed)
 Reviewed and agree with Dr Rennis plan.

## 2024-01-11 NOTE — Progress Notes (Deleted)
° ° °  SUBJECTIVE:   CHIEF COMPLAINT: *** HPI:   Stephen Frank is a 33 y.o.  with history notable for *** presenting for ***.   Discussed the use of AI scribe software for clinical note transcription with the patient, who gave verbal consent to proceed.  History of Present Illness      PERTINENT  PMH / PSH/Family/Social History : ***  OBJECTIVE:   There were no vitals taken for this visit.  Today's weight:  Review of prior weights: There were no vitals filed for this visit.  ***  ASSESSMENT/PLAN:   Assessment & Plan Primary hypertension     Suzann Daring, MD  Family Medicine Teaching Service  [facility]

## 2024-01-12 ENCOUNTER — Ambulatory Visit: Admitting: Family Medicine

## 2024-01-12 DIAGNOSIS — I10 Essential (primary) hypertension: Secondary | ICD-10-CM

## 2024-02-08 ENCOUNTER — Ambulatory Visit: Payer: Self-pay | Admitting: Family Medicine

## 2024-02-11 ENCOUNTER — Other Ambulatory Visit: Payer: Self-pay

## 2024-02-11 ENCOUNTER — Encounter (HOSPITAL_BASED_OUTPATIENT_CLINIC_OR_DEPARTMENT_OTHER): Payer: Self-pay

## 2024-02-11 ENCOUNTER — Emergency Department (HOSPITAL_BASED_OUTPATIENT_CLINIC_OR_DEPARTMENT_OTHER)
Admission: EM | Admit: 2024-02-11 | Discharge: 2024-02-11 | Disposition: A | Discharge status: Home / Self Care | Payer: Self-pay

## 2024-02-11 ENCOUNTER — Emergency Department (HOSPITAL_BASED_OUTPATIENT_CLINIC_OR_DEPARTMENT_OTHER): Payer: Self-pay | Admitting: Radiology

## 2024-02-11 DIAGNOSIS — S40012A Contusion of left shoulder, initial encounter: Secondary | ICD-10-CM | POA: Insufficient documentation

## 2024-02-11 DIAGNOSIS — Y92410 Unspecified street and highway as the place of occurrence of the external cause: Secondary | ICD-10-CM | POA: Insufficient documentation

## 2024-02-11 DIAGNOSIS — Z79899 Other long term (current) drug therapy: Secondary | ICD-10-CM | POA: Insufficient documentation

## 2024-02-11 DIAGNOSIS — I10 Essential (primary) hypertension: Secondary | ICD-10-CM | POA: Insufficient documentation

## 2024-02-11 DIAGNOSIS — S7002XA Contusion of left hip, initial encounter: Secondary | ICD-10-CM | POA: Insufficient documentation

## 2024-02-11 MED ORDER — METHOCARBAMOL 750 MG PO TABS: 750.0000 mg | ORAL_TABLET | Freq: Three times a day (TID) | ORAL | 0 refills | Status: AC | PRN

## 2024-02-11 MED ORDER — NAPROXEN 500 MG PO TABS: 500.0000 mg | ORAL_TABLET | Freq: Two times a day (BID) | ORAL | 0 refills | Status: DC

## 2024-02-11 NOTE — Discharge Instructions (Signed)
 Your x-rays did not show any concerning findings.  Please take the naproxen  twice daily.  Take the Robaxin  every 8 hours as needed for muscle spasm specifically in the neck.  Do not drive or drink alcohol taking this as it may make you drowsy.  Follow-up with your doctor and return to the ER for worsening symptoms.
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
## 2024-02-29 NOTE — Progress Notes (Unsigned)
 [facility]    SUBJECTIVE:   CHIEF COMPLAINT / HPI:   Discussed the use of AI scribe software for clinical note transcription with the patient, who gave verbal consent to proceed.  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  Hematospermia - Small amount of blood in semen recently - No associated pain or urinary symptoms - Concern due to family history of prostate cancer in grandfather   PERTINENT  PMH / PSH: Migraine, HTN, bipolar disorder  OBJECTIVE:   BP 137/82   Pulse 74   Ht 6' (1.829 m)   Wt 225 lb 9.6 oz (102.3 kg)   SpO2 99%   BMI 30.60 kg/m    General: NAD, pleasant, able to participate in exam MSK: Left shoulder: - Nontender to palpation over cervical spine - Tender to palpation over anterior glenohumeral joint - Positive Neer and Hawkins - Mildly decreased strength in left arm compared to right and shoulder flexion and abduction - Significant pain with internal rotation, - Neurovascularly intact distally, 2+ radial pulses bilaterally  ASSESSMENT/PLAN:   Assessment & Plan Chronic left shoulder pain Symptoms suggesting impingement syndrome and possible rotator cuff injury given weakness.  Patient requesting MRI, recommend referral to sports medicine with consideration of ultrasound evaluation.  Will treat symptomatically and recommend  work modifications as able. - Referred to sports medicine for consideration of additional imaging - Provided a letter for work to modify lifting restrictions to no more than 20 pounds. - Trial meloxicam  15 mg daily given lack of perceived benefit with ibuprofen /naproxen  Hematospermia 1 episode, minimal symptoms likely benign etiology.  Requesting STI testing which is obtained today. -Follow-up GC/CT, RPR and HIV    Dr. Izetta Nap, DO [facility]

## 2024-03-01 ENCOUNTER — Ambulatory Visit: Admitting: Family Medicine

## 2024-03-01 ENCOUNTER — Encounter: Payer: Self-pay | Admitting: Family Medicine

## 2024-03-01 VITALS — BP 137/82 | HR 74 | Ht 72.0 in | Wt 225.6 lb

## 2024-03-01 DIAGNOSIS — G8929 Other chronic pain: Secondary | ICD-10-CM

## 2024-03-01 DIAGNOSIS — R361 Hematospermia: Secondary | ICD-10-CM

## 2024-03-01 MED ORDER — MELOXICAM 15 MG PO TABS: 15.0000 mg | ORAL_TABLET | Freq: Every day | ORAL | 0 refills | Status: AC

## 2024-03-01 NOTE — Patient Instructions (Addendum)
 It was wonderful to see you today! Thank you for choosing [facility].   Today we talked about:  For your left shoulder as we discussed I do worry about something called impingement syndrome given you also have some weakness on that side there may be an injury to your rotator cuff.  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  I referred you to the sports medicine doctors, they can usually see you within the next week to consider doing an ultrasound and/or MRI.  Please take the meloxicam  daily for pain relief and you can also use topical lidocaine  patches that you can get from your pharmacy over the affected area. We are testing you for STIs, I will follow-up with you if any of these results are positive.  If you are noticing pain with urination or any other concerning symptoms such as scrotal pain you can always follow-up in our clinic for further evaluation and discussion.  Please follow up as needed for persistent symptoms   We are checking some labs today. If they are abnormal, I will call you. If they are normal, I will send you a MyChart message (if it is active) or a letter in the mail. If you do not hear about your labs in the next 2 weeks, please call the office.  Call the clinic at [phone] if your symptoms worsen or you have any concerns.  Please be sure to schedule follow up at the front desk before you leave today.   Izetta Nap, DO Family Medicine

## 2024-04-09 ENCOUNTER — Ambulatory Visit: Admitting: Internal Medicine

## 2024-05-08 ENCOUNTER — Telehealth: Admitting: Neurology
